# Patient Record
Sex: Male | Born: 1955 | Race: White | Hispanic: No | Marital: Married | Smoking: Former smoker
Health system: Southern US, Community
[De-identification: ages and names within clinical notes are randomized; demographics above are authoritative.]

## PROBLEM LIST (undated history)

## (undated) DIAGNOSIS — E785 Hyperlipidemia, unspecified: Secondary | ICD-10-CM

## (undated) DIAGNOSIS — K219 Gastro-esophageal reflux disease without esophagitis: Secondary | ICD-10-CM

## (undated) DIAGNOSIS — J449 Chronic obstructive pulmonary disease, unspecified: Secondary | ICD-10-CM

## (undated) DIAGNOSIS — J45909 Unspecified asthma, uncomplicated: Secondary | ICD-10-CM

## (undated) DIAGNOSIS — T7840XA Allergy, unspecified, initial encounter: Secondary | ICD-10-CM

## (undated) DIAGNOSIS — I10 Essential (primary) hypertension: Secondary | ICD-10-CM

## (undated) DIAGNOSIS — R519 Headache, unspecified: Secondary | ICD-10-CM

## (undated) HISTORY — DX: Allergy, unspecified, initial encounter: T78.40XA

## (undated) HISTORY — DX: Chronic obstructive pulmonary disease, unspecified: J44.9

## (undated) HISTORY — DX: Hyperlipidemia, unspecified: E78.5

## (undated) HISTORY — PX: OTHER SURGICAL HISTORY: SHX169

## (undated) HISTORY — DX: Essential (primary) hypertension: I10

## (undated) HISTORY — DX: Unspecified asthma, uncomplicated: J45.909

## (undated) HISTORY — PX: EYE SURGERY: SHX253

## (undated) HISTORY — DX: Gastro-esophageal reflux disease without esophagitis: K21.9

---

## 2007-05-11 HISTORY — PX: OTHER SURGICAL HISTORY: SHX169

## 2017-08-31 LAB — COLOGUARD: Cologuard: NEGATIVE

## 2019-05-29 ENCOUNTER — Ambulatory Visit: Payer: Self-pay | Admitting: Family Medicine

## 2019-06-06 ENCOUNTER — Ambulatory Visit: Payer: Self-pay | Admitting: Family Medicine

## 2019-06-18 ENCOUNTER — Other Ambulatory Visit: Payer: Self-pay

## 2019-06-18 ENCOUNTER — Encounter: Payer: Self-pay | Admitting: Family Medicine

## 2019-06-18 ENCOUNTER — Ambulatory Visit (INDEPENDENT_AMBULATORY_CARE_PROVIDER_SITE_OTHER): Payer: BC Managed Care – PPO | Admitting: Family Medicine

## 2019-06-18 VITALS — BP 169/94 | HR 80 | Temp 98.0°F | Ht 68.5 in | Wt 222.0 lb

## 2019-06-18 DIAGNOSIS — J449 Chronic obstructive pulmonary disease, unspecified: Secondary | ICD-10-CM | POA: Insufficient documentation

## 2019-06-18 DIAGNOSIS — J441 Chronic obstructive pulmonary disease with (acute) exacerbation: Secondary | ICD-10-CM | POA: Diagnosis not present

## 2019-06-18 DIAGNOSIS — K219 Gastro-esophageal reflux disease without esophagitis: Secondary | ICD-10-CM | POA: Diagnosis not present

## 2019-06-18 DIAGNOSIS — E782 Mixed hyperlipidemia: Secondary | ICD-10-CM

## 2019-06-18 DIAGNOSIS — E785 Hyperlipidemia, unspecified: Secondary | ICD-10-CM | POA: Insufficient documentation

## 2019-06-18 DIAGNOSIS — Z7689 Persons encountering health services in other specified circumstances: Secondary | ICD-10-CM | POA: Diagnosis not present

## 2019-06-18 DIAGNOSIS — J45909 Unspecified asthma, uncomplicated: Secondary | ICD-10-CM | POA: Insufficient documentation

## 2019-06-18 DIAGNOSIS — I10 Essential (primary) hypertension: Secondary | ICD-10-CM | POA: Diagnosis not present

## 2019-06-18 DIAGNOSIS — J4540 Moderate persistent asthma, uncomplicated: Secondary | ICD-10-CM

## 2019-06-18 NOTE — Assessment & Plan Note (Signed)
Stable and well controlled, continue current regimen 

## 2019-06-18 NOTE — Assessment & Plan Note (Signed)
Stable, prilosec controlling sxs well. Continue current regimen

## 2019-06-18 NOTE — Assessment & Plan Note (Signed)
BPs not at goal today, will start close home monitoring for the next 2 weeks and pt to call or send mychart message with readings in 2 weeks. Will adjust if persistently elevated. Continue current regimen in meantime and discussed DASH diet, stress reduction, increased exercise

## 2019-06-18 NOTE — Progress Notes (Signed)
BP (!) 169/94   Pulse 80   Temp 98 F (36.7 C) (Oral)   Ht 5' 8.5" (1.74 m)   Wt 222 lb (100.7 kg)   SpO2 98%   BMI 33.26 kg/m    Subjective:    Patient ID: Charles Bryant, male    DOB: 11-27-1955, 64 y.o.   MRN: [id number]  HPI: Charles Bryant is a 64 y.o. male  Chief Complaint  Patient presents with  . Establish Care   Patient presenting today to establish care.   Taking medications faithfully without side effects. Has not been checking home BPs but notes typically his BPs in clinic are normal. Denies CP, SOB, HAs, dizziness. Taking lipitor for HLD without issue. Denies claudication, myalgias. Trying to eat well and stay active. Prilosec managing GERD sxs well. COPD/asthma controlled on advair and prn albuterol. No recent exacerbations. Last labs were about 9 months ago. No new concerns today.   Relevant past medical, surgical, family and social history reviewed and updated as indicated. Interim medical history since our last visit reviewed. Allergies and medications reviewed and updated.  Review of Systems  Per HPI unless specifically indicated above     Objective:    BP (!) 169/94   Pulse 80   Temp 98 F (36.7 C) (Oral)   Ht 5' 8.5" (1.74 m)   Wt 222 lb (100.7 kg)   SpO2 98%   BMI 33.26 kg/m   Wt Readings from Last 3 Encounters:  06/18/19 222 lb (100.7 kg)    Physical Exam Vitals and nursing note reviewed.  Constitutional:      Appearance: Normal appearance.  HENT:     Head: Atraumatic.  Eyes:     Extraocular Movements: Extraocular movements intact.     Conjunctiva/sclera: Conjunctivae normal.  Cardiovascular:     Rate and Rhythm: Normal rate and regular rhythm.  Pulmonary:     Effort: Pulmonary effort is normal.     Breath sounds: Normal breath sounds.  Musculoskeletal:        General: Normal range of motion.     Cervical back: Normal range of motion and neck supple.  Skin:    General: Skin is warm and dry.  Neurological:     General: No focal deficit  present.     Mental Status: He is oriented to person, place, and time.  Psychiatric:        Mood and Affect: Mood normal.        Thought Content: Thought content normal.        Judgment: Judgment normal.     No results found for this or any previous visit.    Assessment & Plan:   Problem List Items Addressed This Visit      Cardiovascular and Mediastinum   Essential hypertension    BPs not at goal today, will start close home monitoring for the next 2 weeks and pt to call or send mychart message with readings in 2 weeks. Will adjust if persistently elevated. Continue current regimen in meantime and discussed DASH diet, stress reduction, increased exercise      Relevant Medications   metoprolol succinate (TOPROL-XL) 50 MG 24 hr tablet   atorvastatin (LIPITOR) 10 MG tablet   quinapril (ACCUPRIL) 20 MG tablet   hydrochlorothiazide (HYDRODIURIL) 12.5 MG tablet   Other Relevant Orders   Comprehensive metabolic panel     Respiratory   COPD (chronic obstructive pulmonary disease) (HCC)    Stable and well controlled, continue current regimen  Relevant Medications   Fluticasone-Salmeterol (ADVAIR) 500-50 MCG/DOSE AEPB   albuterol (PROAIR HFA) 108 (90 Base) MCG/ACT inhaler   Asthma    Stable and well controlled, continue current regimen      Relevant Medications   Fluticasone-Salmeterol (ADVAIR) 500-50 MCG/DOSE AEPB   albuterol (PROAIR HFA) 108 (90 Base) MCG/ACT inhaler     Digestive   GERD (gastroesophageal reflux disease)    Stable, prilosec controlling sxs well. Continue current regimen      Relevant Medications   omeprazole (PRILOSEC) 40 MG capsule     Other   Hyperlipidemia - Primary    Recheck lipids today, adjust as needed. Continue working on good lifestyle habits for increased control      Relevant Medications   metoprolol succinate (TOPROL-XL) 50 MG 24 hr tablet   atorvastatin (LIPITOR) 10 MG tablet   quinapril (ACCUPRIL) 20 MG tablet    hydrochlorothiazide (HYDRODIURIL) 12.5 MG tablet   Other Relevant Orders   Lipid Panel w/o Chol/HDL Ratio    Other Visit Diagnoses    Encounter to establish care           Follow up plan: Return in about 6 months (around 12/16/2019) for CPE.

## 2019-06-18 NOTE — Assessment & Plan Note (Signed)
Recheck lipids today, adjust as needed. Continue working on good lifestyle habits for increased control

## 2019-06-19 LAB — COMPREHENSIVE METABOLIC PANEL
ALT: 51 IU/L — ABNORMAL HIGH (ref 0–44)
AST: 37 IU/L (ref 0–40)
Albumin/Globulin Ratio: 1.8 (ref 1.2–2.2)
Albumin: 4.2 g/dL (ref 3.8–4.8)
Alkaline Phosphatase: 104 IU/L (ref 39–117)
BUN/Creatinine Ratio: 17 (ref 10–24)
BUN: 13 mg/dL (ref 8–27)
Bilirubin Total: <0.2 mg/dL (ref 0.0–1.2)
CO2: 26 mmol/L (ref 20–29)
Calcium: 9.7 mg/dL (ref 8.6–10.2)
Chloride: 100 mmol/L (ref 96–106)
Creatinine, Ser: 0.77 mg/dL (ref 0.76–1.27)
GFR calc Af Amer: 110 mL/min/{1.73_m2} (ref >59)
GFR calc non Af Amer: 95 mL/min/{1.73_m2} (ref >59)
Globulin, Total: 2.4 g/dL (ref 1.5–4.5)
Glucose: 112 mg/dL — ABNORMAL HIGH (ref 65–99)
Potassium: 4.2 mmol/L (ref 3.5–5.2)
Sodium: 142 mmol/L (ref 134–144)
Total Protein: 6.6 g/dL (ref 6.0–8.5)

## 2019-06-19 LAB — LIPID PANEL W/O CHOL/HDL RATIO
Cholesterol, Total: 184 mg/dL (ref 100–199)
HDL: 85 mg/dL (ref >39)
LDL Chol Calc (NIH): 85 mg/dL (ref 0–99)
Triglycerides: 79 mg/dL (ref 0–149)
VLDL Cholesterol Cal: 14 mg/dL (ref 5–40)

## 2019-06-29 ENCOUNTER — Encounter: Payer: Self-pay | Admitting: Family Medicine

## 2019-09-04 ENCOUNTER — Other Ambulatory Visit: Payer: Self-pay | Admitting: Family Medicine

## 2019-09-04 ENCOUNTER — Encounter: Payer: Self-pay | Admitting: Family Medicine

## 2019-09-04 MED ORDER — QUINAPRIL HCL 20 MG PO TABS: 20.0000 mg | ORAL_TABLET | Freq: Every day | ORAL | 1 refills | Status: DC

## 2019-09-04 MED ORDER — HYDROCHLOROTHIAZIDE 12.5 MG PO TABS: 12.5000 mg | ORAL_TABLET | Freq: Every day | ORAL | 1 refills | Status: DC

## 2019-09-04 MED ORDER — METOPROLOL SUCCINATE ER 50 MG PO TB24: 50.0000 mg | ORAL_TABLET | Freq: Every day | ORAL | 1 refills | Status: DC

## 2019-10-19 ENCOUNTER — Encounter: Payer: Self-pay | Admitting: Family Medicine

## 2019-12-17 ENCOUNTER — Encounter: Payer: BC Managed Care – PPO | Admitting: Family Medicine

## 2019-12-18 ENCOUNTER — Other Ambulatory Visit: Payer: Self-pay

## 2019-12-18 ENCOUNTER — Encounter: Payer: Self-pay | Admitting: Family Medicine

## 2019-12-18 ENCOUNTER — Ambulatory Visit (INDEPENDENT_AMBULATORY_CARE_PROVIDER_SITE_OTHER): Payer: Medicare Other | Admitting: Family Medicine

## 2019-12-18 VITALS — BP 140/86 | HR 88 | Temp 98.6°F | Ht 68.5 in | Wt 211.0 lb

## 2019-12-18 DIAGNOSIS — I10 Essential (primary) hypertension: Secondary | ICD-10-CM

## 2019-12-18 DIAGNOSIS — E782 Mixed hyperlipidemia: Secondary | ICD-10-CM | POA: Diagnosis not present

## 2019-12-18 DIAGNOSIS — K219 Gastro-esophageal reflux disease without esophagitis: Secondary | ICD-10-CM | POA: Diagnosis not present

## 2019-12-18 DIAGNOSIS — Z1211 Encounter for screening for malignant neoplasm of colon: Secondary | ICD-10-CM

## 2019-12-18 DIAGNOSIS — J441 Chronic obstructive pulmonary disease with (acute) exacerbation: Secondary | ICD-10-CM | POA: Diagnosis not present

## 2019-12-18 DIAGNOSIS — Z0000 Encounter for general adult medical examination without abnormal findings: Secondary | ICD-10-CM

## 2019-12-18 DIAGNOSIS — Z23 Encounter for immunization: Secondary | ICD-10-CM

## 2019-12-18 DIAGNOSIS — Z1159 Encounter for screening for other viral diseases: Secondary | ICD-10-CM

## 2019-12-18 DIAGNOSIS — J4540 Moderate persistent asthma, uncomplicated: Secondary | ICD-10-CM

## 2019-12-18 DIAGNOSIS — Z125 Encounter for screening for malignant neoplasm of prostate: Secondary | ICD-10-CM

## 2019-12-18 MED ORDER — ATORVASTATIN CALCIUM 10 MG PO TABS: 10.0000 mg | ORAL_TABLET | Freq: Every day | ORAL | 1 refills | Status: DC

## 2019-12-18 MED ORDER — FLUTICASONE-SALMETEROL 500-50 MCG/DOSE IN AEPB: 1.0000 | INHALATION_SPRAY | Freq: Two times a day (BID) | RESPIRATORY_TRACT | 5 refills | Status: DC

## 2019-12-18 MED ORDER — QUINAPRIL HCL 20 MG PO TABS: 20.0000 mg | ORAL_TABLET | Freq: Every day | ORAL | 1 refills | Status: DC

## 2019-12-18 MED ORDER — OMEPRAZOLE 40 MG PO CPDR: 40.0000 mg | DELAYED_RELEASE_CAPSULE | Freq: Every day | ORAL | 1 refills | Status: DC

## 2019-12-18 MED ORDER — TADALAFIL 20 MG PO TABS: 20.0000 mg | ORAL_TABLET | Freq: Every day | ORAL | 5 refills | Status: DC | PRN

## 2019-12-18 MED ORDER — ALBUTEROL SULFATE HFA 108 (90 BASE) MCG/ACT IN AERS: 1.0000 | INHALATION_SPRAY | Freq: Two times a day (BID) | RESPIRATORY_TRACT | 0 refills | Status: DC

## 2019-12-18 MED ORDER — HYDROCHLOROTHIAZIDE 12.5 MG PO TABS: 12.5000 mg | ORAL_TABLET | Freq: Every day | ORAL | 1 refills | Status: DC

## 2019-12-18 MED ORDER — METOPROLOL SUCCINATE ER 50 MG PO TB24: 50.0000 mg | ORAL_TABLET | Freq: Every day | ORAL | 1 refills | Status: DC

## 2019-12-18 NOTE — Progress Notes (Signed)
BP 140/86   Pulse 88   Temp 98.6 F (37 C) (Oral)   Ht 5' 8.5" (1.74 m)   Wt 211 lb (95.7 kg)   SpO2 95%   BMI 31.62 kg/m    Subjective:    Patient ID: Charles Bryant, male    DOB: 05/11/1955, 63 y.o.   MRN: [id number]  HPI: Charles Bryant is a 64 y.o. male presenting on 12/18/2019 for comprehensive medical examination. Current medical complaints include:see below  HTN - On quinapril, metoprolol and HCTZ regimen, checks bps occasionally at home and typically below 140s/90s range. Denies CP, SOB, HAs, dizziness. Tries to eat low sodium.   HLD - Taking lipitor, denies myalgias, CP, SOB, claudication.   Asthma, COPD - on advair and albuterol regimen which has worked well. No recent exacerbations, wheezing, CP, SOB.   ED - cialis as needed working well, no concerns  He currently lives with: Interim Problems from his last visit: no  Depression Screen done today and results listed below:  Depression screen [facility] 2/9 12/18/2019 06/18/2019  Decreased Interest 0 0  Down, Depressed, Hopeless 0 0  PHQ - 2 Score 0 0  Altered sleeping 2 1  Tired, decreased energy 0 0  Change in appetite 0 0  Feeling bad or failure about yourself  0 0  Trouble concentrating 0 0  Moving slowly or fidgety/restless 0 0  Suicidal thoughts 0 0  PHQ-9 Score 2 1    The patient does not have a history of falls. I did complete a risk assessment for falls. A plan of care for falls was documented.   Past Medical History:  Past Medical History:  Diagnosis Date  . Asthma   . COPD (chronic obstructive pulmonary disease) (HCC)   . GERD (gastroesophageal reflux disease)   . Hyperlipidemia   . Hypertension     Surgical History:  Past Surgical History:  Procedure Laterality Date  . carotid surgery      Medications:  No current outpatient medications on file prior to visit.   No current facility-administered medications on file prior to visit.    Allergies:  No Known Allergies  Social History:  Social  History   Socioeconomic History  . Marital status: Married    Spouse name: Not on file  . Number of children: Not on file  . Years of education: Not on file  . Highest education level: Not on file  Occupational History  . Not on file  Tobacco Use  . Smoking status: Former Games developer  . Smokeless tobacco: Never Used  Vaping Use  . Vaping Use: Never used  Substance and Sexual Activity  . Alcohol use: Not Currently  . Drug use: Never  . Sexual activity: Yes  Other Topics Concern  . Not on file  Social History Narrative  . Not on file   Social Determinants of Health   Financial Resource Strain:   . Difficulty of Paying Living Expenses:   Food Insecurity:   . Worried About Programme researcher, broadcasting/film/video in the Last Year:   . Barista in the Last Year:   Transportation Needs:   . Freight forwarder (Medical):   [name] Lack of Transportation (Non-Medical):   Physical Activity:   . Days of Exercise per Week:   . Minutes of Exercise per Session:   Stress:   . Feeling of Stress :   Social Connections:   . Frequency of Communication with Friends and Family:   . Frequency  of Social Gatherings with Friends and Family:   . Attends Religious Services:   . Active Member of Clubs or Organizations:   . Attends Banker Meetings:   [name] Marital Status:   Intimate Partner Violence:   . Fear of Current or Ex-Partner:   . Emotionally Abused:   [name] Physically Abused:   . Sexually Abused:    Social History   Tobacco Use  Smoking Status Former Smoker  Smokeless Tobacco Never Used   Social History   Substance and Sexual Activity  Alcohol Use Not Currently    Family History:  Family History  Problem Relation Age of Onset  . COPD Mother   . Cancer Father   . Stroke Maternal Grandfather   . Hyperkalemia Maternal Grandfather     Past medical history, surgical history, medications, allergies, family history and social history reviewed with patient today and changes made to  appropriate areas of the chart.   Review of Systems - General ROS: negative Psychological ROS: negative Ophthalmic ROS: negative ENT ROS: negative Allergy and Immunology ROS: negative Hematological and Lymphatic ROS: negative Endocrine ROS: negative Breast ROS: negative for breast lumps Respiratory ROS: no cough, shortness of breath, or wheezing Cardiovascular ROS: no chest pain or dyspnea on exertion Gastrointestinal ROS: no abdominal pain, change in bowel habits, or black or bloody stools Genito-Urinary ROS: no dysuria, trouble voiding, or hematuria Musculoskeletal ROS: negative Neurological ROS: no TIA or stroke symptoms Dermatological ROS: negative All other ROS negative except what is listed above and in the HPI.      Objective:    BP 140/86   Pulse 88   Temp 98.6 F (37 C) (Oral)   Ht 5' 8.5" (1.74 m)   Wt 211 lb (95.7 kg)   SpO2 95%   BMI 31.62 kg/m   Wt Readings from Last 3 Encounters:  12/18/19 211 lb (95.7 kg)  06/18/19 222 lb (100.7 kg)    Physical Exam Vitals and nursing note reviewed.  Constitutional:      General: He is not in acute distress.    Appearance: He is well-developed.  HENT:     Head: Atraumatic.     Right Ear: Tympanic membrane and external ear normal.     Left Ear: Tympanic membrane and external ear normal.     Nose: Nose normal.     Mouth/Throat:     Mouth: Mucous membranes are moist.     Pharynx: Oropharynx is clear.  Eyes:     General: No scleral icterus.    Conjunctiva/sclera: Conjunctivae normal.     Pupils: Pupils are equal, round, and reactive to light.  Cardiovascular:     Rate and Rhythm: Normal rate and regular rhythm.     Heart sounds: Normal heart sounds. No murmur heard.   Pulmonary:     Effort: Pulmonary effort is normal. No respiratory distress.     Breath sounds: Normal breath sounds.  Abdominal:     General: Bowel sounds are normal. There is no distension.     Palpations: Abdomen is soft. There is no mass.      Tenderness: There is no abdominal tenderness. There is no guarding.  Genitourinary:    Comments: GU exam declined with shared decision making Musculoskeletal:        General: No tenderness. Normal range of motion.     Cervical back: Normal range of motion and neck supple.  Skin:    General: Skin is warm and dry.  Findings: No rash.  Neurological:     General: No focal deficit present.     Mental Status: He is alert and oriented to person, place, and time.     Deep Tendon Reflexes: Reflexes are normal and symmetric.  Psychiatric:        Mood and Affect: Mood normal.        Behavior: Behavior normal.        Thought Content: Thought content normal.        Judgment: Judgment normal.     Results for orders placed or performed in visit on 12/18/19  Hepatitis C antibody  Result Value Ref Range   Hep C Virus Ab <0.1 0.0 - 0.9 s/co ratio  CBC with Differential/Platelet  Result Value Ref Range   WBC 5.6 3.4 - 10.8 x10E3/uL   RBC 4.57 4.14 - 5.80 x10E6/uL   Hemoglobin 14.4 13.0 - 17.7 g/dL   Hematocrit 11.9 14.7 - 51.0 %   MCV 94 79 - 97 fL   MCH 31.5 26.6 - 33.0 pg   MCHC 33.5 31 - 35 g/dL   RDW 82.9 56.2 - 13.0 %   Platelets 185 150 - 450 x10E3/uL   Neutrophils 56 Not Estab. %   Lymphs 28 Not Estab. %   Monocytes 9 Not Estab. %   Eos 5 Not Estab. %   Basos 1 Not Estab. %   Neutrophils Absolute 3.2 1 - 7 x10E3/uL   Lymphocytes Absolute 1.6 0 - 3 x10E3/uL   Monocytes Absolute 0.5 0 - 0 x10E3/uL   EOS (ABSOLUTE) 0.3 0.0 - 0.4 x10E3/uL   Basophils Absolute 0.1 0 - 0 x10E3/uL   Immature Granulocytes 1 Not Estab. %   Immature Grans (Abs) 0.0 0.0 - 0.1 x10E3/uL  Comprehensive metabolic panel  Result Value Ref Range   Glucose 107 (H) 65 - 99 mg/dL   BUN 12 8 - 27 mg/dL   Creatinine, Ser 8.65 0.76 - 1.27 mg/dL   GFR calc non Af Amer 93 >59 mL/min/1.73   GFR calc Af Amer 107 >59 mL/min/1.73   BUN/Creatinine Ratio 15 10 - 24   Sodium 140 134 - 144 mmol/L   Potassium 4.4 3.5 -  5.2 mmol/L   Chloride 98 96 - 106 mmol/L   CO2 26 20 - 29 mmol/L   Calcium 9.4 8.6 - 10.2 mg/dL   Total Protein 6.9 6.0 - 8.5 g/dL   Albumin 4.5 3.8 - 4.8 g/dL   Globulin, Total 2.4 1.5 - 4.5 g/dL   Albumin/Globulin Ratio 1.9 1.2 - 2.2   Bilirubin Total 0.5 0.0 - 1.2 mg/dL   Alkaline Phosphatase 96 48 - 121 IU/L   AST 43 (H) 0 - 40 IU/L   ALT 52 (H) 0 - 44 IU/L  Lipid Panel w/o Chol/HDL Ratio  Result Value Ref Range   Cholesterol, Total 200 (H) 100 - 199 mg/dL   Triglycerides 56 0 - 149 mg/dL   HDL 784 >69 mg/dL   VLDL Cholesterol Cal 10 5 - 40 mg/dL   LDL Chol Calc (NIH) 87 0 - 99 mg/dL  PSA  Result Value Ref Range   Prostate Specific Ag, Serum 0.9 0.0 - 4.0 ng/mL      Assessment & Plan:   Problem List Items Addressed This Visit      Cardiovascular and Mediastinum   Essential hypertension    BPs stable and under good control, continue prsent medications      Relevant Medications   tadalafil (CIALIS)  20 MG tablet   quinapril (ACCUPRIL) 20 MG tablet   metoprolol succinate (TOPROL-XL) 50 MG 24 hr tablet   hydrochlorothiazide (HYDRODIURIL) 12.5 MG tablet   atorvastatin (LIPITOR) 10 MG tablet   Other Relevant Orders   CBC with Differential/Platelet (Completed)   Comprehensive metabolic panel (Completed)     Respiratory   COPD (chronic obstructive pulmonary disease) (HCC)    Stable, continue inhaler regimen      Relevant Medications   Fluticasone-Salmeterol (ADVAIR) 500-50 MCG/DOSE AEPB   albuterol (PROAIR HFA) 108 (90 Base) MCG/ACT inhaler   Asthma    Stable, continue inhaler regimen      Relevant Medications   Fluticasone-Salmeterol (ADVAIR) 500-50 MCG/DOSE AEPB   albuterol (PROAIR HFA) 108 (90 Base) MCG/ACT inhaler     Digestive   GERD (gastroesophageal reflux disease)    Stable on PPI, continue present medications      Relevant Medications   omeprazole (PRILOSEC) 40 MG capsule     Other   Hyperlipidemia - Primary    Recheck lipids, adjust as needed.  Continue present medication       Relevant Medications   tadalafil (CIALIS) 20 MG tablet   quinapril (ACCUPRIL) 20 MG tablet   metoprolol succinate (TOPROL-XL) 50 MG 24 hr tablet   hydrochlorothiazide (HYDRODIURIL) 12.5 MG tablet   atorvastatin (LIPITOR) 10 MG tablet   Other Relevant Orders   Lipid Panel w/o Chol/HDL Ratio (Completed)    Other Visit Diagnoses    Annual physical exam       Screening for prostate cancer       Relevant Orders   PSA (Completed)   Need for pneumococcal vaccine       Relevant Orders   Pneumococcal conjugate vaccine 13-valent IM (Completed)   Screening for colon cancer       Relevant Orders   Cologuard   Need for hepatitis C screening test       Relevant Orders   Hepatitis C antibody (Completed)       Discussed aspirin prophylaxis for myocardial infarction prevention and decision was made to continue ASA  LABORATORY TESTING:  Health maintenance labs ordered today as discussed above.   The natural history of prostate cancer and ongoing controversy regarding screening and potential treatment outcomes of prostate cancer has been discussed with the patient. The meaning of a false positive PSA and a false negative PSA has been discussed. He indicates understanding of the limitations of this screening test and wishes to proceed with screening PSA testing.   IMMUNIZATIONS:   - Tdap: Tetanus vaccination status reviewed: postponed. - Influenza: Postponed to flu season - Pneumovax: Up to date - Prevnar: Up to date - HPV: Not applicable - Zostavax vaccine: Refused  SCREENING: - Colonoscopy: cologuard UTD  Discussed with patient purpose of the colonoscopy is to detect colon cancer at curable precancerous or early stages   PATIENT COUNSELING:    Sexuality: Discussed sexually transmitted diseases, partner selection, use of condoms, avoidance of unintended pregnancy  and contraceptive alternatives.   Advised to avoid cigarette smoking.  I discussed  with the patient that most people either abstain from alcohol or drink within safe limits (<=14/week and <=4 drinks/occasion for males, <=7/weeks and <= 3 drinks/occasion for females) and that the risk for alcohol disorders and other health effects rises proportionally with the number of drinks per week and how often a drinker exceeds daily limits.  Discussed cessation/primary prevention of drug use and availability of treatment for abuse.   Diet: Encouraged  to adjust caloric intake to maintain  or achieve ideal body weight, to reduce intake of dietary saturated fat and total fat, to limit sodium intake by avoiding high sodium foods and not adding table salt, and to maintain adequate dietary potassium and calcium preferably from fresh fruits, vegetables, and low-fat dairy products.    stressed the importance of regular exercise  Injury prevention: Discussed safety belts, safety helmets, smoke detector, smoking near bedding or upholstery.   Dental health: Discussed importance of regular tooth brushing, flossing, and dental visits.   Follow up plan: NEXT PREVENTATIVE PHYSICAL DUE IN 1 YEAR. Return in about 6 months (around 06/19/2020) for 6 month f/u.

## 2019-12-19 LAB — CBC WITH DIFFERENTIAL/PLATELET
Basophils Absolute: 0.1 10*3/uL (ref 0.0–0.2)
Basos: 1 %
EOS (ABSOLUTE): 0.3 10*3/uL (ref 0.0–0.4)
Eos: 5 %
Hematocrit: 43 % (ref 37.5–51.0)
Hemoglobin: 14.4 g/dL (ref 13.0–17.7)
Immature Grans (Abs): 0 10*3/uL (ref 0.0–0.1)
Immature Granulocytes: 1 %
Lymphocytes Absolute: 1.6 10*3/uL (ref 0.7–3.1)
Lymphs: 28 %
MCH: 31.5 pg (ref 26.6–33.0)
MCHC: 33.5 g/dL (ref 31.5–35.7)
MCV: 94 fL (ref 79–97)
Monocytes Absolute: 0.5 10*3/uL (ref 0.1–0.9)
Monocytes: 9 %
Neutrophils Absolute: 3.2 10*3/uL (ref 1.4–7.0)
Neutrophils: 56 %
Platelets: 185 10*3/uL (ref 150–450)
RBC: 4.57 x10E6/uL (ref 4.14–5.80)
RDW: 13.5 % (ref 11.6–15.4)
WBC: 5.6 10*3/uL (ref 3.4–10.8)

## 2019-12-19 LAB — COMPREHENSIVE METABOLIC PANEL
ALT: 52 IU/L — ABNORMAL HIGH (ref 0–44)
AST: 43 IU/L — ABNORMAL HIGH (ref 0–40)
Albumin/Globulin Ratio: 1.9 (ref 1.2–2.2)
Albumin: 4.5 g/dL (ref 3.8–4.8)
Alkaline Phosphatase: 96 IU/L (ref 48–121)
BUN/Creatinine Ratio: 15 (ref 10–24)
BUN: 12 mg/dL (ref 8–27)
Bilirubin Total: 0.5 mg/dL (ref 0.0–1.2)
CO2: 26 mmol/L (ref 20–29)
Calcium: 9.4 mg/dL (ref 8.6–10.2)
Chloride: 98 mmol/L (ref 96–106)
Creatinine, Ser: 0.81 mg/dL (ref 0.76–1.27)
GFR calc Af Amer: 107 mL/min/{1.73_m2} (ref >59)
GFR calc non Af Amer: 93 mL/min/{1.73_m2} (ref >59)
Globulin, Total: 2.4 g/dL (ref 1.5–4.5)
Glucose: 107 mg/dL — ABNORMAL HIGH (ref 65–99)
Potassium: 4.4 mmol/L (ref 3.5–5.2)
Sodium: 140 mmol/L (ref 134–144)
Total Protein: 6.9 g/dL (ref 6.0–8.5)

## 2019-12-19 LAB — HEPATITIS C ANTIBODY: Hep C Virus Ab: <0.1 s/co ratio (ref 0.0–0.9)

## 2019-12-19 LAB — LIPID PANEL W/O CHOL/HDL RATIO
Cholesterol, Total: 200 mg/dL — ABNORMAL HIGH (ref 100–199)
HDL: 103 mg/dL (ref >39)
LDL Chol Calc (NIH): 87 mg/dL (ref 0–99)
Triglycerides: 56 mg/dL (ref 0–149)
VLDL Cholesterol Cal: 10 mg/dL (ref 5–40)

## 2019-12-19 LAB — PSA: Prostate Specific Ag, Serum: 0.9 ng/mL (ref 0.0–4.0)

## 2019-12-23 NOTE — Assessment & Plan Note (Signed)
Stable on PPI, continue present medications. 

## 2019-12-23 NOTE — Assessment & Plan Note (Signed)
Stable, continue inhaler regimen 

## 2019-12-23 NOTE — Assessment & Plan Note (Signed)
BPs stable and under good control, continue prsent medications

## 2019-12-23 NOTE — Assessment & Plan Note (Signed)
Recheck lipids, adjust as needed. Continue present medication. 

## 2019-12-31 ENCOUNTER — Encounter: Payer: Self-pay | Admitting: Family Medicine

## 2020-02-18 ENCOUNTER — Other Ambulatory Visit: Payer: Self-pay | Admitting: Family Medicine

## 2020-04-25 ENCOUNTER — Other Ambulatory Visit: Payer: Self-pay | Admitting: Family Medicine

## 2020-05-23 ENCOUNTER — Telehealth: Payer: Self-pay

## 2020-05-23 ENCOUNTER — Ambulatory Visit (INDEPENDENT_AMBULATORY_CARE_PROVIDER_SITE_OTHER): Payer: Medicare Other

## 2020-05-23 VITALS — Ht 69.5 in | Wt 207.0 lb

## 2020-05-23 DIAGNOSIS — Z0000 Encounter for general adult medical examination without abnormal findings: Secondary | ICD-10-CM | POA: Diagnosis not present

## 2020-05-23 NOTE — Progress Notes (Signed)
I connected with  Charles Bryant today via telehealth video enabled device and verified that I am speaking with the correct person using two identifiers.   Location: Patient: home Provider: home  Persons participating in virtual visit: patient, provider  I discussed the limitations, risks, security and privacy concerns of performing an evaluation and management service by telephone and the availability of in person appointments. The patient expressed understanding and agreed to proceed.   Some vital signs may be absent or patient reported.       Subjective:   Charles Bryant is a 65 y.o. male who presents for an Initial Medicare Annual Wellness Visit.  Review of Systems     Cardiac Risk Factors include: advanced age (>46men, >38 women);dyslipidemia;hypertension;male gender;obesity (BMI >30kg/m2);sedentary lifestyle     Objective:    Today's Vitals   05/23/20 1301  Weight: 207 lb (93.9 kg)  Height: 5' 9.5" (1.765 m)   Body mass index is 30.13 kg/m.  Advanced Directives 05/23/2020  Does Patient Have a Medical Advance Directive? Yes  Type of Estate agent of [place];Living will  Copy of Healthcare Power of Attorney in Chart? No - copy requested    Current Medications (verified) Outpatient Encounter Medications as of 05/23/2020  Medication Sig  . albuterol (VENTOLIN HFA) 108 (90 Base) MCG/ACT inhaler INHALE 1 TO 2 INHALATIONS  BY MOUTH INTO THE LUNGS  TWICE DAILY  . atorvastatin (LIPITOR) 10 MG tablet TAKE 1 TABLET BY MOUTH  DAILY  . hydrochlorothiazide (HYDRODIURIL) 12.5 MG tablet Take 1 tablet (12.5 mg total) by mouth daily.  . metoprolol succinate (TOPROL-XL) 50 MG 24 hr tablet Take 1 tablet (50 mg total) by mouth daily. Take with or immediately following a meal.  . omeprazole (PRILOSEC) 40 MG capsule TAKE 1 CAPSULE BY MOUTH  DAILY  . quinapril (ACCUPRIL) 20 MG tablet Take 1 tablet (20 mg total) by mouth at bedtime.  . tadalafil (CIALIS) 20 MG tablet Take 1  tablet (20 mg total) by mouth daily as needed for erectile dysfunction.  Monte Fantasia INHUB 500-50 MCG/DOSE AEPB USE 1 INHALATION BY MOUTH  TWICE DAILY   No facility-administered encounter medications on file as of 05/23/2020.    Allergies (verified) Patient has no known allergies.   History: Past Medical History:  Diagnosis Date  . Asthma   . COPD (chronic obstructive pulmonary disease) (HCC)   . GERD (gastroesophageal reflux disease)   . Hyperlipidemia   . Hypertension    Past Surgical History:  Procedure Laterality Date  . carotid surgery     Family History  Problem Relation Age of Onset  . COPD Mother   . Cancer Father   . Stroke Maternal Grandfather   . Hyperkalemia Maternal Grandfather    Social History   Socioeconomic History  . Marital status: Married    Spouse name: Not on file  . Number of children: Not on file  . Years of education: Not on file  . Highest education level: Not on file  Occupational History  . Not on file  Tobacco Use  . Smoking status: Former Games developer  . Smokeless tobacco: Never Used  Vaping Use  . Vaping Use: Never used  Substance and Sexual Activity  . Alcohol use: Not Currently  . Drug use: Never  . Sexual activity: Yes  Other Topics Concern  . Not on file  Social History Narrative  . Not on file   Social Determinants of Health   Financial Resource Strain: Low Risk   .  Difficulty of Paying Living Expenses: Not hard at all  Food Insecurity: No Food Insecurity  . Worried About Programme researcher, broadcasting/film/video in the Last Year: Never true  . Ran Out of Food in the Last Year: Never true  Transportation Needs: No Transportation Needs  . Lack of Transportation (Medical): No  . Lack of Transportation (Non-Medical): No  Physical Activity: Inactive  . Days of Exercise per Week: 0 days  . Minutes of Exercise per Session: 0 min  Stress: No Stress Concern Present  . Feeling of Stress : Not at all  Social Connections: Not on file    Tobacco  Counseling Counseling given: Not Answered   Clinical Intake:  Pre-visit preparation completed: Yes  Pain : No/denies pain     Nutritional Status: BMI > 30  Obese Nutritional Risks: Nausea/ vomitting/ diarrhea (diarrhea 2 days ago) Diabetes: No  How often do you need to have someone help you when you read instructions, pamphlets, or other written materials from your doctor or pharmacy?: 1 - Never What is the last grade level you completed in school?: graduate school  Diabetic? no     Information entered by :: NAllen LPN   Activities of Daily Living In your present state of health, do you have any difficulty performing the following activities: 05/23/2020 12/18/2019  Hearing? N N  Vision? N N  Difficulty concentrating or making decisions? N N  Walking or climbing stairs? N N  Dressing or bathing? N N  Doing errands, shopping? N N  Preparing Food and eating ? N -  Using the Toilet? N -  In the past six months, have you accidently leaked urine? N -  Do you have problems with loss of bowel control? N -  Managing your Medications? N -  Managing your Finances? N -  Housekeeping or managing your Housekeeping? N -    Patient Care Team: Particia Nearing, PA-C as PCP - General (Family Medicine)  Indicate any recent Medical Services you may have received from other than [facility] providers in the past year (date may be approximate).     Assessment:   This is a routine wellness examination for Charles Bryant.  Hearing/Vision screen  Hearing Screening   125Hz  250Hz  500Hz  1000Hz  2000Hz  3000Hz  4000Hz  6000Hz  8000Hz   Right ear:           Left ear:           Vision Screening Comments: Regular eye exams, [facility]  Dietary issues and exercise activities discussed: Current Exercise Habits: The patient does not participate in regular exercise at present  Goals    . Patient Stated     05/23/2020, return to exercise      Depression Screen PHQ 2/9 Scores 05/23/2020 12/18/2019 06/18/2019   PHQ - 2 Score 0 0 0  PHQ- 9 Score - 2 1    Fall Risk Fall Risk  05/23/2020 12/18/2019 06/18/2019  Falls in the past year? 0 0 0  Number falls in past yr: - 0 0  Injury with Fall? - 0 0  Risk for fall due to : Medication side effect - -  Follow up Falls evaluation completed;Education provided;Falls prevention discussed - -    FALL RISK PREVENTION PERTAINING TO THE HOME:  Any stairs in or around the home? Yes  If so, are there any without handrails? No  Home free of loose throw rugs in walkways, pet beds, electrical cords, etc? Yes  Adequate lighting in your home to reduce risk of  falls? Yes   ASSISTIVE DEVICES UTILIZED TO PREVENT FALLS:  Life alert? No  Use of a cane, walker or w/c? No  Grab bars in the bathroom? No  Shower chair or bench in shower? Yes  Elevated toilet seat or a handicapped toilet? No   TIMED UP AND GO:  Was the test performed? No . .    Cognitive Function:     6CIT Screen 05/23/2020  What Year? 0 points  What month? 0 points  What time? 0 points  Count back from 20 0 points  Months in reverse 0 points  Repeat phrase 2 points  Total Score 2    Immunizations Immunization History  Administered Date(s) Administered  . Influenza-Unspecified 02/22/2019  . PFIZER SARS-COV-2 Vaccination 06/15/2019, 07/06/2019, 02/04/2020  . Pneumococcal Conjugate-13 12/18/2019    TDAP status: Due, Education has been provided regarding the importance of this vaccine. Advised may receive this vaccine at local pharmacy or Health Dept. Aware to provide a copy of the vaccination record if obtained from local pharmacy or Health Dept. Verbalized acceptance and understanding.  Flu Vaccine status: Up to date  Pneumococcal vaccine status: Up to date  Covid-19 vaccine status: Completed vaccines  Qualifies for Shingles Vaccine? Yes   Zostavax completed No   Shingrix Completed?: Yes  Screening Tests Health Maintenance  Topic Date Due  . TETANUS/TDAP  Never done  . Fecal  DNA (Cologuard)  08/31/2020  . PNA vac Low Risk Adult (2 of 2 - PPSV23) 12/17/2020  . INFLUENZA VACCINE  Completed  . COVID-19 Vaccine  Completed  . Hepatitis C Screening  Completed    Health Maintenance  Health Maintenance Due  Topic Date Due  . TETANUS/TDAP  Never done    Colorectal cancer screening: Type of screening: Cologuard. Completed 08/31/2017. Repeat every 3 years  Lung Cancer Screening: (Low Dose CT Chest recommended if Age 73-80 years, 30 pack-year currently smoking OR have quit w/in 15years.) does not qualify.   Lung Cancer Screening Referral: no  Additional Screening:  Hepatitis C Screening: does qualify; Completed 12/18/2019  Vision Screening: Recommended annual ophthalmology exams for early detection of glaucoma and other disorders of the eye. Is the patient up to date with their annual eye exam?  Yes  Who is the provider or what is the name of the office in which the patient attends annual eye exams? [facility] If pt is not established with a provider, would they like to be referred to a provider to establish care? No .   Dental Screening: Recommended annual dental exams for proper oral hygiene  Community Resource Referral / Chronic Care Management: CRR required this visit?  No   CCM required this visit?  No      Plan:     I have personally reviewed and noted the following in the patient's chart:   . Medical and social history . Use of alcohol, tobacco or illicit drugs  . Current medications and supplements . Functional ability and status . Nutritional status . Physical activity . Advanced directives . List of other physicians . Hospitalizations, surgeries, and ER visits in previous 12 months . Vitals . Screenings to include cognitive, depression, and falls . Referrals and appointments  In addition, I have reviewed and discussed with patient certain preventive protocols, quality metrics, and best practice recommendations. A written personalized  care plan for preventive services as well as general preventive health recommendations were provided to patient.     Barb Merino, LPN   [id number]/61/[id number]  Nurse Notes:

## 2020-05-23 NOTE — Patient Instructions (Signed)
Charles Bryant , Thank you for taking time to come for your Medicare Wellness Visit. I appreciate your ongoing commitment to your health goals. Please review the following plan we discussed and let me know if I can assist you in the future.   Screening recommendations/referrals: Colonoscopy: cologuard 08/31/2017, due 08/31/2020 Recommended yearly ophthalmology/optometry visit for glaucoma screening and checkup Recommended yearly dental visit for hygiene and checkup  Vaccinations: Influenza vaccine: completed 02/04/2020, due 12/08/2020 Pneumococcal vaccine: completed 12/18/2019, due 12/17/2020 Tdap vaccine: due Shingles vaccine: completed per patient   Covid-19: 02/04/2020, 07/06/2019, 06/15/2019  Advanced directives: Please bring a copy of your POA (Power of Attorney) and/or Living Will to your next appointment.   Conditions/risks identified: none  Next appointment: Follow up in one year for your annual wellness visit.   Preventive Care 67 Years and Older, Male Preventive care refers to lifestyle choices and visits with your health care provider that can promote health and wellness. What does preventive care include?  A yearly physical exam. This is also called an annual well check.  Dental exams once or twice a year.  Routine eye exams. Ask your health care provider how often you should have your eyes checked.  Personal lifestyle choices, including:  Daily care of your teeth and gums.  Regular physical activity.  Eating a healthy diet.  Avoiding tobacco and drug use.  Limiting alcohol use.  Practicing safe sex.  Taking low doses of aspirin every day.  Taking vitamin and mineral supplements as recommended by your health care provider. What happens during an annual well check? The services and screenings done by your health care provider during your annual well check will depend on your age, overall health, lifestyle risk factors, and family history of disease. Counseling  Your  health care provider may ask you questions about your:  Alcohol use.  Tobacco use.  Drug use.  Emotional well-being.  Home and relationship well-being.  Sexual activity.  Eating habits.  History of falls.  Memory and ability to understand (cognition).  Work and work Astronomer. Screening  You may have the following tests or measurements:  Height, weight, and BMI.  Blood pressure.  Lipid and cholesterol levels. These may be checked every 5 years, or more frequently if you are over 51 years old.  Skin check.  Lung cancer screening. You may have this screening every year starting at age 27 if you have a 30-pack-year history of smoking and currently smoke or have quit within the past 15 years.  Fecal occult blood test (FOBT) of the stool. You may have this test every year starting at age 23.  Flexible sigmoidoscopy or colonoscopy. You may have a sigmoidoscopy every 5 years or a colonoscopy every 10 years starting at age 75.  Prostate cancer screening. Recommendations will vary depending on your family history and other risks.  Hepatitis C blood test.  Hepatitis B blood test.  Sexually transmitted disease (STD) testing.  Diabetes screening. This is done by checking your blood sugar (glucose) after you have not eaten for a while (fasting). You may have this done every 1-3 years.  Abdominal aortic aneurysm (AAA) screening. You may need this if you are a current or former smoker.  Osteoporosis. You may be screened starting at age 42 if you are at high risk. Talk with your health care provider about your test results, treatment options, and if necessary, the need for more tests. Vaccines  Your health care provider may recommend certain vaccines, such as:  Influenza  vaccine. This is recommended every year.  Tetanus, diphtheria, and acellular pertussis (Tdap, Td) vaccine. You may need a Td booster every 10 years.  Zoster vaccine. You may need this after age  23.  Pneumococcal 13-valent conjugate (PCV13) vaccine. One dose is recommended after age 1.  Pneumococcal polysaccharide (PPSV23) vaccine. One dose is recommended after age 74. Talk to your health care provider about which screenings and vaccines you need and how often you need them. This information is not intended to replace advice given to you by your health care provider. Make sure you discuss any questions you have with your health care provider. Document Released: 05/23/2015 Document Revised: 01/14/2016 Document Reviewed: 02/25/2015 Elsevier Interactive Patient Education  2017 [place] Prevention in the Home Falls can cause injuries. They can happen to people of all ages. There are many things you can do to make your home safe and to help prevent falls. What can I do on the outside of my home?  Regularly fix the edges of walkways and driveways and fix any cracks.  Remove anything that might make you trip as you walk through a door, such as a raised step or threshold.  Trim any bushes or trees on the path to your home.  Use bright outdoor lighting.  Clear any walking paths of anything that might make someone trip, such as rocks or tools.  Regularly check to see if handrails are loose or broken. Make sure that both sides of any steps have handrails.  Any raised decks and porches should have guardrails on the edges.  Have any leaves, snow, or ice cleared regularly.  Use sand or salt on walking paths during winter.  Clean up any spills in your garage right away. This includes oil or grease spills. What can I do in the bathroom?  Use night lights.  Install grab bars by the toilet and in the tub and shower. Do not use towel bars as grab bars.  Use non-skid mats or decals in the tub or shower.  If you need to sit down in the shower, use a plastic, non-slip stool.  Keep the floor dry. Clean up any water that spills on the floor as soon as it happens.  Remove  soap buildup in the tub or shower regularly.  Attach bath mats securely with double-sided non-slip rug tape.  Do not have throw rugs and other things on the floor that can make you trip. What can I do in the bedroom?  Use night lights.  Make sure that you have a light by your bed that is easy to reach.  Do not use any sheets or blankets that are too big for your bed. They should not hang down onto the floor.  Have a firm chair that has side arms. You can use this for support while you get dressed.  Do not have throw rugs and other things on the floor that can make you trip. What can I do in the kitchen?  Clean up any spills right away.  Avoid walking on wet floors.  Keep items that you use a lot in easy-to-reach places.  If you need to reach something above you, use a strong step stool that has a grab bar.  Keep electrical cords out of the way.  Do not use floor polish or wax that makes floors slippery. If you must use wax, use non-skid floor wax.  Do not have throw rugs and other things on the floor that can  make you trip. What can I do with my stairs?  Do not leave any items on the stairs.  Make sure that there are handrails on both sides of the stairs and use them. Fix handrails that are broken or loose. Make sure that handrails are as long as the stairways.  Check any carpeting to make sure that it is firmly attached to the stairs. Fix any carpet that is loose or worn.  Avoid having throw rugs at the top or bottom of the stairs. If you do have throw rugs, attach them to the floor with carpet tape.  Make sure that you have a light switch at the top of the stairs and the bottom of the stairs. If you do not have them, ask someone to add them for you. What else can I do to help prevent falls?  Wear shoes that:  Do not have high heels.  Have rubber bottoms.  Are comfortable and fit you well.  Are closed at the toe. Do not wear sandals.  If you use a  stepladder:  Make sure that it is fully opened. Do not climb a closed stepladder.  Make sure that both sides of the stepladder are locked into place.  Ask someone to hold it for you, if possible.  Clearly [place] and make sure that you can see:  Any grab bars or handrails.  First and last steps.  Where the edge of each step is.  Use tools that help you move around (mobility aids) if they are needed. These include:  Canes.  Walkers.  Scooters.  Crutches.  Turn on the lights when you go into a dark area. Replace any light bulbs as soon as they burn out.  Set up your furniture so you have a clear path. Avoid moving your furniture around.  If any of your floors are uneven, fix them.  If there are any pets around you, be aware of where they are.  Review your medicines with your doctor. Some medicines can make you feel dizzy. This can increase your chance of falling. Ask your doctor what other things that you can do to help prevent falls. This information is not intended to replace advice given to you by your health care provider. Make sure you discuss any questions you have with your health care provider. Document Released: 02/20/2009 Document Revised: 10/02/2015 Document Reviewed: 05/31/2014 Elsevier Interactive Patient Education  2017 [employer].

## 2020-05-23 NOTE — Telephone Encounter (Signed)
Patient consented to telehealth visit. 

## 2020-06-05 ENCOUNTER — Other Ambulatory Visit: Payer: Self-pay | Admitting: Family Medicine

## 2020-06-19 ENCOUNTER — Ambulatory Visit (INDEPENDENT_AMBULATORY_CARE_PROVIDER_SITE_OTHER): Payer: Medicare Other | Admitting: Nurse Practitioner

## 2020-06-19 ENCOUNTER — Encounter: Payer: Self-pay | Admitting: Nurse Practitioner

## 2020-06-19 ENCOUNTER — Other Ambulatory Visit: Payer: Self-pay

## 2020-06-19 ENCOUNTER — Ambulatory Visit: Payer: Medicare Other | Admitting: Family Medicine

## 2020-06-19 VITALS — BP 154/81 | HR 80 | Temp 98.7°F | Wt 208.2 lb

## 2020-06-19 DIAGNOSIS — I10 Essential (primary) hypertension: Secondary | ICD-10-CM

## 2020-06-19 DIAGNOSIS — J441 Chronic obstructive pulmonary disease with (acute) exacerbation: Secondary | ICD-10-CM | POA: Diagnosis not present

## 2020-06-19 DIAGNOSIS — E782 Mixed hyperlipidemia: Secondary | ICD-10-CM | POA: Diagnosis not present

## 2020-06-19 DIAGNOSIS — K219 Gastro-esophageal reflux disease without esophagitis: Secondary | ICD-10-CM

## 2020-06-19 MED ORDER — METOPROLOL SUCCINATE ER 50 MG PO TB24: ORAL_TABLET | ORAL | 3 refills | Status: DC

## 2020-06-19 MED ORDER — QUINAPRIL HCL 40 MG PO TABS: 40.0000 mg | ORAL_TABLET | Freq: Every day | ORAL | 3 refills | Status: DC

## 2020-06-19 MED ORDER — HYDROCHLOROTHIAZIDE 12.5 MG PO TABS: 12.5000 mg | ORAL_TABLET | Freq: Every day | ORAL | 3 refills | Status: DC

## 2020-06-19 MED ORDER — FLUTICASONE-SALMETEROL 500-50 MCG/DOSE IN AEPB: INHALATION_SPRAY | RESPIRATORY_TRACT | 3 refills | Status: DC

## 2020-06-19 NOTE — Progress Notes (Signed)
BP (!) 154/81   Pulse 80   Temp 98.7 F (37.1 C) (Oral)   Wt 208 lb 3.2 oz (94.4 kg)   SpO2 100%   BMI 30.30 kg/m    Subjective:    Patient ID: Charles Bryant, male    DOB: Mar 20, 1956, 65 y.o.   MRN: [id number]  HPI: Charles Bryant is a 65 y.o. male  Chief Complaint  Patient presents with  . Hyperlipidemia  . Hypertension   HYPERTENSION / [place] Satisfied with current treatment? yes Duration of hypertension: years BP monitoring frequency: weekly BP range: 150s/80s BP medication side effects: no Past BP meds: metoprolol, HCTZ and quinapril Duration of hyperlipidemia: years Cholesterol medication side effects: yes Cholesterol supplements: none Past cholesterol medications: atorvastain (lipitor) Medication compliance: excellent compliance Aspirin: yes Recent stressors: no Recurrent headaches: no Visual changes: no Palpitations: no Dyspnea: no Chest pain: no Lower extremity edema: no Dizzy/lightheaded: no    Relevant past medical, surgical, family and social history reviewed and updated as indicated. Interim medical history since our last visit reviewed. Allergies and medications reviewed and updated.  Review of Systems  Eyes: Negative for visual disturbance.  Respiratory: Negative for shortness of breath.   Cardiovascular: Negative for chest pain and leg swelling.  Neurological: Negative for light-headedness and headaches.    Per HPI unless specifically indicated above     Objective:    BP (!) 154/81   Pulse 80   Temp 98.7 F (37.1 C) (Oral)   Wt 208 lb 3.2 oz (94.4 kg)   SpO2 100%   BMI 30.30 kg/m   Wt Readings from Last 3 Encounters:  06/19/20 208 lb 3.2 oz (94.4 kg)  05/23/20 207 lb (93.9 kg)  12/18/19 211 lb (95.7 kg)    Physical Exam Vitals and nursing note reviewed.  Constitutional:      General: He is not in acute distress.    Appearance: Normal appearance. He is not ill-appearing, toxic-appearing or diaphoretic.  HENT:     Head:  Normocephalic.     Right Ear: External ear normal.     Left Ear: External ear normal.     Nose: Nose normal. No congestion or rhinorrhea.     Mouth/Throat:     Mouth: Mucous membranes are moist.  Eyes:     General:        Right eye: No discharge.        Left eye: No discharge.     Extraocular Movements: Extraocular movements intact.     Conjunctiva/sclera: Conjunctivae normal.     Pupils: Pupils are equal, round, and reactive to light.  Cardiovascular:     Rate and Rhythm: Normal rate and regular rhythm.     Heart sounds: No murmur heard.   Pulmonary:     Effort: Pulmonary effort is normal. No respiratory distress.     Breath sounds: Normal breath sounds. No wheezing, rhonchi or rales.  Abdominal:     General: Abdomen is flat. Bowel sounds are normal.  Musculoskeletal:     Cervical back: Normal range of motion and neck supple.  Skin:    General: Skin is warm and dry.     Capillary Refill: Capillary refill takes less than 2 seconds.  Neurological:     General: No focal deficit present.     Mental Status: He is alert and oriented to person, place, and time.  Psychiatric:        Mood and Affect: Mood normal.        Behavior:  Behavior normal.        Thought Content: Thought content normal.        Judgment: Judgment normal.     Results for orders placed or performed in visit on 12/19/19  Cologuard  Result Value Ref Range   Cologuard Negative Negative      Assessment & Plan:   Problem List Items Addressed This Visit      Cardiovascular and Mediastinum   Essential hypertension    Elevated at visit today and at home.  Increased Quinapril from 44m to 456m  Follow up in one month to reevaluate.      Relevant Medications   hydrochlorothiazide (HYDRODIURIL) 12.5 MG tablet   metoprolol succinate (TOPROL-XL) 50 MG 24 hr tablet   aspirin EC 81 MG tablet   quinapril (ACCUPRIL) 40 MG tablet   Other Relevant Orders   Comp Met (CMET)     Respiratory   COPD (chronic  obstructive pulmonary disease) (HCC) - Primary    Stable, continue inhaler regimen. Refills sent today.       Relevant Medications   Fluticasone-Salmeterol (WIXELA INHUB) 500-50 MCG/DOSE AEPB     Digestive   GERD (gastroesophageal reflux disease)    Stable on PPI, continue present medications.        Other   Hyperlipidemia    Recheck lipids, adjust as needed. Continue present medication.      Relevant Medications   hydrochlorothiazide (HYDRODIURIL) 12.5 MG tablet   metoprolol succinate (TOPROL-XL) 50 MG 24 hr tablet   aspirin EC 81 MG tablet   quinapril (ACCUPRIL) 40 MG tablet   Other Relevant Orders   Comp Met (CMET)   Lipid Profile       Follow up plan: Return in about 1 month (around 07/17/2020) for BP Check.

## 2020-06-19 NOTE — Assessment & Plan Note (Signed)
Elevated at visit today and at home.  Increased Quinapril from 20mg  to 40mg .  Follow up in one month to reevaluate.

## 2020-06-19 NOTE — Assessment & Plan Note (Signed)
Recheck lipids, adjust as needed. Continue present medication.

## 2020-06-19 NOTE — Assessment & Plan Note (Signed)
Stable on PPI, continue present medications.

## 2020-06-19 NOTE — Assessment & Plan Note (Signed)
Stable, continue inhaler regimen. Refills sent today.

## 2020-06-20 ENCOUNTER — Ambulatory Visit: Payer: Medicare Other | Admitting: Nurse Practitioner

## 2020-06-20 LAB — COMPREHENSIVE METABOLIC PANEL
ALT: 39 IU/L (ref 0–44)
AST: 33 IU/L (ref 0–40)
Albumin/Globulin Ratio: 2 (ref 1.2–2.2)
Albumin: 4.6 g/dL (ref 3.8–4.8)
Alkaline Phosphatase: 81 IU/L (ref 44–121)
BUN/Creatinine Ratio: 18 (ref 10–24)
BUN: 14 mg/dL (ref 8–27)
Bilirubin Total: 0.5 mg/dL (ref 0.0–1.2)
CO2: 26 mmol/L (ref 20–29)
Calcium: 9.8 mg/dL (ref 8.6–10.2)
Chloride: 98 mmol/L (ref 96–106)
Creatinine, Ser: 0.76 mg/dL (ref 0.76–1.27)
GFR calc Af Amer: 110 mL/min/{1.73_m2} (ref >59)
GFR calc non Af Amer: 95 mL/min/{1.73_m2} (ref >59)
Globulin, Total: 2.3 g/dL (ref 1.5–4.5)
Glucose: 104 mg/dL — ABNORMAL HIGH (ref 65–99)
Potassium: 4.7 mmol/L (ref 3.5–5.2)
Sodium: 140 mmol/L (ref 134–144)
Total Protein: 6.9 g/dL (ref 6.0–8.5)

## 2020-06-20 LAB — LIPID PANEL
Chol/HDL Ratio: 2.1 ratio (ref 0.0–5.0)
Cholesterol, Total: 203 mg/dL — ABNORMAL HIGH (ref 100–199)
HDL: 95 mg/dL (ref >39)
LDL Chol Calc (NIH): 97 mg/dL (ref 0–99)
Triglycerides: 59 mg/dL (ref 0–149)
VLDL Cholesterol Cal: 11 mg/dL (ref 5–40)

## 2020-06-20 NOTE — Progress Notes (Signed)
Lab work looks great.  Continue with current medication regimen.  It was a pleasure meeting you yesterday.  I look forward to seeing you at our next visit.  Keep up the good work.

## 2020-07-18 ENCOUNTER — Other Ambulatory Visit: Payer: Self-pay

## 2020-07-18 ENCOUNTER — Ambulatory Visit (INDEPENDENT_AMBULATORY_CARE_PROVIDER_SITE_OTHER): Payer: Medicare Other | Admitting: Nurse Practitioner

## 2020-07-18 ENCOUNTER — Encounter: Payer: Self-pay | Admitting: Nurse Practitioner

## 2020-07-18 VITALS — BP 152/83 | HR 76 | Temp 98.0°F | Wt 201.0 lb

## 2020-07-18 DIAGNOSIS — I10 Essential (primary) hypertension: Secondary | ICD-10-CM

## 2020-07-18 MED ORDER — AMLODIPINE BESYLATE 5 MG PO TABS: 5.0000 mg | ORAL_TABLET | Freq: Every day | ORAL | 0 refills | Status: DC

## 2020-07-18 NOTE — Progress Notes (Signed)
BP (!) 152/83   Pulse 76   Temp 98 F (36.7 C)   Wt 201 lb (91.2 kg)   SpO2 98%   BMI 29.26 kg/m    Subjective:    Patient ID: Charles Bryant, male    DOB: [birth year], 65 y.o.   MRN: [id number]  HPI: Charles Bryant is a 65 y.o. male  Chief Complaint  Patient presents with  . Hypertension    Patient states that he had one episode of low blood pressure since his medication has been increased.    HYPERTENSION Quinapril was increased at last visit from 19m to 466m  Patient here to follow up on BP today.  Hypertension status: uncontrolled  Satisfied with current treatment? yes Duration of hypertension: years BP monitoring frequency:  daily BP range: 140-150/70s BP medication side effects:  no Medication compliance: excellent compliance Previous BP meds:Metoprolol, HCTZ and quinapril Aspirin: yes Recurrent headaches: no Visual changes: no Palpitations: no Dyspnea: no Chest pain: no Lower extremity edema: no Patient has had lower extremity edema in the past and was placed on the HCTZ.  No edema at visit today.  Dizzy/lightheaded: no   Relevant past medical, surgical, family and social history reviewed and updated as indicated. Interim medical history since our last visit reviewed. Allergies and medications reviewed and updated.  Review of Systems  Eyes: Negative for visual disturbance.  Respiratory: Negative for shortness of breath.   Cardiovascular: Negative for chest pain and leg swelling.  Neurological: Negative for light-headedness and headaches.    Per HPI unless specifically indicated above     Objective:    BP (!) 152/83   Pulse 76   Temp 98 F (36.7 C)   Wt 201 lb (91.2 kg)   SpO2 98%   BMI 29.26 kg/m   Wt Readings from Last 3 Encounters:  07/18/20 201 lb (91.2 kg)  06/19/20 208 lb 3.2 oz (94.4 kg)  05/23/20 207 lb (93.9 kg)    Physical Exam Vitals and nursing note reviewed.  Constitutional:      General: He is not in acute distress.    Appearance:  Normal appearance. He is not ill-appearing, toxic-appearing or diaphoretic.  HENT:     Head: Normocephalic.     Right Ear: External ear normal.     Left Ear: External ear normal.     Nose: Nose normal. No congestion or rhinorrhea.     Mouth/Throat:     Mouth: Mucous membranes are moist.  Eyes:     General:        Right eye: No discharge.        Left eye: No discharge.     Extraocular Movements: Extraocular movements intact.     Conjunctiva/sclera: Conjunctivae normal.     Pupils: Pupils are equal, round, and reactive to light.  Cardiovascular:     Rate and Rhythm: Normal rate and regular rhythm.     Heart sounds: No murmur heard.   Pulmonary:     Effort: Pulmonary effort is normal. No respiratory distress.     Breath sounds: Normal breath sounds. No wheezing, rhonchi or rales.  Abdominal:     General: Abdomen is flat. Bowel sounds are normal.  Musculoskeletal:     Cervical back: Normal range of motion and neck supple.  Skin:    General: Skin is warm and dry.     Capillary Refill: Capillary refill takes less than 2 seconds.  Neurological:     General: No focal deficit present.  Mental Status: He is alert and oriented to person, place, and time.  Psychiatric:        Mood and Affect: Mood normal.        Behavior: Behavior normal.        Thought Content: Thought content normal.        Judgment: Judgment normal.     Results for orders placed or performed in visit on 06/19/20  Comp Met (CMET)  Result Value Ref Range   Glucose 104 (H) 65 - 99 mg/dL   BUN 14 8 - 27 mg/dL   Creatinine, Ser 0.76 0.76 - 1.27 mg/dL   GFR calc non Af Amer 95 >59 mL/min/1.73   GFR calc Af Amer 110 >59 mL/min/1.73   BUN/Creatinine Ratio 18 10 - 24   Sodium 140 134 - 144 mmol/L   Potassium 4.7 3.5 - 5.2 mmol/L   Chloride 98 96 - 106 mmol/L   CO2 26 20 - 29 mmol/L   Calcium 9.8 8.6 - 10.2 mg/dL   Total Protein 6.9 6.0 - 8.5 g/dL   Albumin 4.6 3.8 - 4.8 g/dL   Globulin, Total 2.3 1.5 - 4.5  g/dL   Albumin/Globulin Ratio 2.0 1.2 - 2.2   Bilirubin Total 0.5 0.0 - 1.2 mg/dL   Alkaline Phosphatase 81 44 - 121 IU/L   AST 33 0 - 40 IU/L   ALT 39 0 - 44 IU/L  Lipid Profile  Result Value Ref Range   Cholesterol, Total 203 (H) 100 - 199 mg/dL   Triglycerides 59 0 - 149 mg/dL   HDL 95 >39 mg/dL   VLDL Cholesterol Cal 11 5 - 40 mg/dL   LDL Chol Calc (NIH) 97 0 - 99 mg/dL   Chol/HDL Ratio 2.1 0.0 - 5.0 ratio      Assessment & Plan:   Problem List Items Addressed This Visit      Cardiovascular and Mediastinum   Essential hypertension - Primary    Chronic. Uncontrolled.  Begin Amlodipine daily.  Side effects and benefits of medication discussed with patient at visit today.  D/C HCTZ.  If swelling returns will add it back.  Continue to check Bps at home.  Follow up in 1 month.      Relevant Medications   amLODipine (NORVASC) 5 MG tablet       Follow up plan: Return in about 1 month (around 08/18/2020) for BP Check.   A total of 30 minutes were spent on this encounter today.  When total time is documented, this includes both the face-to-face and non-face-to-face time personally spent before, during and after the visit on the date of the encounter.

## 2020-07-18 NOTE — Assessment & Plan Note (Signed)
Chronic. Uncontrolled.  Begin Amlodipine daily.  Side effects and benefits of medication discussed with patient at visit today.  D/C HCTZ.  If swelling returns will add it back.  Continue to check Bps at home.  Follow up in 1 month.

## 2020-08-14 ENCOUNTER — Other Ambulatory Visit: Payer: Self-pay | Admitting: Nurse Practitioner

## 2020-08-14 NOTE — Telephone Encounter (Signed)
Approved per protocol. Patient has follow-up for this medication on 08/18/20.

## 2020-08-17 NOTE — Progress Notes (Signed)
BP 138/78   Pulse 82   Temp (!) 97.3 F (36.3 C)   Wt 201 lb (91.2 kg)   SpO2 100%   BMI 29.26 kg/m    Subjective:    Patient ID: Charles Bryant, male    DOB: 1955/06/10, 65 y.o.   MRN: [id number]  HPI: Charles Bryant is a 65 y.o. male  Chief Complaint  Patient presents with  . Hypertension   HYPERTENSION Patient has been seen a couple of times for the last several months to have his blood pressure medication titrated. Quinapril was increased from 36m to 419m  Then at last visit Amlodipine was added to regimen.  Patient here to follow up on BP today. Patient does check blood pressures at home.   Hypertension status: uncontrolled  Satisfied with current treatment? yes Duration of hypertension: years BP monitoring frequency:  daily BP range: 140-170/70-80s BP medication side effects:  no Medication compliance: excellent compliance Previous BP meds: Metoprolol, Amlodipine, and Quinapril. Aspirin: yes Recurrent headaches: no Visual changes: no Palpitations: no Dyspnea: no Chest pain: no Lower extremity edema: no Patient has had lower extremity edema in the past and was placed on the HCTZ.  No edema at visit today.  Dizzy/lightheaded: no   Relevant past medical, surgical, family and social history reviewed and updated as indicated. Interim medical history since our last visit reviewed. Allergies and medications reviewed and updated.  Review of Systems  Eyes: Negative for visual disturbance.  Respiratory: Negative for shortness of breath.   Cardiovascular: Negative for chest pain and leg swelling.  Neurological: Negative for light-headedness and headaches.    Per HPI unless specifically indicated above     Objective:    BP 138/78   Pulse 82   Temp (!) 97.3 F (36.3 C)   Wt 201 lb (91.2 kg)   SpO2 100%   BMI 29.26 kg/m   Wt Readings from Last 3 Encounters:  08/18/20 201 lb (91.2 kg)  07/18/20 201 lb (91.2 kg)  06/19/20 208 lb 3.2 oz (94.4 kg)    Physical  Exam Vitals and nursing note reviewed.  Constitutional:      General: He is not in acute distress.    Appearance: Normal appearance. He is not ill-appearing, toxic-appearing or diaphoretic.  HENT:     Head: Normocephalic.     Right Ear: External ear normal.     Left Ear: External ear normal.     Nose: Nose normal. No congestion or rhinorrhea.     Mouth/Throat:     Mouth: Mucous membranes are moist.  Eyes:     General:        Right eye: No discharge.        Left eye: No discharge.     Extraocular Movements: Extraocular movements intact.     Conjunctiva/sclera: Conjunctivae normal.     Pupils: Pupils are equal, round, and reactive to light.  Cardiovascular:     Rate and Rhythm: Normal rate and regular rhythm.     Heart sounds: No murmur heard.   Pulmonary:     Effort: Pulmonary effort is normal. No respiratory distress.     Breath sounds: Normal breath sounds. No wheezing, rhonchi or rales.  Abdominal:     General: Abdomen is flat. Bowel sounds are normal.  Musculoskeletal:     Cervical back: Normal range of motion and neck supple.  Skin:    General: Skin is warm and dry.     Capillary Refill: Capillary refill takes less than  2 seconds.  Neurological:     General: No focal deficit present.     Mental Status: He is alert and oriented to person, place, and time.  Psychiatric:        Mood and Affect: Mood normal.        Behavior: Behavior normal.        Thought Content: Thought content normal.        Judgment: Judgment normal.     Results for orders placed or performed in visit on 06/19/20  Comp Met (CMET)  Result Value Ref Range   Glucose 104 (H) 65 - 99 mg/dL   BUN 14 8 - 27 mg/dL   Creatinine, Ser 0.76 0.76 - 1.27 mg/dL   GFR calc non Af Amer 95 >59 mL/min/1.73   GFR calc Af Amer 110 >59 mL/min/1.73   BUN/Creatinine Ratio 18 10 - 24   Sodium 140 134 - 144 mmol/L   Potassium 4.7 3.5 - 5.2 mmol/L   Chloride 98 96 - 106 mmol/L   CO2 26 20 - 29 mmol/L   Calcium 9.8  8.6 - 10.2 mg/dL   Total Protein 6.9 6.0 - 8.5 g/dL   Albumin 4.6 3.8 - 4.8 g/dL   Globulin, Total 2.3 1.5 - 4.5 g/dL   Albumin/Globulin Ratio 2.0 1.2 - 2.2   Bilirubin Total 0.5 0.0 - 1.2 mg/dL   Alkaline Phosphatase 81 44 - 121 IU/L   AST 33 0 - 40 IU/L   ALT 39 0 - 44 IU/L  Lipid Profile  Result Value Ref Range   Cholesterol, Total 203 (H) 100 - 199 mg/dL   Triglycerides 59 0 - 149 mg/dL   HDL 95 >39 mg/dL   VLDL Cholesterol Cal 11 5 - 40 mg/dL   LDL Chol Calc (NIH) 97 0 - 99 mg/dL   Chol/HDL Ratio 2.1 0.0 - 5.0 ratio      Assessment & Plan:   Problem List Items Addressed This Visit      Cardiovascular and Mediastinum   Essential hypertension - Primary    Chronic. Uncontrolled.  Increase Amlodipineto 20m  daily.  Side effects and benefits of medication discussed with patient at visit today.  Continue without HCTZ.  If swelling returns will add it back.  Continue to check Bps at home.  Follow up in 1 month.      Relevant Medications   amLODipine (NORVASC) 10 MG tablet       Follow up plan: Return in about 1 month (around 09/17/2020) for BP Check.   A total of 30 minutes were spent on this encounter today.  When total time is documented, this includes both the face-to-face and non-face-to-face time personally spent before, during and after the visit on the date of the encounter.

## 2020-08-18 ENCOUNTER — Encounter: Payer: Self-pay | Admitting: Nurse Practitioner

## 2020-08-18 ENCOUNTER — Other Ambulatory Visit: Payer: Self-pay

## 2020-08-18 ENCOUNTER — Ambulatory Visit (INDEPENDENT_AMBULATORY_CARE_PROVIDER_SITE_OTHER): Payer: Medicare Other | Admitting: Nurse Practitioner

## 2020-08-18 VITALS — BP 138/78 | HR 82 | Temp 97.3°F | Wt 201.0 lb

## 2020-08-18 DIAGNOSIS — I10 Essential (primary) hypertension: Secondary | ICD-10-CM | POA: Diagnosis not present

## 2020-08-18 MED ORDER — AMLODIPINE BESYLATE 10 MG PO TABS: 10.0000 mg | ORAL_TABLET | Freq: Every day | ORAL | 0 refills | Status: DC

## 2020-08-18 NOTE — Assessment & Plan Note (Signed)
Chronic. Uncontrolled.  Increase Amlodipineto 10mg   daily.  Side effects and benefits of medication discussed with patient at visit today.  Continue without HCTZ.  If swelling returns will add it back.  Continue to check Bps at home.  Follow up in 1 month.

## 2020-08-19 ENCOUNTER — Other Ambulatory Visit: Payer: Self-pay | Admitting: Nurse Practitioner

## 2020-09-17 ENCOUNTER — Encounter: Payer: Self-pay | Admitting: Nurse Practitioner

## 2020-09-17 ENCOUNTER — Other Ambulatory Visit: Payer: Self-pay

## 2020-09-17 ENCOUNTER — Ambulatory Visit (INDEPENDENT_AMBULATORY_CARE_PROVIDER_SITE_OTHER): Payer: Medicare Other | Admitting: Nurse Practitioner

## 2020-09-17 VITALS — BP 139/79 | HR 80 | Temp 98.2°F | Wt 205.0 lb

## 2020-09-17 DIAGNOSIS — I10 Essential (primary) hypertension: Secondary | ICD-10-CM | POA: Diagnosis not present

## 2020-09-17 MED ORDER — AMLODIPINE BESYLATE 10 MG PO TABS: 1.0000 | ORAL_TABLET | Freq: Every day | ORAL | 3 refills | Status: DC

## 2020-09-17 NOTE — Assessment & Plan Note (Signed)
Chronic. Controlled.  Continue with current medication regimen.  Refills sent today.  Follow up in 3 months for reevaluation.

## 2020-09-17 NOTE — Progress Notes (Signed)
BP 139/79   Pulse 80   Temp 98.2 F (36.8 C)   Wt 205 lb (93 kg) Comment: Patient reported  SpO2 97%   BMI 29.84 kg/m    Subjective:    Patient ID: Charles Bryant, male    DOB: [birth year], 65 y.o.   MRN: [id number]  HPI: Charles Bryant is a 65 y.o. male  Chief Complaint  Patient presents with  . Hypertension   HYPERTENSION Patient has been seen a couple of times for the last several months to have his blood pressure medication titrated. Quinapril was increased from 87m to 441m  Then at last visit Amlodipine was added to regimen.  Patient here to follow up on BP today. Patient does check blood pressures at home.   Hypertension status: Controlled Satisfied with current treatment? yes Duration of hypertension: years BP monitoring frequency:  daily BP range: 110-130/80 BP medication side effects:  no Medication compliance: excellent compliance Previous BP meds: Metoprolol, Amlodipine, and Quinapril. Aspirin: yes Recurrent headaches: no Visual changes: no Palpitations: no Dyspnea: no Chest pain: no Lower extremity edema: no Patient has had lower extremity edema in the past and was placed on the HCTZ.  No edema at visit today.  Dizzy/lightheaded: no   Relevant past medical, surgical, family and social history reviewed and updated as indicated. Interim medical history since our last visit reviewed. Allergies and medications reviewed and updated.  Review of Systems  Eyes: Negative for visual disturbance.  Respiratory: Negative for shortness of breath.   Cardiovascular: Negative for chest pain and leg swelling.  Neurological: Negative for light-headedness and headaches.    Per HPI unless specifically indicated above     Objective:    BP 139/79   Pulse 80   Temp 98.2 F (36.8 C)   Wt 205 lb (93 kg) Comment: Patient reported  SpO2 97%   BMI 29.84 kg/m   Wt Readings from Last 3 Encounters:  09/17/20 205 lb (93 kg)  08/18/20 201 lb (91.2 kg)  07/18/20 201 lb (91.2 kg)     Physical Exam Vitals and nursing note reviewed.  Constitutional:      General: He is not in acute distress.    Appearance: Normal appearance. He is not ill-appearing, toxic-appearing or diaphoretic.  HENT:     Head: Normocephalic.     Right Ear: External ear normal.     Left Ear: External ear normal.     Nose: Nose normal. No congestion or rhinorrhea.     Mouth/Throat:     Mouth: Mucous membranes are moist.  Eyes:     General:        Right eye: No discharge.        Left eye: No discharge.     Extraocular Movements: Extraocular movements intact.     Conjunctiva/sclera: Conjunctivae normal.     Pupils: Pupils are equal, round, and reactive to light.  Cardiovascular:     Rate and Rhythm: Normal rate and regular rhythm.     Heart sounds: No murmur heard.   Pulmonary:     Effort: Pulmonary effort is normal. No respiratory distress.     Breath sounds: Normal breath sounds. No wheezing, rhonchi or rales.  Abdominal:     General: Abdomen is flat. Bowel sounds are normal.  Musculoskeletal:     Cervical back: Normal range of motion and neck supple.  Skin:    General: Skin is warm and dry.     Capillary Refill: Capillary refill takes less than 2  seconds.  Neurological:     General: No focal deficit present.     Mental Status: He is alert and oriented to person, place, and time.  Psychiatric:        Mood and Affect: Mood normal.        Behavior: Behavior normal.        Thought Content: Thought content normal.        Judgment: Judgment normal.     Results for orders placed or performed in visit on 06/19/20  Comp Met (CMET)  Result Value Ref Range   Glucose 104 (H) 65 - 99 mg/dL   BUN 14 8 - 27 mg/dL   Creatinine, Ser 0.76 0.76 - 1.27 mg/dL   GFR calc non Af Amer 95 >59 mL/min/1.73   GFR calc Af Amer 110 >59 mL/min/1.73   BUN/Creatinine Ratio 18 10 - 24   Sodium 140 134 - 144 mmol/L   Potassium 4.7 3.5 - 5.2 mmol/L   Chloride 98 96 - 106 mmol/L   CO2 26 20 - 29 mmol/L    Calcium 9.8 8.6 - 10.2 mg/dL   Total Protein 6.9 6.0 - 8.5 g/dL   Albumin 4.6 3.8 - 4.8 g/dL   Globulin, Total 2.3 1.5 - 4.5 g/dL   Albumin/Globulin Ratio 2.0 1.2 - 2.2   Bilirubin Total 0.5 0.0 - 1.2 mg/dL   Alkaline Phosphatase 81 44 - 121 IU/L   AST 33 0 - 40 IU/L   ALT 39 0 - 44 IU/L  Lipid Profile  Result Value Ref Range   Cholesterol, Total 203 (H) 100 - 199 mg/dL   Triglycerides 59 0 - 149 mg/dL   HDL 95 >39 mg/dL   VLDL Cholesterol Cal 11 5 - 40 mg/dL   LDL Chol Calc (NIH) 97 0 - 99 mg/dL   Chol/HDL Ratio 2.1 0.0 - 5.0 ratio      Assessment & Plan:   Problem List Items Addressed This Visit      Cardiovascular and Mediastinum   Essential hypertension - Primary    Chronic. Controlled.  Continue with current medication regimen.  Refills sent today.  Follow up in 3 months for reevaluation.       Relevant Medications   amLODipine (NORVASC) 10 MG tablet       Follow up plan: Return in about 3 months (around 12/18/2020) for HTN, HLD, DM2 FU.   A total of 20 minutes were spent on this encounter today.  When total time is documented, this includes both the face-to-face and non-face-to-face time personally spent before, during and after the visit on the date of the encounter.

## 2020-09-23 LAB — COLOGUARD: Cologuard: NEGATIVE

## 2020-09-24 ENCOUNTER — Other Ambulatory Visit: Payer: Self-pay | Admitting: Nurse Practitioner

## 2020-10-01 LAB — COLOGUARD: COLOGUARD: NEGATIVE

## 2020-10-01 LAB — EXTERNAL GENERIC LAB PROCEDURE: COLOGUARD: NEGATIVE

## 2020-12-18 NOTE — Progress Notes (Signed)
BP (!) 146/73   Pulse 77   Temp 97.9 F (36.6 C)   Ht 5' 8.66" (1.744 m)   Wt 209 lb 6 oz (95 kg)   SpO2 97%   BMI 31.23 kg/m    Subjective:    Patient ID: Charles Bryant, male    DOB: [birth year], 65 y.o.   MRN: [id number]  HPI: Charles Bryant is a 65 y.o. male presenting on 12/19/2020 for comprehensive medical examination. Current medical complaints include:none  He currently lives with: Interim Problems from his last visit: yes  HYPERTENSION / HYPERLIPIDEMIA Satisfied with current treatment? no Duration of hypertension: years BP monitoring frequency: daily BP range: 120-130/70-80 BP medication side effects: no Past BP meds:  amlodipine, quinapril, HCTZ, Metoprolol Duration of hyperlipidemia: years Cholesterol medication side effects: no Cholesterol supplements: none Past cholesterol medications: atorvastain (lipitor) Medication compliance: excellent compliance Aspirin: yes Recent stressors: no Recurrent headaches: no Visual changes: no Palpitations: no Dyspnea: no Chest pain: no Lower extremity edema: no Dizzy/lightheaded: no  COPD COPD status: controlled Satisfied with current treatment?: yes Oxygen use: no Dyspnea frequency:  Cough frequency:  Rescue inhaler frequency:   Limitation of activity: no Productive cough:  Last Spirometry:  Pneumovax: Up to Date Influenza: Up to Date  Depression Screen done today and results listed below:  Depression screen [facility] 2/9 05/23/2020 12/18/2019 06/18/2019  Decreased Interest 0 0 0  Down, Depressed, Hopeless 0 0 0  PHQ - 2 Score 0 0 0  Altered sleeping - 2 1  Tired, decreased energy - 0 0  Change in appetite - 0 0  Feeling bad or failure about yourself  - 0 0  Trouble concentrating - 0 0  Moving slowly or fidgety/restless - 0 0  Suicidal thoughts - 0 0  PHQ-9 Score - 2 1    The patient does not have a history of falls. I did complete a risk assessment for falls. A plan of care for falls was documented.   Past Medical  History:  Past Medical History:  Diagnosis Date   Allergy Childhood   Asthma    COPD (chronic obstructive pulmonary disease) (HCC)    GERD (gastroesophageal reflux disease)    Hyperlipidemia    Hypertension     Surgical History:  Past Surgical History:  Procedure Laterality Date   carotid surgery     EYE SURGERY  6 years ago   Blepharoplasty    Medications:  Current Outpatient Medications on File Prior to Visit  Medication Sig   hydrochlorothiazide (MICROZIDE) 12.5 MG capsule Take 12.5 mg by mouth daily.   albuterol (VENTOLIN HFA) 108 (90 Base) MCG/ACT inhaler INHALE 1 TO 2 INHALATIONS  BY MOUTH INTO THE LUNGS  TWICE DAILY   amLODipine (NORVASC) 10 MG tablet Take 1 tablet (10 mg total) by mouth daily.   aspirin EC 81 MG tablet Take 81 mg by mouth daily. Swallow whole.   Fluticasone-Salmeterol (WIXELA INHUB) 500-50 MCG/DOSE AEPB USE 1 INHALATION BY MOUTH  TWICE DAILY   metoprolol succinate (TOPROL-XL) 50 MG 24 hr tablet TAKE 1 TABLET BY MOUTH  DAILY WITH OR IMMEDIATELY  FOLLOWING A MEAL   quinapril (ACCUPRIL) 40 MG tablet Take 1 tablet (40 mg total) by mouth at bedtime.   tadalafil (CIALIS) 20 MG tablet Take 1 tablet (20 mg total) by mouth daily as needed for erectile dysfunction.   No current facility-administered medications on file prior to visit.    Allergies:  No Known Allergies  Social History:  Social History  Socioeconomic History   Marital status: Married    Spouse name: Not on file   Number of children: Not on file   Years of education: Not on file   Highest education level: Not on file  Occupational History   Not on file  Tobacco Use   Smoking status: Former   Smokeless tobacco: Never  Vaping Use   Vaping Use: Never used  Substance and Sexual Activity   Alcohol use: Not Currently   Drug use: Never   Sexual activity: Yes  Other Topics Concern   Not on file  Social History Narrative   Not on file   Social Determinants of Health   Financial  Resource Strain: Low Risk    Difficulty of Paying Living Expenses: Not hard at all  Food Insecurity: No Food Insecurity   Worried About Programme researcher, broadcasting/film/video in the Last Year: Never true   Ran Out of Food in the Last Year: Never true  Transportation Needs: No Transportation Needs   Lack of Transportation (Medical): No   Lack of Transportation (Non-Medical): No  Physical Activity: Inactive   Days of Exercise per Week: 0 days   Minutes of Exercise per Session: 0 min  Stress: No Stress Concern Present   Feeling of Stress : Not at all  Social Connections: Not on file  Intimate Partner Violence: Not on file   Social History   Tobacco Use  Smoking Status Former  Smokeless Tobacco Never   Social History   Substance and Sexual Activity  Alcohol Use Not Currently    Family History:  Family History  Problem Relation Age of Onset   COPD Mother    Cancer Father    Heart disease Maternal Grandmother    Stroke Maternal Grandfather    Hyperkalemia Maternal Grandfather     Past medical history, surgical history, medications, allergies, family history and social history reviewed with patient today and changes made to appropriate areas of the chart.   Review of Systems  Eyes:  Negative for blurred vision and double vision.  Respiratory:  Negative for shortness of breath.   Cardiovascular:  Negative for chest pain, palpitations and leg swelling.  Neurological:  Negative for dizziness and headaches.  All other ROS negative except what is listed above and in the HPI.      Objective:    BP (!) 146/73   Pulse 77   Temp 97.9 F (36.6 C)   Ht 5' 8.66" (1.744 m)   Wt 209 lb 6 oz (95 kg)   SpO2 97%   BMI 31.23 kg/m   Wt Readings from Last 3 Encounters:  12/19/20 209 lb 6 oz (95 kg)  09/17/20 205 lb (93 kg)  08/18/20 201 lb (91.2 kg)    Physical Exam Vitals and nursing note reviewed.  Constitutional:      General: He is not in acute distress.    Appearance: Normal appearance. He  is not ill-appearing, toxic-appearing or diaphoretic.  HENT:     Head: Normocephalic.     Right Ear: Tympanic membrane, ear canal and external ear normal.     Left Ear: Tympanic membrane, ear canal and external ear normal.     Nose: Nose normal. No congestion or rhinorrhea.     Mouth/Throat:     Mouth: Mucous membranes are moist.  Eyes:     General:        Right eye: No discharge.        Left eye: No discharge.  Extraocular Movements: Extraocular movements intact.     Conjunctiva/sclera: Conjunctivae normal.     Pupils: Pupils are equal, round, and reactive to light.  Cardiovascular:     Rate and Rhythm: Normal rate and regular rhythm.     Heart sounds: No murmur heard. Pulmonary:     Effort: Pulmonary effort is normal. No respiratory distress.     Breath sounds: Normal breath sounds. No wheezing, rhonchi or rales.  Abdominal:     General: Abdomen is flat. Bowel sounds are normal. There is no distension.     Palpations: Abdomen is soft.     Tenderness: There is no abdominal tenderness. There is no guarding.  Musculoskeletal:     Cervical back: Normal range of motion and neck supple.  Skin:    General: Skin is warm and dry.     Capillary Refill: Capillary refill takes less than 2 seconds.  Neurological:     General: No focal deficit present.     Mental Status: He is alert and oriented to person, place, and time.     Cranial Nerves: No cranial nerve deficit.     Motor: No weakness.     Deep Tendon Reflexes: Reflexes normal.  Psychiatric:        Mood and Affect: Mood normal.        Behavior: Behavior normal.        Thought Content: Thought content normal.        Judgment: Judgment normal.    Results for orders placed or performed in visit on 10/02/20  Cologuard  Result Value Ref Range   Cologuard Negative Negative      Assessment & Plan:   Problem List Items Addressed This Visit       Cardiovascular and Mediastinum   Essential hypertension    Chronic.   Controlled.  Elevated at visit today. But consistently within range at home.  Continue with current medication regimen HCTZ, Amlodipine, and Metoprolol.  Labs ordered today.  Return to clinic in 6 months for reevaluation.  Call sooner if concerns arise.        Relevant Medications   hydrochlorothiazide (MICROZIDE) 12.5 MG capsule   atorvastatin (LIPITOR) 10 MG tablet   Other Relevant Orders   Comprehensive metabolic panel     Respiratory   COPD (chronic obstructive pulmonary disease) (HCC)    Chronic.  Controlled.  Continue with current medication regimen.  Labs ordered today.  Return to clinic in 6 months for reevaluation.  Call sooner if concerns arise.        Relevant Orders   CBC with Differential/Platelet     Other   Hyperlipidemia    Chronic.  Controlled.  Continue with current medication regimen.  Labs ordered today.  Refills sent today.  Return to clinic in 6 months for reevaluation.  Call sooner if concerns arise.        Relevant Medications   hydrochlorothiazide (MICROZIDE) 12.5 MG capsule   atorvastatin (LIPITOR) 10 MG tablet   Other Relevant Orders   Lipid panel   Other Visit Diagnoses     Annual physical exam    -  Primary   Health maintenance reviewed today. Labs ordered today.    Relevant Orders   TSH   PSA   Lipid panel   CBC with Differential/Platelet   Comprehensive metabolic panel   Urinalysis, Routine w reflex microscopic        Discussed aspirin prophylaxis for myocardial infarction prevention and decision was made to continue ASA  LABORATORY TESTING:  Health maintenance labs ordered today as discussed above.   The natural history of prostate cancer and ongoing controversy regarding screening and potential treatment outcomes of prostate cancer has been discussed with the patient. The meaning of a false positive PSA and a false negative PSA has been discussed. He indicates understanding of the limitations of this screening test and wishes to  proceed with screening PSA testing.   IMMUNIZATIONS:   - Tdap: Tetanus vaccination status reviewed: not up to date. - Influenza: Postponed to flu season - Pneumovax: Up to date - Prevnar: Up to date - HPV: Not applicable - Zostavax vaccine:  not up to date  SCREENING: - Colonoscopy: Up to date  Discussed with patient purpose of the colonoscopy is to detect colon cancer at curable precancerous or early stages   - AAA Screening: Not applicable  -Hearing Test: Not applicable  -Spirometry: Not applicable   PATIENT COUNSELING:    Sexuality: Discussed sexually transmitted diseases, partner selection, use of condoms, avoidance of unintended pregnancy  and contraceptive alternatives.   Advised to avoid cigarette smoking.  I discussed with the patient that most people either abstain from alcohol or drink within safe limits (<=14/week and <=4 drinks/occasion for males, <=7/weeks and <= 3 drinks/occasion for females) and that the risk for alcohol disorders and other health effects rises proportionally with the number of drinks per week and how often a drinker exceeds daily limits.  Discussed cessation/primary prevention of drug use and availability of treatment for abuse.   Diet: Encouraged to adjust caloric intake to maintain  or achieve ideal body weight, to reduce intake of dietary saturated fat and total fat, to limit sodium intake by avoiding high sodium foods and not adding table salt, and to maintain adequate dietary potassium and calcium preferably from fresh fruits, vegetables, and low-fat dairy products.    stressed the importance of regular exercise  Injury prevention: Discussed safety belts, safety helmets, smoke detector, smoking near bedding or upholstery.   Dental health: Discussed importance of regular tooth brushing, flossing, and dental visits.   Follow up plan: NEXT PREVENTATIVE PHYSICAL DUE IN 1 YEAR. Return in about 6 months (around 06/21/2021) for HTN, HLD, DM2 FU.

## 2020-12-19 ENCOUNTER — Ambulatory Visit (INDEPENDENT_AMBULATORY_CARE_PROVIDER_SITE_OTHER): Payer: Medicare Other | Admitting: Nurse Practitioner

## 2020-12-19 ENCOUNTER — Encounter: Payer: Self-pay | Admitting: Nurse Practitioner

## 2020-12-19 ENCOUNTER — Other Ambulatory Visit: Payer: Self-pay

## 2020-12-19 VITALS — BP 146/73 | HR 77 | Temp 97.9°F | Ht 68.66 in | Wt 209.4 lb

## 2020-12-19 DIAGNOSIS — Z0000 Encounter for general adult medical examination without abnormal findings: Secondary | ICD-10-CM | POA: Diagnosis not present

## 2020-12-19 DIAGNOSIS — E782 Mixed hyperlipidemia: Secondary | ICD-10-CM | POA: Diagnosis not present

## 2020-12-19 DIAGNOSIS — J441 Chronic obstructive pulmonary disease with (acute) exacerbation: Secondary | ICD-10-CM

## 2020-12-19 DIAGNOSIS — I10 Essential (primary) hypertension: Secondary | ICD-10-CM | POA: Diagnosis not present

## 2020-12-19 LAB — URINALYSIS, ROUTINE W REFLEX MICROSCOPIC
Bilirubin, UA: NEGATIVE
Glucose, UA: NEGATIVE
Ketones, UA: NEGATIVE
Leukocytes,UA: NEGATIVE
Nitrite, UA: NEGATIVE
Protein,UA: NEGATIVE
RBC, UA: NEGATIVE
Specific Gravity, UA: 1.02 (ref 1.005–1.030)
Urobilinogen, Ur: 0.2 mg/dL (ref 0.2–1.0)
pH, UA: 5 (ref 5.0–7.5)

## 2020-12-19 MED ORDER — ATORVASTATIN CALCIUM 10 MG PO TABS: 10.0000 mg | ORAL_TABLET | Freq: Every day | ORAL | 3 refills | Status: DC

## 2020-12-19 MED ORDER — OMEPRAZOLE 40 MG PO CPDR: 40.0000 mg | DELAYED_RELEASE_CAPSULE | Freq: Every day | ORAL | 3 refills | Status: DC

## 2020-12-19 NOTE — Assessment & Plan Note (Signed)
Chronic.  Controlled.  Elevated at visit today. But consistently within range at home.  Continue with current medication regimen HCTZ, Amlodipine, and Metoprolol.  Labs ordered today.  Return to clinic in 6 months for reevaluation.  Call sooner if concerns arise.

## 2020-12-19 NOTE — Assessment & Plan Note (Signed)
Chronic.  Controlled.  Continue with current medication regimen.  Labs ordered today.  Return to clinic in 6 months for reevaluation.  Call sooner if concerns arise.  ? ?

## 2020-12-19 NOTE — Assessment & Plan Note (Signed)
Chronic.  Controlled.  Continue with current medication regimen.  Labs ordered today.  Refills sent today.  Return to clinic in 6 months for reevaluation.  Call sooner if concerns arise.   

## 2020-12-20 LAB — COMPREHENSIVE METABOLIC PANEL
ALT: 39 IU/L (ref 0–44)
AST: 37 IU/L (ref 0–40)
Albumin/Globulin Ratio: 2 (ref 1.2–2.2)
Albumin: 4.7 g/dL (ref 3.8–4.8)
Alkaline Phosphatase: 110 IU/L (ref 44–121)
BUN/Creatinine Ratio: 12 (ref 10–24)
BUN: 8 mg/dL (ref 8–27)
Bilirubin Total: 0.3 mg/dL (ref 0.0–1.2)
CO2: 25 mmol/L (ref 20–29)
Calcium: 9.6 mg/dL (ref 8.6–10.2)
Chloride: 96 mmol/L (ref 96–106)
Creatinine, Ser: 0.69 mg/dL — ABNORMAL LOW (ref 0.76–1.27)
Globulin, Total: 2.3 g/dL (ref 1.5–4.5)
Glucose: 96 mg/dL (ref 65–99)
Potassium: 4.3 mmol/L (ref 3.5–5.2)
Sodium: 137 mmol/L (ref 134–144)
Total Protein: 7 g/dL (ref 6.0–8.5)
eGFR: 101 mL/min/{1.73_m2} (ref >59)

## 2020-12-20 LAB — LIPID PANEL
Chol/HDL Ratio: 1.9 ratio (ref 0.0–5.0)
Cholesterol, Total: 194 mg/dL (ref 100–199)
HDL: 104 mg/dL (ref >39)
LDL Chol Calc (NIH): 79 mg/dL (ref 0–99)
Triglycerides: 56 mg/dL (ref 0–149)
VLDL Cholesterol Cal: 11 mg/dL (ref 5–40)

## 2020-12-20 LAB — CBC WITH DIFFERENTIAL/PLATELET
Basophils Absolute: 0.1 10*3/uL (ref 0.0–0.2)
Basos: 1 %
EOS (ABSOLUTE): 0.3 10*3/uL (ref 0.0–0.4)
Eos: 5 %
Hematocrit: 43.8 % (ref 37.5–51.0)
Hemoglobin: 15.2 g/dL (ref 13.0–17.7)
Immature Grans (Abs): 0 10*3/uL (ref 0.0–0.1)
Immature Granulocytes: 1 %
Lymphocytes Absolute: 1.9 10*3/uL (ref 0.7–3.1)
Lymphs: 37 %
MCH: 32.1 pg (ref 26.6–33.0)
MCHC: 34.7 g/dL (ref 31.5–35.7)
MCV: 93 fL (ref 79–97)
Monocytes Absolute: 0.5 10*3/uL (ref 0.1–0.9)
Monocytes: 10 %
Neutrophils Absolute: 2.4 10*3/uL (ref 1.4–7.0)
Neutrophils: 46 %
Platelets: 196 10*3/uL (ref 150–450)
RBC: 4.73 x10E6/uL (ref 4.14–5.80)
RDW: 12.8 % (ref 11.6–15.4)
WBC: 5.1 10*3/uL (ref 3.4–10.8)

## 2020-12-20 LAB — PSA: Prostate Specific Ag, Serum: 0.9 ng/mL (ref 0.0–4.0)

## 2020-12-20 LAB — TSH: TSH: 2.51 u[IU]/mL (ref 0.450–4.500)

## 2021-04-21 ENCOUNTER — Other Ambulatory Visit: Payer: Self-pay | Admitting: Nurse Practitioner

## 2021-04-21 NOTE — Telephone Encounter (Signed)
Requested Prescriptions  Pending Prescriptions Disp Refills   metoprolol succinate (TOPROL-XL) 50 MG 24 hr tablet [Pharmacy Med Name: Metoprolol Succinate ER 50 MG Oral Tablet Extended Release 24 Hour] 90 tablet 3    Sig: TAKE 1 TABLET BY MOUTH  DAILY WITH OR IMMEDIATELY  FOLLOWING A MEAL     Cardiovascular:  Beta Blockers Failed - 04/21/2021  3:57 PM      Failed - Last BP in normal range    BP Readings from Last 1 Encounters:  12/19/20 (!) 146/73         Passed - Last Heart Rate in normal range    Pulse Readings from Last 1 Encounters:  12/19/20 77         Passed - Valid encounter within last 6 months    Recent Outpatient Visits          4 months ago Annual physical exam   [facility] Larae Grooms, NP   7 months ago Essential hypertension   [facility] Larae Grooms, NP   8 months ago Essential hypertension   [facility] Larae Grooms, NP   9 months ago Essential hypertension   [facility] Larae Grooms, NP   10 months ago Chronic obstructive pulmonary disease with acute exacerbation (HCC)   [facility] Larae Grooms, NP      Future Appointments            In 1 month  [facility]   In 2 months Larae Grooms, NP [facility], [facility]

## 2021-04-22 ENCOUNTER — Other Ambulatory Visit: Payer: Self-pay | Admitting: Nurse Practitioner

## 2021-04-23 NOTE — Telephone Encounter (Signed)
Requested Prescriptions  Pending Prescriptions Disp Refills   quinapril (ACCUPRIL) 40 MG tablet [Pharmacy Med Name: Quinapril HCl 40 MG Oral Tablet] 90 tablet 3    Sig: TAKE 1 TABLET BY MOUTH AT  BEDTIME     Cardiovascular:  ACE Inhibitors Failed - 04/22/2021 10:03 PM      Failed - Cr in normal range and within 180 days    Creatinine, Ser  Date Value Ref Range Status  12/19/2020 0.69 (L) 0.76 - 1.27 mg/dL Final         Failed - Last BP in normal range    BP Readings from Last 1 Encounters:  12/19/20 (!) 146/73         Passed - K in normal range and within 180 days    Potassium  Date Value Ref Range Status  12/19/2020 4.3 3.5 - 5.2 mmol/L Final         Passed - Patient is not pregnant      Passed - Valid encounter within last 6 months    Recent Outpatient Visits          4 months ago Annual physical exam   [facility] Larae Grooms, NP   7 months ago Essential hypertension   [facility] Larae Grooms, NP   8 months ago Essential hypertension   [facility] Larae Grooms, NP   9 months ago Essential hypertension   [facility] Larae Grooms, NP   10 months ago Chronic obstructive pulmonary disease with acute exacerbation (HCC)   [facility] Larae Grooms, NP      Future Appointments            In 1 month  [facility]   In 2 months Larae Grooms, NP [facility], [facility]

## 2021-05-28 ENCOUNTER — Ambulatory Visit (INDEPENDENT_AMBULATORY_CARE_PROVIDER_SITE_OTHER): Payer: Medicare Other | Admitting: *Deleted

## 2021-05-28 ENCOUNTER — Encounter: Payer: Self-pay | Admitting: Nurse Practitioner

## 2021-05-28 DIAGNOSIS — Z0000 Encounter for general adult medical examination without abnormal findings: Secondary | ICD-10-CM | POA: Diagnosis not present

## 2021-05-28 NOTE — Progress Notes (Signed)
Subjective:   Charles Bryant is a 66 y.o. male who presents for Medicare Annual/Subsequent preventive examination.  I connected with  Charles Bryant on 05/28/21 by a telephone enabled telemedicine application and verified that I am speaking with the correct person using two identifiers.   I discussed the limitations of evaluation and management by telemedicine. The patient expressed understanding and agreed to proceed.  Patient location: home  Provider location: tele-health not in office    Review of Systems     Cardiac Risk Factors include: advanced age (>68men, >55 women);hypertension;male gender     Objective:    Today's Vitals   05/28/21 0950  PainSc: 2    There is no height or weight on file to calculate BMI.  Advanced Directives 05/28/2021 05/23/2020  Does Patient Have a Medical Advance Directive? Yes Yes  Type of Estate agent of [employer] Power of [place];Living will  Copy of Healthcare Power of Attorney in Chart? No - copy requested No - copy requested    Current Medications (verified) Outpatient Encounter Medications as of 05/28/2021  Medication Sig   albuterol (VENTOLIN HFA) 108 (90 Base) MCG/ACT inhaler INHALE 1 TO 2 INHALATIONS  BY MOUTH INTO THE LUNGS  TWICE DAILY   amLODipine (NORVASC) 10 MG tablet Take 1 tablet (10 mg total) by mouth daily.   aspirin EC 81 MG tablet Take 81 mg by mouth daily. Swallow whole.   atorvastatin (LIPITOR) 10 MG tablet Take 1 tablet (10 mg total) by mouth daily.   Fluticasone-Salmeterol (WIXELA INHUB) 500-50 MCG/DOSE AEPB USE 1 INHALATION BY MOUTH  TWICE DAILY   hydrochlorothiazide (MICROZIDE) 12.5 MG capsule Take 12.5 mg by mouth daily.   metoprolol succinate (TOPROL-XL) 50 MG 24 hr tablet TAKE 1 TABLET BY MOUTH  DAILY WITH OR IMMEDIATELY  FOLLOWING A MEAL   omeprazole (PRILOSEC) 40 MG capsule Take 1 capsule (40 mg total) by mouth daily.   tadalafil (CIALIS) 20 MG tablet Take 1 tablet (20 mg total) by mouth  daily as needed for erectile dysfunction.   quinapril (ACCUPRIL) 40 MG tablet TAKE 1 TABLET BY MOUTH AT  BEDTIME (Patient not taking: Reported on 05/28/2021)   No facility-administered encounter medications on file as of 05/28/2021.    Allergies (verified) Patient has no known allergies.   History: Past Medical History:  Diagnosis Date   Allergy Childhood   Asthma    COPD (chronic obstructive pulmonary disease) (HCC)    GERD (gastroesophageal reflux disease)    Hyperlipidemia    Hypertension    Past Surgical History:  Procedure Laterality Date   carotid surgery     EYE SURGERY  6 years ago   Blepharoplasty   Family History  Problem Relation Age of Onset   COPD Mother    Cancer Father    Heart disease Maternal Grandmother    Stroke Maternal Grandfather    Hyperkalemia Maternal Grandfather    Social History   Socioeconomic History   Marital status: Married    Spouse name: Not on file   Number of children: Not on file   Years of education: Not on file   Highest education level: Not on file  Occupational History   Not on file  Tobacco Use   Smoking status: Former   Smokeless tobacco: Never  Vaping Use   Vaping Use: Never used  Substance and Sexual Activity   Alcohol use: Not Currently   Drug use: Never   Sexual activity: Yes  Other Topics Concern  Not on file  Social History Narrative   Not on file   Social Determinants of Health   Financial Resource Strain: Low Risk    Difficulty of Paying Living Expenses: Not hard at all  Food Insecurity: No Food Insecurity   Worried About Programme researcher, broadcasting/film/video in the Last Year: Never true   Barista in the Last Year: Never true  Transportation Needs: No Transportation Needs   Lack of Transportation (Medical): No   Lack of Transportation (Non-Medical): No  Physical Activity: Inactive   Days of Exercise per Week: 0 days   Minutes of Exercise per Session: 0 min  Stress: No Stress Concern Present   Feeling of  Stress : Not at all  Social Connections: Moderately Isolated   Frequency of Communication with Friends and Family: More than three times a week   Frequency of Social Gatherings with Friends and Family: Twice a week   Attends Religious Services: Never   Database administrator or Organizations: No   Attends Engineer, structural: Never   Marital Status: Married    Tobacco Counseling Counseling given: Not Answered   Clinical Intake:  Pre-visit preparation completed: Yes  Pain : No/denies pain Pain Score: 2  Pain Location: Shoulder Pain Orientation: Right Pain Descriptors / Indicators: Aching, Burning Pain Onset: More than a month ago Pain Relieving Factors: naproxan  Pain Relieving Factors: naproxan  Nutritional Risks: None Diabetes: No  How often do you need to have someone help you when you read instructions, pamphlets, or other written materials from your doctor or pharmacy?: 1 - Never  Diabetic?  no  Interpreter Needed?: No  Information entered by :: Remi Haggard LPN   Activities of Daily Living In your present state of health, do you have any difficulty performing the following activities: 05/28/2021  Hearing? N  Vision? N  Difficulty concentrating or making decisions? N  Walking or climbing stairs? N  Dressing or bathing? N  Doing errands, shopping? N  Preparing Food and eating ? N  Using the Toilet? N  In the past six months, have you accidently leaked urine? N  Do you have problems with loss of bowel control? N  Managing your Medications? N  Managing your Finances? N  Housekeeping or managing your Housekeeping? N  Some recent data might be hidden    Patient Care Team: Larae Grooms, NP as PCP - General  Indicate any recent Medical Services you may have received from other than [facility] providers in the past year (date may be approximate).     Assessment:   This is a routine wellness examination for [name].  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  0 0 0 0  Number falls in past yr: 0 - 0 0  Injury with Fall? 0 - 0 0  Risk for fall due to : - Medication side effect - -  Follow up Falls evaluation completed;Falls prevention discussed Falls evaluation completed;Education provided;Falls prevention discussed - -    FALL RISK PREVENTION PERTAINING TO THE HOME:  Any stairs in or around the home? Yes  If so, are there any without handrails? No  Home free of loose throw rugs in walkways, pet beds, electrical cords, etc? Yes  Adequate lighting in your home to reduce risk of falls? Yes   ASSISTIVE DEVICES UTILIZED TO PREVENT FALLS:  Life alert? No  Use of a cane, walker or w/c? No  Grab bars in the bathroom? No  Shower chair or bench in shower? Yes  Elevated toilet seat or a handicapped toilet? No   TIMED UP AND GO:  Was the test performed? No .    Cognitive Function:   Normal cognitive status assessed by direct observation by this Nurse Health Advisor. No abnormalities found.     6CIT Screen 05/23/2020  What Year? 0 points  What month? 0 points  What time? 0 points  Count back from 20 0 points  Months in reverse 0 points  Repeat phrase 2 points  Total Score 2    Immunizations Immunization History  Administered Date(s) Administered   Influenza-Unspecified  02/22/2019, 02/04/2020, 01/22/2021   PFIZER(Purple Top)SARS-COV-2 Vaccination 06/15/2019, 07/06/2019, 02/04/2020, 08/13/2020, 01/22/2021   Pneumococcal Conjugate-13 12/18/2019   Zoster Recombinat (Shingrix) 07/08/2016, 02/23/2017    TDAP status: Due, Education has been provided regarding the importance of this vaccine. Advised may receive this vaccine at local pharmacy or Health Dept. Aware to provide a copy of the vaccination record if obtained from local pharmacy or Health Dept. Verbalized acceptance and understanding.  Flu Vaccine status: Up to date  Pneumococcal vaccine status: Due, Education has been provided regarding the importance of this vaccine. Advised may receive this vaccine at local pharmacy or Health Dept. Aware to provide a copy of the vaccination record if obtained from local pharmacy or Health Dept. Verbalized acceptance and understanding.  Covid-19 vaccine status: Completed vaccines  Qualifies for Shingles Vaccine? No   Zostavax completed No   Shingrix Completed?: Yes  Screening Tests Health Maintenance  Topic Date Due   Pneumonia Vaccine 7465+ Years old (2 - PPSV23 if available, else PCV20) 12/17/2020   TETANUS/TDAP  06/19/2021 (Originally 09/04/1974)   Fecal DNA (Cologuard)  09/24/2023   INFLUENZA VACCINE  Completed   COVID-19 Vaccine  Completed   Hepatitis C Screening  Completed   Zoster Vaccines- Shingrix  Completed   HPV VACCINES  Aged Out    Health Maintenance  Health Maintenance Due  Topic Date Due   Pneumonia Vaccine 4265+ Years old (2 - PPSV23 if available, else PCV20) 12/17/2020    Colorectal cancer screening: Type of screening: Cologuard. Completed 2022. Repeat every 3 years  Lung Cancer Screening: (Low Dose CT Chest recommended if Age 50-80 years, 30 pack-year currently smoking OR have quit w/in 15years.) does not qualify.   Lung Cancer Screening Referral:   Additional Screening:  Hepatitis C Screening: does not qualify; Completed 2021  Vision  Screening: Recommended annual ophthalmology exams for early detection of glaucoma and other disorders of the eye. Is the patient up to date with their annual eye exam?  Yes  Who is the provider or what is the name of the office in which the  patient attends annual eye exams? [facility] If pt is not established with a provider, would they like to be referred to a provider to establish care? No .   Dental Screening: Recommended annual dental exams for proper oral hygiene  Community Resource Referral / Chronic Care Management: CRR required this visit?  No   CCM required this visit?  No      Plan:     I have personally reviewed and noted the following in the patients chart:   Medical and social history Use of alcohol, tobacco or illicit drugs  Current medications and supplements including opioid prescriptions. Patient is not currently taking opioid prescriptions. Functional ability and status Nutritional status Physical activity Advanced directives List of other physicians Hospitalizations, surgeries, and ER visits in previous 12 months Vitals Screenings to include cognitive, depression, and falls Referrals and appointments  In addition, I have reviewed and discussed with patient certain preventive protocols, quality metrics, and best practice recommendations. A written personalized care plan for preventive services as well as general preventive health recommendations were provided to patient.     Remi HaggardJulie Dandre Sisler, LPN   4/09/81191/19/2023   Nurse Notes:

## 2021-05-29 ENCOUNTER — Ambulatory Visit: Payer: Medicare Other

## 2021-05-29 NOTE — Progress Notes (Signed)
BP 123/70    Pulse 90    Temp 98.2 F (36.8 C)    Ht 5' 9"  (1.753 m)    Wt 215 lb (97.5 kg)    SpO2 97%    BMI 31.75 kg/m    Subjective:    Patient ID: Charles Bryant, male    DOB: Mar 09, 1956, 66 y.o.   MRN: [id number]  HPI: Charles Bryant is a 66 y.o. male  Chief Complaint  Patient presents with   Shoulder Pain    Patient states his right shoulder has been hurting for many years. Shoulder pain would resolve with ibuprofen but now does not help it.    Hypertension    Patient states he no longer is taking the quinapril due to the FDA issuing a warning about it.   COPD   Hyperlipidemia   SHOULDER PAIN Duration:  10-15 years Involved shoulder: right Mechanism of injury: unknown Location: anterior Onset:gradual Severity: mild  Quality:  sharp Frequency: intermittent Radiation: no Aggravating factors: movement  Alleviating factors: NSAIDs  Status: stable Treatments attempted:  Naproxen and ibuprofen  Relief with NSAIDs?:  moderate Weakness: no Numbness: no Decreased grip strength: no Redness: no Swelling: no Bruising: no Fevers: no  HYPERTENSION / HYPERLIPIDEMIA Satisfied with current treatment? yes Duration of hypertension: years BP monitoring frequency: daily BP range: 120/60s BP medication side effects: no Past BP meds:  metoprolol, amlodipine, HCTZ, and quinapril Duration of hyperlipidemia: years Cholesterol medication side effects: no Cholesterol supplements: none Past cholesterol medications: atorvastain (lipitor) Medication compliance: excellent compliance Aspirin: no Recent stressors: no Recurrent headaches: no Visual changes: no Palpitations: no Dyspnea: no Chest pain: no Lower extremity edema: no Dizzy/lightheaded: no  COPD COPD status: controlled Satisfied with current treatment?: no Oxygen use: no Dyspnea frequency:  Cough frequency:  Rescue inhaler frequency:   Limitation of activity: no Productive cough:  Last Spirometry:  Pneumovax: Up to  Date Influenza: Up to Date  Relevant past medical, surgical, family and social history reviewed and updated as indicated. Interim medical history since our last visit reviewed. Allergies and medications reviewed and updated.  Review of Systems  Eyes:  Negative for visual disturbance.  Respiratory:  Negative for chest tightness and shortness of breath.   Cardiovascular:  Negative for chest pain, palpitations and leg swelling.  Musculoskeletal:        Right shoulder pain  Neurological:  Negative for dizziness, light-headedness and headaches.   Per HPI unless specifically indicated above     Objective:    BP 123/70    Pulse 90    Temp 98.2 F (36.8 C)    Ht 5' 9"  (1.753 m)    Wt 215 lb (97.5 kg)    SpO2 97%    BMI 31.75 kg/m   Wt Readings from Last 3 Encounters:  06/01/21 215 lb (97.5 kg)  12/19/20 209 lb 6 oz (95 kg)  09/17/20 205 lb (93 kg)    Physical Exam Vitals and nursing note reviewed.  Constitutional:      General: He is not in acute distress.    Appearance: Normal appearance. He is not ill-appearing, toxic-appearing or diaphoretic.  HENT:     Head: Normocephalic.     Right Ear: External ear normal.     Left Ear: External ear normal.     Nose: Nose normal. No congestion or rhinorrhea.     Mouth/Throat:     Mouth: Mucous membranes are moist.  Eyes:     General:  Right eye: No discharge.        Left eye: No discharge.     Extraocular Movements: Extraocular movements intact.     Conjunctiva/sclera: Conjunctivae normal.     Pupils: Pupils are equal, round, and reactive to light.  Cardiovascular:     Rate and Rhythm: Normal rate and regular rhythm.     Heart sounds: No murmur heard. Pulmonary:     Effort: Pulmonary effort is normal. No respiratory distress.     Breath sounds: Normal breath sounds. No wheezing, rhonchi or rales.  Abdominal:     General: Abdomen is flat. Bowel sounds are normal.  Musculoskeletal:     Right shoulder: No swelling, tenderness  or bony tenderness. Normal range of motion. Normal strength.     Left shoulder: Normal.     Cervical back: Normal range of motion and neck supple.  Skin:    General: Skin is warm and dry.     Capillary Refill: Capillary refill takes less than 2 seconds.  Neurological:     General: No focal deficit present.     Mental Status: He is alert and oriented to person, place, and time.  Psychiatric:        Mood and Affect: Mood normal.        Behavior: Behavior normal.        Thought Content: Thought content normal.        Judgment: Judgment normal.    Results for orders placed or performed in visit on 12/19/20  TSH  Result Value Ref Range   TSH 2.510 0.450 - 4.500 uIU/mL  PSA  Result Value Ref Range   Prostate Specific Ag, Serum 0.9 0.0 - 4.0 ng/mL  Lipid panel  Result Value Ref Range   Cholesterol, Total 194 100 - 199 mg/dL   Triglycerides 56 0 - 149 mg/dL   HDL 104 >39 mg/dL   VLDL Cholesterol Cal 11 5 - 40 mg/dL   LDL Chol Calc (NIH) 79 0 - 99 mg/dL   Chol/HDL Ratio 1.9 0.0 - 5.0 ratio  CBC with Differential/Platelet  Result Value Ref Range   WBC 5.1 3.4 - 10.8 x10E3/uL   RBC 4.73 4.14 - 5.80 x10E6/uL   Hemoglobin 15.2 13.0 - 17.7 g/dL   Hematocrit 43.8 37.5 - 51.0 %   MCV 93 79 - 97 fL   MCH 32.1 26.6 - 33.0 pg   MCHC 34.7 31.5 - 35.7 g/dL   RDW 12.8 11.6 - 15.4 %   Platelets 196 150 - 450 x10E3/uL   Neutrophils 46 Not Estab. %   Lymphs 37 Not Estab. %   Monocytes 10 Not Estab. %   Eos 5 Not Estab. %   Basos 1 Not Estab. %   Neutrophils Absolute 2.4 1.4 - 7.0 x10E3/uL   Lymphocytes Absolute 1.9 0.7 - 3.1 x10E3/uL   Monocytes Absolute 0.5 0.1 - 0.9 x10E3/uL   EOS (ABSOLUTE) 0.3 0.0 - 0.4 x10E3/uL   Basophils Absolute 0.1 0.0 - 0.2 x10E3/uL   Immature Granulocytes 1 Not Estab. %   Immature Grans (Abs) 0.0 0.0 - 0.1 x10E3/uL  Comprehensive metabolic panel  Result Value Ref Range   Glucose 96 65 - 99 mg/dL   BUN 8 8 - 27 mg/dL   Creatinine, Ser 0.69 (L) 0.76 - 1.27  mg/dL   eGFR 101 >59 mL/min/1.73   BUN/Creatinine Ratio 12 10 - 24   Sodium 137 134 - 144 mmol/L   Potassium 4.3 3.5 - 5.2 mmol/L  Chloride 96 96 - 106 mmol/L   CO2 25 20 - 29 mmol/L   Calcium 9.6 8.6 - 10.2 mg/dL   Total Protein 7.0 6.0 - 8.5 g/dL   Albumin 4.7 3.8 - 4.8 g/dL   Globulin, Total 2.3 1.5 - 4.5 g/dL   Albumin/Globulin Ratio 2.0 1.2 - 2.2   Bilirubin Total 0.3 0.0 - 1.2 mg/dL   Alkaline Phosphatase 110 44 - 121 IU/L   AST 37 0 - 40 IU/L   ALT 39 0 - 44 IU/L  Urinalysis, Routine w reflex microscopic  Result Value Ref Range   Specific Gravity, UA 1.020 1.005 - 1.030   pH, UA 5.0 5.0 - 7.5   Color, UA Yellow Yellow   Appearance Ur Clear Clear   Leukocytes,UA Negative Negative   Protein,UA Negative Negative/Trace   Glucose, UA Negative Negative   Ketones, UA Negative Negative   RBC, UA Negative Negative   Bilirubin, UA Negative Negative   Urobilinogen, Ur 0.2 0.2 - 1.0 mg/dL   Nitrite, UA Negative Negative      Assessment & Plan:   Problem List Items Addressed This Visit       Cardiovascular and Mediastinum   Essential hypertension - Primary    Chronic.  Controlled. Consistently within range at home.  Quinapril on recall will change to Lisinopril 34m.  Continue with current medication regimen HCTZ, Amlodipine, and Metoprolol.  Labs ordered today.  Return to clinic in 6 months for reevaluation.  Call sooner if concerns arise.        Relevant Medications   lisinopril (ZESTRIL) 20 MG tablet   Other Relevant Orders   Comp Met (CMET)   Lipid Profile     Respiratory   COPD (chronic obstructive pulmonary disease) (HCC)    Chronic.  Controlled.  Continue with current medication regimen.  Labs ordered today.  Return to clinic in 6 months for reevaluation.  Call sooner if concerns arise.          Other   Hyperlipidemia    Recheck lipids, adjust as needed. Continue Atorvastatin 130mdaily. Follow up in 6 months. Call sooner if concerns arise.      Relevant  Medications   lisinopril (ZESTRIL) 20 MG tablet   Other Relevant Orders   Lipid Profile   Other Visit Diagnoses     Chronic right shoulder pain       Will obtain xray of shoulder. Likely related to arthritis. Will make recommendations based on imaging results.   Relevant Orders   DG Shoulder Right        Follow up plan: Return in about 7 months (around 12/30/2021) for Physical and Fasting labs.  A total of 30 minutes were spent on this encounter today.  When total time is documented, this includes both the face-to-face and non-face-to-face time personally spent before, during and after the visit on the date of the encounter reviewing plan of care, changing blood pressure medication, discussing plan of care for shoulder.

## 2021-06-01 ENCOUNTER — Other Ambulatory Visit: Payer: Self-pay

## 2021-06-01 ENCOUNTER — Ambulatory Visit (INDEPENDENT_AMBULATORY_CARE_PROVIDER_SITE_OTHER): Payer: Medicare Other | Admitting: Nurse Practitioner

## 2021-06-01 ENCOUNTER — Encounter: Payer: Self-pay | Admitting: Nurse Practitioner

## 2021-06-01 ENCOUNTER — Ambulatory Visit
Admission: RE | Admit: 2021-06-01 | Discharge: 2021-06-01 | Disposition: A | Discharge status: Home / Self Care | Payer: Medicare Other | Attending: Nurse Practitioner | Admitting: Nurse Practitioner

## 2021-06-01 ENCOUNTER — Ambulatory Visit
Admission: RE | Admit: 2021-06-01 | Discharge: 2021-06-01 | Disposition: A | Discharge status: Home / Self Care | Payer: Medicare Other | Source: Ambulatory Visit | Attending: Nurse Practitioner | Admitting: Nurse Practitioner

## 2021-06-01 VITALS — BP 123/70 | HR 90 | Temp 98.2°F | Ht 69.0 in | Wt 215.0 lb

## 2021-06-01 DIAGNOSIS — M25511 Pain in right shoulder: Secondary | ICD-10-CM | POA: Insufficient documentation

## 2021-06-01 DIAGNOSIS — I10 Essential (primary) hypertension: Secondary | ICD-10-CM

## 2021-06-01 DIAGNOSIS — E782 Mixed hyperlipidemia: Secondary | ICD-10-CM

## 2021-06-01 DIAGNOSIS — G8929 Other chronic pain: Secondary | ICD-10-CM | POA: Insufficient documentation

## 2021-06-01 DIAGNOSIS — J441 Chronic obstructive pulmonary disease with (acute) exacerbation: Secondary | ICD-10-CM | POA: Diagnosis not present

## 2021-06-01 MED ORDER — LISINOPRIL 20 MG PO TABS: 20.0000 mg | ORAL_TABLET | Freq: Every day | ORAL | 3 refills | Status: DC

## 2021-06-01 NOTE — Assessment & Plan Note (Signed)
Recheck lipids, adjust as needed. Continue Atorvastatin 10mg  daily. Follow up in 6 months. Call sooner if concerns arise.

## 2021-06-01 NOTE — Assessment & Plan Note (Signed)
Chronic.  Controlled.  Continue with current medication regimen.  Labs ordered today.  Return to clinic in 6 months for reevaluation.  Call sooner if concerns arise.  ? ?

## 2021-06-01 NOTE — Progress Notes (Signed)
Hi Jourdyn.  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  I can place a referral for him to see Ortho see if they would be able to do a cortisone injection.  Please let me know your thoughts.

## 2021-06-01 NOTE — Assessment & Plan Note (Signed)
Chronic.  Controlled. Consistently within range at home.  Quinapril on recall will change to Lisinopril 20mg .  Continue with current medication regimen HCTZ, Amlodipine, and Metoprolol.  Labs ordered today.  Return to clinic in 6 months for reevaluation.  Call sooner if concerns arise.

## 2021-06-02 LAB — COMPREHENSIVE METABOLIC PANEL
ALT: 44 IU/L (ref 0–44)
AST: 36 IU/L (ref 0–40)
Albumin/Globulin Ratio: 1.9 (ref 1.2–2.2)
Albumin: 4.6 g/dL (ref 3.8–4.8)
Alkaline Phosphatase: 95 IU/L (ref 44–121)
BUN/Creatinine Ratio: 18 (ref 10–24)
BUN: 13 mg/dL (ref 8–27)
Bilirubin Total: 0.4 mg/dL (ref 0.0–1.2)
CO2: 26 mmol/L (ref 20–29)
Calcium: 9.5 mg/dL (ref 8.6–10.2)
Chloride: 98 mmol/L (ref 96–106)
Creatinine, Ser: 0.74 mg/dL — ABNORMAL LOW (ref 0.76–1.27)
Globulin, Total: 2.4 g/dL (ref 1.5–4.5)
Glucose: 93 mg/dL (ref 70–99)
Potassium: 4.4 mmol/L (ref 3.5–5.2)
Sodium: 139 mmol/L (ref 134–144)
Total Protein: 7 g/dL (ref 6.0–8.5)
eGFR: 99 mL/min/{1.73_m2} (ref >59)

## 2021-06-02 LAB — LIPID PANEL
Chol/HDL Ratio: 1.7 ratio (ref 0.0–5.0)
Cholesterol, Total: 204 mg/dL — ABNORMAL HIGH (ref 100–199)
HDL: 122 mg/dL (ref >39)
LDL Chol Calc (NIH): 74 mg/dL (ref 0–99)
Triglycerides: 41 mg/dL (ref 0–149)
VLDL Cholesterol Cal: 8 mg/dL (ref 5–40)

## 2021-06-02 NOTE — Progress Notes (Signed)
HI Charles Bryant. Your lab work looks good.  Cholesterol went up very slightly from prior.  Continue to work on diet and exercise.  Continue with your current medication regimen.  Follow up as discussed.

## 2021-06-09 ENCOUNTER — Other Ambulatory Visit: Payer: Self-pay | Admitting: Nurse Practitioner

## 2021-06-10 NOTE — Telephone Encounter (Signed)
° °  Requested medication (s) are on the active medication list: Microzide is on list, by historical provider, not Hydrochlorathiazide, please assess.  Future visit scheduled: 12/30/21  Notes to clinic:  Please assess.   Requested Prescriptions  Pending Prescriptions Disp Refills   hydrochlorothiazide (HYDRODIURIL) 12.5 MG tablet [Pharmacy Med Name: hydroCHLOROthiazide 12.5 MG Oral Tablet] 90 tablet 3    Sig: TAKE 1 TABLET BY MOUTH  DAILY     Cardiovascular: Diuretics - Thiazide Failed - 06/09/2021 10:33 PM      Failed - Cr in normal range and within 180 days    Creatinine, Ser  Date Value Ref Range Status  06/01/2021 0.74 (L) 0.76 - 1.27 mg/dL Final          Passed - K in normal range and within 180 days    Potassium  Date Value Ref Range Status  06/01/2021 4.4 3.5 - 5.2 mmol/L Final          Passed - Na in normal range and within 180 days    Sodium  Date Value Ref Range Status  06/01/2021 139 134 - 144 mmol/L Final          Passed - Last BP in normal range    BP Readings from Last 1 Encounters:  06/01/21 123/70          Passed - Valid encounter within last 6 months    Recent Outpatient Visits           1 week ago Essential hypertension   [facility] Larae Grooms, NP   5 months ago Annual physical exam   [facility] Larae Grooms, NP   8 months ago Essential hypertension   [facility] Larae Grooms, NP   9 months ago Essential hypertension   [facility] Larae Grooms, NP   10 months ago Essential hypertension   [facility] Larae Grooms, NP       Future Appointments             In 6 months Larae Grooms, NP [facility]

## 2021-06-22 ENCOUNTER — Ambulatory Visit: Payer: Medicare Other | Admitting: Nurse Practitioner

## 2021-06-30 ENCOUNTER — Ambulatory Visit: Payer: Medicare Other | Admitting: Nurse Practitioner

## 2021-07-06 ENCOUNTER — Ambulatory Visit: Payer: Medicare Other | Admitting: Nurse Practitioner

## 2021-07-27 ENCOUNTER — Other Ambulatory Visit: Payer: Self-pay | Admitting: Nurse Practitioner

## 2021-07-28 NOTE — Telephone Encounter (Signed)
Requested Prescriptions  ?Pending Prescriptions Disp Refills  ?? metoprolol succinate (TOPROL-XL) 50 MG 24 hr tablet [Pharmacy Med Name: Metoprolol Succinate ER 50 MG Oral Tablet Extended Release 24 Hour] 90 tablet 2  ?  Sig: TAKE 1 TABLET BY MOUTH DAILY  WITH OR IMMEDIATELY FOLLOWING A  MEAL  ?  ? Cardiovascular:  Beta Blockers Passed - 07/27/2021  6:11 AM  ?  ?  Passed - Last BP in normal range  ?  BP Readings from Last 1 Encounters:  ?06/01/21 123/70  ?   ?  ?  Passed - Last Heart Rate in normal range  ?  Pulse Readings from Last 1 Encounters:  ?06/01/21 90  ?   ?  ?  Passed - Valid encounter within last 6 months  ?  Recent Outpatient Visits   ?      ? 1 month ago Essential hypertension  ? [facility] Larae Grooms, NP  ? 7 months ago Annual physical exam  ? [facility] Larae Grooms, NP  ? 10 months ago Essential hypertension  ? [facility] Larae Grooms, NP  ? 11 months ago Essential hypertension  ? [facility] Larae Grooms, NP  ? 1 year ago Essential hypertension  ? [facility] Larae Grooms, NP  ?  ?  ?Future Appointments   ?        ? In 5 months Larae Grooms, NP [facility]  ?  ? ?  ?  ?  ? ?

## 2021-09-01 ENCOUNTER — Other Ambulatory Visit: Payer: Self-pay | Admitting: Nurse Practitioner

## 2021-09-03 NOTE — Telephone Encounter (Signed)
Requested Prescriptions  ?Pending Prescriptions Disp Refills  ?? amLODipine (NORVASC) 10 MG tablet [Pharmacy Med Name: amLODIPine Besylate 10 MG Oral Tablet] 90 tablet 3  ?  Sig: TAKE 1 TABLET BY MOUTH  DAILY  ?  ? Cardiovascular: Calcium Channel Blockers 2 Passed - 09/01/2021 11:50 PM  ?  ?  Passed - Last BP in normal range  ?  BP Readings from Last 1 Encounters:  ?06/01/21 123/70  ?   ?  ?  Passed - Last Heart Rate in normal range  ?  Pulse Readings from Last 1 Encounters:  ?06/01/21 90  ?   ?  ?  Passed - Valid encounter within last 6 months  ?  Recent Outpatient Visits   ?      ? 3 months ago Essential hypertension  ? [place], NP  ? 8 months ago Annual physical exam  ? [place], NP  ? 11 months ago Essential hypertension  ? [place], NP  ? 1 year ago Essential hypertension  ? [place], NP  ? 1 year ago Essential hypertension  ? [facility] Jon Billings, NP  ?  ?  ?Future Appointments   ?        ? In 3 months Jon Billings, NP [facility]  ?  ? ?  ?  ?  ? ? ?

## 2021-09-07 HISTORY — PX: CATARACT EXTRACTION: SUR2

## 2021-09-19 ENCOUNTER — Other Ambulatory Visit: Payer: Self-pay | Admitting: Nurse Practitioner

## 2021-09-21 NOTE — Telephone Encounter (Signed)
Requested Prescriptions  ?Pending Prescriptions Disp Refills  ?? WIXELA INHUB 500-50 MCG/ACT AEPB [Pharmacy Med Name: Monte Fantasia Inhub 500-50 MCG/ACT Inhalation Aerosol Powder Breath Activated] 180 each 3  ?  Sig: USE 1 INHALATION BY MOUTH  TWICE DAILY  ?  ? Pulmonology:  Combination Products Passed - 09/19/2021  9:00 AM  ?  ?  Passed - Valid encounter within last 12 months  ?  Recent Outpatient Visits   ?      ? 3 months ago Essential hypertension  ? [facility] Larae Grooms, NP  ? 9 months ago Annual physical exam  ? [facility] Larae Grooms, NP  ? 1 year ago Essential hypertension  ? [facility] Larae Grooms, NP  ? 1 year ago Essential hypertension  ? [facility] Larae Grooms, NP  ? 1 year ago Essential hypertension  ? [facility] Larae Grooms, NP  ?  ?  ?Future Appointments   ?        ? In 3 months Larae Grooms, NP [facility]  ?  ? ?  ?  ?  ? ?

## 2021-11-27 ENCOUNTER — Other Ambulatory Visit: Payer: Self-pay | Admitting: Nurse Practitioner

## 2021-11-27 NOTE — Telephone Encounter (Signed)
Requested medication (s) are due for refill today: expired medication  Requested medication (s) are on the active medication list: yes  Last refill:  12/19/21 #90 3 refills  Future visit scheduled: yes in 1 month  Notes to clinic:  expired medications. Do you want to renew Rxs?     Requested Prescriptions  Pending Prescriptions Disp Refills   omeprazole (PRILOSEC) 40 MG capsule [Pharmacy Med Name: Omeprazole 40 MG Oral Capsule Delayed Release] 90 capsule 3    Sig: TAKE 1 CAPSULE BY MOUTH  DAILY     Gastroenterology: Proton Pump Inhibitors Passed - 11/27/2021  7:31 AM      Passed - Valid encounter within last 12 months    Recent Outpatient Visits           5 months ago Essential hypertension   [facility] Larae Grooms, NP   11 months ago Annual physical exam   [facility] Larae Grooms, NP   1 year ago Essential hypertension   [facility] Larae Grooms, NP   1 year ago Essential hypertension   [facility] Larae Grooms, NP   1 year ago Essential hypertension   [facility] Larae Grooms, NP       Future Appointments             In 1 month Larae Grooms, NP [facility]             atorvastatin (LIPITOR) 10 MG tablet [Pharmacy Med Name: Atorvastatin Calcium 10 MG Oral Tablet] 90 tablet 3    Sig: TAKE 1 TABLET BY MOUTH  DAILY     Cardiovascular:  Antilipid - Statins Failed - 11/27/2021  7:31 AM      Failed - Lipid Panel in normal range within the last 12 months    Cholesterol, Total  Date Value Ref Range Status  06/01/2021 204 (H) 100 - 199 mg/dL Final   LDL Chol Calc (NIH)  Date Value Ref Range Status  06/01/2021 74 0 - 99 mg/dL Final   HDL  Date Value Ref Range Status  06/01/2021 122 >39 mg/dL Final   Triglycerides  Date Value Ref Range Status  06/01/2021 41 0 - 149 mg/dL Final         Passed - Patient is not pregnant      Passed - Valid  encounter within last 12 months    Recent Outpatient Visits           5 months ago Essential hypertension   [facility] Larae Grooms, NP   11 months ago Annual physical exam   [facility] Larae Grooms, NP   1 year ago Essential hypertension   [facility] Larae Grooms, NP   1 year ago Essential hypertension   [facility] Larae Grooms, NP   1 year ago Essential hypertension   [facility] Larae Grooms, NP       Future Appointments             In 1 month Larae Grooms, NP [facility]

## 2021-12-29 NOTE — Progress Notes (Unsigned)
There were no vitals taken for this visit.   Subjective:    Patient ID: Charles Bryant, male    DOB: [birth year], 66 y.o.   MRN: [id number]  HPI: Charles Bryant is a 66 y.o. male presenting on 12/30/2021 for comprehensive medical examination. Current medical complaints include:none  He currently lives with: Interim Problems from his last visit: yes  HYPERTENSION / HYPERLIPIDEMIA Satisfied with current treatment? no Duration of hypertension: years BP monitoring frequency: daily BP range: 120-130/70-80 BP medication side effects: no Past BP meds:  amlodipine, quinapril, HCTZ, Metoprolol Duration of hyperlipidemia: years Cholesterol medication side effects: no Cholesterol supplements: none Past cholesterol medications: atorvastain (lipitor) Medication compliance: excellent compliance Aspirin: yes Recent stressors: no Recurrent headaches: no Visual changes: no Palpitations: no Dyspnea: no Chest pain: no Lower extremity edema: no Dizzy/lightheaded: no  COPD COPD status: controlled Satisfied with current treatment?: yes Oxygen use: no Dyspnea frequency:  Cough frequency:  Rescue inhaler frequency:   Limitation of activity: no Productive cough:  Last Spirometry:  Pneumovax: Up to Date Influenza: Up to Date  Depression Screen done today and results listed below:     06/01/2021    8:14 AM 05/28/2021   10:19 AM 05/23/2020    1:08 PM 12/18/2019    9:42 AM 06/18/2019   10:28 AM  Depression screen PHQ 2/9  Decreased Interest 0 0 0 0 0  Down, Depressed, Hopeless 0 0 0 0 0  PHQ - 2 Score 0 0 0 0 0  Altered sleeping 1   2 1   Tired, decreased energy 0   0 0  Change in appetite 0   0 0  Feeling bad or failure about yourself  0   0 0  Trouble concentrating 0   0 0  Moving slowly or fidgety/restless 0   0 0  Suicidal thoughts 0   0 0  PHQ-9 Score 1   2 1     The patient does not have a history of falls. I did complete a risk assessment for falls. A plan of care for falls was  documented.   Past Medical History:  Past Medical History:  Diagnosis Date  . Allergy Childhood  . Asthma   . COPD (chronic obstructive pulmonary disease) ([place])   . GERD (gastroesophageal reflux disease)   . Hyperlipidemia   . Hypertension     Surgical History:  Past Surgical History:  Procedure Laterality Date  . carotid surgery    . EYE SURGERY  6 years ago   Blepharoplasty    Medications:  Current Outpatient Medications on File Prior to Visit  Medication Sig  . atorvastatin (LIPITOR) 10 MG tablet TAKE 1 TABLET BY MOUTH  DAILY  . omeprazole (PRILOSEC) 40 MG capsule TAKE 1 CAPSULE BY MOUTH  DAILY  . albuterol (VENTOLIN HFA) 108 (90 Base) MCG/ACT inhaler INHALE 1 TO 2 INHALATIONS  BY MOUTH INTO THE LUNGS  TWICE DAILY  . amLODipine (NORVASC) 10 MG tablet TAKE 1 TABLET BY MOUTH  DAILY  . aspirin EC 81 MG tablet Take 81 mg by mouth daily. Swallow whole.  . hydrochlorothiazide (HYDRODIURIL) 12.5 MG tablet TAKE 1 TABLET BY MOUTH  DAILY  . hydrochlorothiazide (MICROZIDE) 12.5 MG capsule Take 12.5 mg by mouth daily.  [name] lisinopril (ZESTRIL) 20 MG tablet Take 1 tablet (20 mg total) by mouth daily.  . metoprolol succinate (TOPROL-XL) 50 MG 24 hr tablet TAKE 1 TABLET BY MOUTH DAILY  WITH OR IMMEDIATELY FOLLOWING A  MEAL  . tadalafil (CIALIS)  20 MG tablet Take 1 tablet (20 mg total) by mouth daily as needed for erectile dysfunction.  Grant Ruts INHUB 500-50 MCG/ACT AEPB USE 1 INHALATION BY MOUTH  TWICE DAILY   No current facility-administered medications on file prior to visit.    Allergies:  No Known Allergies  Social History:  Social History   Socioeconomic History  . Marital status: Married    Spouse name: Not on file  . Number of children: Not on file  . Years of education: Not on file  . Highest education level: Not on file  Occupational History  . Not on file  Tobacco Use  . Smoking status: Former  . Smokeless tobacco: Never  Vaping Use  . Vaping Use: Never used   Substance and Sexual Activity  . Alcohol use: Not Currently  . Drug use: Never  . Sexual activity: Yes  Other Topics Concern  . Not on file  Social History Narrative  . Not on file   Social Determinants of Health   Financial Resource Strain: Low Risk  (05/28/2021)   Overall Financial Resource Strain (CARDIA)   . Difficulty of Paying Living Expenses: Not hard at all  Food Insecurity: No Food Insecurity (05/28/2021)   Hunger Vital Sign   . Worried About Charity fundraiser in the Last Year: Never true   . Ran Out of Food in the Last Year: Never true  Transportation Needs: No Transportation Needs (05/28/2021)   PRAPARE - Transportation   . Lack of Transportation (Medical): No   . Lack of Transportation (Non-Medical): No  Physical Activity: Inactive (05/28/2021)   Exercise Vital Sign   . Days of Exercise per Week: 0 days   . Minutes of Exercise per Session: 0 min  Stress: No Stress Concern Present (05/28/2021)   [place]   . Feeling of Stress : Not at all  Social Connections: Moderately Isolated (05/28/2021)   Social Connection and Isolation Panel [NHANES]   . Frequency of Communication with Friends and Family: More than three times a week   . Frequency of Social Gatherings with Friends and Family: Twice a week   . Attends Religious Services: Never   . Active Member of Clubs or Organizations: No   . Attends Archivist Meetings: Never   . Marital Status: Married  Human resources officer Violence: Not At Risk (05/28/2021)   Humiliation, Afraid, Rape, and Kick questionnaire   . Fear of Current or Ex-Partner: No   . Emotionally Abused: No   . Physically Abused: No   . Sexually Abused: No   Social History   Tobacco Use  Smoking Status Former  Smokeless Tobacco Never   Social History   Substance and Sexual Activity  Alcohol Use Not Currently    Family History:  Family History  Problem Relation Age of  Onset  . COPD Mother   . Cancer Father   . Heart disease Maternal Grandmother   . Stroke Maternal Grandfather   . Hyperkalemia Maternal Grandfather     Past medical history, surgical history, medications, allergies, family history and social history reviewed with patient today and changes made to appropriate areas of the chart.   Review of Systems  Eyes:  Negative for blurred vision and double vision.  Respiratory:  Negative for shortness of breath.   Cardiovascular:  Negative for chest pain, palpitations and leg swelling.  Neurological:  Negative for dizziness and headaches.  All other ROS negative except what  is listed above and in the HPI.      Objective:    There were no vitals taken for this visit.  Wt Readings from Last 3 Encounters:  06/01/21 215 lb (97.5 kg)  12/19/20 209 lb 6 oz (95 kg)  09/17/20 205 lb (93 kg)    Physical Exam Vitals and nursing note reviewed.  Constitutional:      General: He is not in acute distress.    Appearance: Normal appearance. He is not ill-appearing, toxic-appearing or diaphoretic.  HENT:     Head: Normocephalic.     Right Ear: Tympanic membrane, ear canal and external ear normal.     Left Ear: Tympanic membrane, ear canal and external ear normal.     Nose: Nose normal. No congestion or rhinorrhea.     Mouth/Throat:     Mouth: Mucous membranes are moist.  Eyes:     General:        Right eye: No discharge.        Left eye: No discharge.     Extraocular Movements: Extraocular movements intact.     Conjunctiva/sclera: Conjunctivae normal.     Pupils: Pupils are equal, round, and reactive to light.  Cardiovascular:     Rate and Rhythm: Normal rate and regular rhythm.     Heart sounds: No murmur heard. Pulmonary:     Effort: Pulmonary effort is normal. No respiratory distress.     Breath sounds: Normal breath sounds. No wheezing, rhonchi or rales.  Abdominal:     General: Abdomen is flat. Bowel sounds are normal. There is no  distension.     Palpations: Abdomen is soft.     Tenderness: There is no abdominal tenderness. There is no guarding.  Musculoskeletal:     Cervical back: Normal range of motion and neck supple.  Skin:    General: Skin is warm and dry.     Capillary Refill: Capillary refill takes less than 2 seconds.  Neurological:     General: No focal deficit present.     Mental Status: He is alert and oriented to person, place, and time.     Cranial Nerves: No cranial nerve deficit.     Motor: No weakness.     Deep Tendon Reflexes: Reflexes normal.  Psychiatric:        Mood and Affect: Mood normal.        Behavior: Behavior normal.        Thought Content: Thought content normal.        Judgment: Judgment normal.    Results for orders placed or performed in visit on 06/01/21  Comp Met (CMET)  Result Value Ref Range   Glucose 93 70 - 99 mg/dL   BUN 13 8 - 27 mg/dL   Creatinine, Ser 0.74 (L) 0.76 - 1.27 mg/dL   eGFR 99 >59 mL/min/1.73   BUN/Creatinine Ratio 18 10 - 24   Sodium 139 134 - 144 mmol/L   Potassium 4.4 3.5 - 5.2 mmol/L   Chloride 98 96 - 106 mmol/L   CO2 26 20 - 29 mmol/L   Calcium 9.5 8.6 - 10.2 mg/dL   Total Protein 7.0 6.0 - 8.5 g/dL   Albumin 4.6 3.8 - 4.8 g/dL   Globulin, Total 2.4 1.5 - 4.5 g/dL   Albumin/Globulin Ratio 1.9 1.2 - 2.2   Bilirubin Total 0.4 0.0 - 1.2 mg/dL   Alkaline Phosphatase 95 44 - 121 IU/L   AST 36 0 - 40 IU/L  ALT 44 0 - 44 IU/L  Lipid Profile  Result Value Ref Range   Cholesterol, Total 204 (H) 100 - 199 mg/dL   Triglycerides 41 0 - 149 mg/dL   HDL 122 >39 mg/dL   VLDL Cholesterol Cal 8 5 - 40 mg/dL   LDL Chol Calc (NIH) 74 0 - 99 mg/dL   Chol/HDL Ratio 1.7 0.0 - 5.0 ratio      Assessment & Plan:   Problem List Items Addressed This Visit      Cardiovascular and Mediastinum   Essential hypertension - Primary     Respiratory   COPD (chronic obstructive pulmonary disease) (HCC)     Other   Hyperlipidemia     Discussed aspirin  prophylaxis for myocardial infarction prevention and decision was made to continue ASA  LABORATORY TESTING:  Health maintenance labs ordered today as discussed above.   The natural history of prostate cancer and ongoing controversy regarding screening and potential treatment outcomes of prostate cancer has been discussed with the patient. The meaning of a false positive PSA and a false negative PSA has been discussed. He indicates understanding of the limitations of this screening test and wishes to proceed with screening PSA testing.   IMMUNIZATIONS:   - Tdap: Tetanus vaccination status reviewed: not up to date. - Influenza: Postponed to flu season - Pneumovax: Up to date - Prevnar: Up to date - HPV: Not applicable - Zostavax vaccine:  not up to date  SCREENING: - Colonoscopy: Up to date  Discussed with patient purpose of the colonoscopy is to detect colon cancer at curable precancerous or early stages   - AAA Screening: Not applicable  -Hearing Test: Not applicable  -Spirometry: Not applicable   PATIENT COUNSELING:    Sexuality: Discussed sexually transmitted diseases, partner selection, use of condoms, avoidance of unintended pregnancy  and contraceptive alternatives.   Advised to avoid cigarette smoking.  I discussed with the patient that most people either abstain from alcohol or drink within safe limits (<=14/week and <=4 drinks/occasion for males, <=7/weeks and <= 3 drinks/occasion for females) and that the risk for alcohol disorders and other health effects rises proportionally with the number of drinks per week and how often a drinker exceeds daily limits.  Discussed cessation/primary prevention of drug use and availability of treatment for abuse.   Diet: Encouraged to adjust caloric intake to maintain  or achieve ideal body weight, to reduce intake of dietary saturated fat and total fat, to limit sodium intake by avoiding high sodium foods and not adding table salt, and to  maintain adequate dietary potassium and calcium preferably from fresh fruits, vegetables, and low-fat dairy products.    stressed the importance of regular exercise  Injury prevention: Discussed safety belts, safety helmets, smoke detector, smoking near bedding or upholstery.   Dental health: Discussed importance of regular tooth brushing, flossing, and dental visits.   Follow up plan: NEXT PREVENTATIVE PHYSICAL DUE IN 1 YEAR. No follow-ups on file.

## 2021-12-30 ENCOUNTER — Encounter: Payer: Self-pay | Admitting: Nurse Practitioner

## 2021-12-30 ENCOUNTER — Ambulatory Visit (INDEPENDENT_AMBULATORY_CARE_PROVIDER_SITE_OTHER): Payer: Medicare Other | Admitting: Nurse Practitioner

## 2021-12-30 VITALS — BP 118/76 | HR 78 | Temp 98.5°F | Ht 69.29 in | Wt 221.0 lb

## 2021-12-30 DIAGNOSIS — Z23 Encounter for immunization: Secondary | ICD-10-CM | POA: Diagnosis not present

## 2021-12-30 DIAGNOSIS — I714 Abdominal aortic aneurysm, without rupture, unspecified: Secondary | ICD-10-CM | POA: Insufficient documentation

## 2021-12-30 DIAGNOSIS — Z87891 Personal history of nicotine dependence: Secondary | ICD-10-CM

## 2021-12-30 DIAGNOSIS — I10 Essential (primary) hypertension: Secondary | ICD-10-CM

## 2021-12-30 DIAGNOSIS — E782 Mixed hyperlipidemia: Secondary | ICD-10-CM | POA: Diagnosis not present

## 2021-12-30 DIAGNOSIS — T8130XD Disruption of wound, unspecified, subsequent encounter: Secondary | ICD-10-CM

## 2021-12-30 DIAGNOSIS — I6521 Occlusion and stenosis of right carotid artery: Secondary | ICD-10-CM

## 2021-12-30 DIAGNOSIS — Z0000 Encounter for general adult medical examination without abnormal findings: Secondary | ICD-10-CM

## 2021-12-30 DIAGNOSIS — I6529 Occlusion and stenosis of unspecified carotid artery: Secondary | ICD-10-CM | POA: Insufficient documentation

## 2021-12-30 DIAGNOSIS — J441 Chronic obstructive pulmonary disease with (acute) exacerbation: Secondary | ICD-10-CM

## 2021-12-30 LAB — URINALYSIS, ROUTINE W REFLEX MICROSCOPIC
Bilirubin, UA: NEGATIVE
Glucose, UA: NEGATIVE
Ketones, UA: NEGATIVE
Leukocytes,UA: NEGATIVE
Nitrite, UA: NEGATIVE
Protein,UA: NEGATIVE
RBC, UA: NEGATIVE
Specific Gravity, UA: 1.02 (ref 1.005–1.030)
Urobilinogen, Ur: 1 mg/dL (ref 0.2–1.0)
pH, UA: 7 (ref 5.0–7.5)

## 2021-12-30 MED ORDER — ALBUTEROL SULFATE HFA 108 (90 BASE) MCG/ACT IN AERS: INHALATION_SPRAY | RESPIRATORY_TRACT | 3 refills | Status: DC

## 2021-12-30 NOTE — Assessment & Plan Note (Signed)
Chronic.  Controlled.  Continue with current medication regimen.  Has 30 pack history of smoking.  Low Dose CT Lung ordered.  Labs ordered today.  Return to clinic in 6 months for reevaluation.  Call sooner if concerns arise.

## 2021-12-30 NOTE — Assessment & Plan Note (Signed)
[place] ordered for evaluation.  Referral placed for patient to see Cardiology.

## 2021-12-30 NOTE — Assessment & Plan Note (Signed)
Chronic.  Controlled. Consistently within range at home.   Continue with current medication regimen HCTZ, Amlodipine, Lisinopril, and Metoprolol.  Labs ordered today.  Return to clinic in 6 months for reevaluation.  Call sooner if concerns arise.

## 2021-12-30 NOTE — Assessment & Plan Note (Signed)
Has history of AAA.  Has not had recent US for evaluation.  [place] ordered at visit today.  Referral to Cardiology placed.  He previously has had stress tests performed.

## 2021-12-30 NOTE — Assessment & Plan Note (Signed)
Chronic.  Controlled.  Continue with current medication regimen of Atorvastatin daily.  Labs ordered today.  Return to clinic in 6 months for reevaluation.  Call sooner if concerns arise.   

## 2021-12-31 LAB — COMPREHENSIVE METABOLIC PANEL
ALT: 48 IU/L — ABNORMAL HIGH (ref 0–44)
AST: 36 IU/L (ref 0–40)
Albumin/Globulin Ratio: 2.1 (ref 1.2–2.2)
Albumin: 4.8 g/dL (ref 3.9–4.9)
Alkaline Phosphatase: 110 IU/L (ref 44–121)
BUN/Creatinine Ratio: 20 (ref 10–24)
BUN: 14 mg/dL (ref 8–27)
Bilirubin Total: 0.5 mg/dL (ref 0.0–1.2)
CO2: 20 mmol/L (ref 20–29)
Calcium: 10.1 mg/dL (ref 8.6–10.2)
Chloride: 93 mmol/L — ABNORMAL LOW (ref 96–106)
Creatinine, Ser: 0.69 mg/dL — ABNORMAL LOW (ref 0.76–1.27)
Globulin, Total: 2.3 g/dL (ref 1.5–4.5)
Glucose: 103 mg/dL — ABNORMAL HIGH (ref 70–99)
Potassium: 4.8 mmol/L (ref 3.5–5.2)
Sodium: 134 mmol/L (ref 134–144)
Total Protein: 7.1 g/dL (ref 6.0–8.5)
eGFR: 101 mL/min/{1.73_m2} (ref >59)

## 2021-12-31 LAB — CBC WITH DIFFERENTIAL/PLATELET
Basophils Absolute: 0 10*3/uL (ref 0.0–0.2)
Basos: 1 %
EOS (ABSOLUTE): 0.2 10*3/uL (ref 0.0–0.4)
Eos: 4 %
Hematocrit: 42 % (ref 37.5–51.0)
Hemoglobin: 14.3 g/dL (ref 13.0–17.7)
Immature Grans (Abs): 0.1 10*3/uL (ref 0.0–0.1)
Immature Granulocytes: 1 %
Lymphocytes Absolute: 1.7 10*3/uL (ref 0.7–3.1)
Lymphs: 27 %
MCH: 31.3 pg (ref 26.6–33.0)
MCHC: 34 g/dL (ref 31.5–35.7)
MCV: 92 fL (ref 79–97)
Monocytes Absolute: 0.5 10*3/uL (ref 0.1–0.9)
Monocytes: 7 %
Neutrophils Absolute: 3.9 10*3/uL (ref 1.4–7.0)
Neutrophils: 60 %
Platelets: 234 10*3/uL (ref 150–450)
RBC: 4.57 x10E6/uL (ref 4.14–5.80)
RDW: 12.8 % (ref 11.6–15.4)
WBC: 6.4 10*3/uL (ref 3.4–10.8)

## 2021-12-31 LAB — LIPID PANEL
Chol/HDL Ratio: 1.9 ratio (ref 0.0–5.0)
Cholesterol, Total: 222 mg/dL — ABNORMAL HIGH (ref 100–199)
HDL: 114 mg/dL (ref >39)
LDL Chol Calc (NIH): 92 mg/dL (ref 0–99)
Triglycerides: 96 mg/dL (ref 0–149)
VLDL Cholesterol Cal: 16 mg/dL (ref 5–40)

## 2021-12-31 LAB — TSH: TSH: 3.32 u[IU]/mL (ref 0.450–4.500)

## 2021-12-31 LAB — PSA: Prostate Specific Ag, Serum: 0.9 ng/mL (ref 0.0–4.0)

## 2021-12-31 NOTE — Progress Notes (Signed)
Hi Charles Bryant. It was good to see you yesterday.  Overall your lab work looks good.  Your cholesterol is slightly elevated.  I recommend a low fat diet.  No other concerns at this time.  Continue with your current medication regimen.  Follow up as discussed.

## 2022-01-01 ENCOUNTER — Telehealth: Payer: Self-pay

## 2022-01-01 NOTE — Telephone Encounter (Signed)
[facility] [employer] faxed paper stating Ventolin HFA is not covered by pt's plan. They are requesting for you to send alternative.   Alternatives as listed:   Albuterol HFA 48mcg/act- generic proair Proventil HFA Aero Inhaler.    Please advise.

## 2022-01-04 MED ORDER — ALBUTEROL SULFATE HFA 108 (90 BASE) MCG/ACT IN AERS: 2.0000 | INHALATION_SPRAY | Freq: Four times a day (QID) | RESPIRATORY_TRACT | 1 refills | Status: DC | PRN

## 2022-01-15 ENCOUNTER — Ambulatory Visit: Payer: Medicare Other | Attending: Cardiovascular Disease | Admitting: Cardiovascular Disease

## 2022-01-15 ENCOUNTER — Encounter: Payer: Self-pay | Admitting: Cardiovascular Disease

## 2022-01-15 VITALS — BP 130/72 | HR 86 | Ht 70.0 in | Wt 217.6 lb

## 2022-01-15 DIAGNOSIS — I6521 Occlusion and stenosis of right carotid artery: Secondary | ICD-10-CM | POA: Diagnosis not present

## 2022-01-15 DIAGNOSIS — Z720 Tobacco use: Secondary | ICD-10-CM

## 2022-01-15 DIAGNOSIS — E782 Mixed hyperlipidemia: Secondary | ICD-10-CM | POA: Diagnosis not present

## 2022-01-15 DIAGNOSIS — I10 Essential (primary) hypertension: Secondary | ICD-10-CM | POA: Diagnosis not present

## 2022-01-15 NOTE — Patient Instructions (Signed)
Medication Instructions:  Your physician recommends that you continue on your current medications as directed. Please refer to the Current Medication list given to you today.  *If you need a refill on your cardiac medications before your next appointment, please call your pharmacy*   Testing/Procedures: Your physician has requested that you have a carotid duplex. This test is an ultrasound of the carotid arteries in your neck. It looks at blood flow through these arteries that supply the brain with blood. Allow one hour for this exam. There are no restrictions or special instructions. This procedure will be done at [address]   Dr. Allyson Sabal has ordered a CT coronary calcium score.   Test locations:   [facility], 2nd Floor)  This is $99 out of pocket.   Coronary CalciumScan A coronary calcium scan is an imaging test used to look for deposits of calcium and other fatty materials (plaques) in the inner lining of the blood vessels of the heart (coronary arteries). These deposits of calcium and plaques can partly clog and narrow the coronary arteries without producing any symptoms or warning signs. This puts a person at risk for a heart attack. This test can detect these deposits before symptoms develop. Tell a health care provider about: Any allergies you have. All medicines you are taking, including vitamins, herbs, eye drops, creams, and over-the-counter medicines. Any problems you or family members have had with anesthetic medicines. Any blood disorders you have. Any surgeries you have had. Any medical conditions you have. Whether you are pregnant or may be pregnant. What are the risks? Generally, this is a safe procedure. However, problems may occur, including: Harm to a pregnant woman and her unborn baby. This test involves the use of radiation. Radiation exposure can be dangerous to a pregnant woman and her unborn baby. If you are  pregnant, you generally should not have this procedure done. Slight increase in the risk of cancer. This is because of the radiation involved in the test. What happens before the procedure? No preparation is needed for this procedure. What happens during the procedure? You will undress and remove any jewelry around your neck or chest. You will put on a hospital gown. Sticky electrodes will be placed on your chest. The electrodes will be connected to an electrocardiogram (ECG) machine to record a tracing of the electrical activity of your heart. A CT scanner will take pictures of your heart. During this time, you will be asked to lie still and hold your breath for 2-3 seconds while a picture of your heart is being taken. The procedure may vary among health care providers and hospitals. What happens after the procedure? You can get dressed. You can return to your normal activities. It is up to you to get the results of your test. Ask your health care provider, or the department that is doing the test, when your results will be ready. Summary A coronary calcium scan is an imaging test used to look for deposits of calcium and other fatty materials (plaques) in the inner lining of the blood vessels of the heart (coronary arteries). Generally, this is a safe procedure. Tell your health care provider if you are pregnant or may be pregnant. No preparation is needed for this procedure. A CT scanner will take pictures of your heart. You can return to your normal activities after the scan is done. This information is not intended to replace advice given to you by your health care  provider. Make sure you discuss any questions you have with your health care provider. Document Released: 10/23/2007 Document Revised: 03/15/2016 Document Reviewed: 03/15/2016 Elsevier Interactive Patient Education  2017 [employer].    Follow-Up: At [facility], you and your health needs are our priority.  As  part of our continuing mission to provide you with exceptional heart care, we have created designated Provider Care Teams.  These Care Teams include your primary Cardiologist (physician) and Advanced Practice Providers (APPs -  Physician Assistants and Nurse Practitioners) who all work together to provide you with the care you need, when you need it.  We recommend signing up for the patient portal called "MyChart".  Sign up information is provided on this After Visit Summary.  MyChart is used to connect with patients for Virtual Visits (Telemedicine).  Patients are able to view lab/test results, encounter notes, upcoming appointments, etc.  Non-urgent messages can be sent to your provider as well.   To learn more about what you can do with MyChart, go to [email or web address].    Your next appointment:   12 month(s)  The format for your next appointment:   In Person  Provider:   Nanetta Batty, MD

## 2022-01-15 NOTE — Assessment & Plan Note (Signed)
History of hyperlipidemia on low-dose statin therapy with lipid profile performed 12/30/2021 revealing a total cholesterol 222, LDL 92 and HDL of 114.  I am going to get a coronary calcium score to further risk stratify the need for more intensive statin therapy.

## 2022-01-15 NOTE — Progress Notes (Signed)
01/15/2022 Charles Bryant   31-May-1955  [id number]  Primary Physician Larae Grooms, NP Primary Cardiologist: Runell Gess MD Nicholes Calamity, [place]  HPI:  Charles Bryant is a 66 y.o. mildly overweight married Caucasian male father of 2, grandfather of 4 grandchildren referred by Larae Grooms, NP, his primary care provider, for cardiovascular valuation because of known vascular disease.  He relocated from [place] to [place] in May 2020.  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  His wife took a job as Catering manager of the Insurance claims handler at [employer].  His risk factors include discontinue tobacco use in 2020 having smoked 100 pack years, treated hypertension and hyperlipidemia.  There is no family history of heart disease.  Never had an attack or stroke.  He denies chest pain or shortness of breath.  He did have a right carotid endarterectomy for asymptomatic carotid disease in [place] at [facility] in 2008.   Current Meds  Medication Sig   albuterol (VENTOLIN HFA) 108 (90 Base) MCG/ACT inhaler Inhale 2 puffs into the lungs every 6 (six) hours as needed for wheezing or shortness of breath.   amLODipine (NORVASC) 10 MG tablet TAKE 1 TABLET BY MOUTH  DAILY   aspirin EC 81 MG tablet Take 81 mg by mouth daily. Swallow whole.   atorvastatin (LIPITOR) 10 MG tablet TAKE 1 TABLET BY MOUTH  DAILY   hydrochlorothiazide (HYDRODIURIL) 12.5 MG tablet TAKE 1 TABLET BY MOUTH  DAILY   lisinopril (ZESTRIL) 20 MG tablet Take 1 tablet (20 mg total) by mouth daily.   metoprolol succinate (TOPROL-XL) 50 MG 24 hr tablet TAKE 1 TABLET BY MOUTH DAILY  WITH OR IMMEDIATELY FOLLOWING A  MEAL   omeprazole (PRILOSEC) 40 MG capsule TAKE 1 CAPSULE BY MOUTH  DAILY   tadalafil (CIALIS) 20 MG tablet Take 1 tablet (20 mg total) by mouth daily as needed for erectile dysfunction.   WIXELA INHUB 500-50 MCG/ACT AEPB USE 1 INHALATION BY MOUTH  TWICE DAILY     No Known  Allergies  Social History   Socioeconomic History   Marital status: Married    Spouse name: Not on file   Number of children: Not on file   Years of education: Not on file   Highest education level: Not on file  Occupational History   Not on file  Tobacco Use   Smoking status: Former   Smokeless tobacco: Never  Vaping Use   Vaping Use: Never used  Substance and Sexual Activity   Alcohol use: Not Currently   Drug use: Never   Sexual activity: Yes  Other Topics Concern   Not on file  Social History Narrative   Not on file   Social Determinants of Health   Financial Resource Strain: Low Risk  (05/28/2021)   Overall Financial Resource Strain (CARDIA)    Difficulty of Paying Living Expenses: Not hard at all  Food Insecurity: No Food Insecurity (05/28/2021)   Hunger Vital Sign    Worried About Running Out of Food in the Last Year: Never true    Ran Out of Food in the Last Year: Never true  Transportation Needs: No Transportation Needs (05/28/2021)   PRAPARE - Administrator, Civil Service (Medical): No    Lack of Transportation (Non-Medical): No  Physical Activity: Inactive (05/28/2021)   Exercise Vital Sign    Days of Exercise per Week: 0 days    Minutes of Exercise  per Session: 0 min  Stress: No Stress Concern Present (05/28/2021)   [employer] of Occupational Health - Occupational Stress Questionnaire    Feeling of Stress : Not at all  Social Connections: Moderately Isolated (05/28/2021)   Social Connection and Isolation Panel [NHANES]    Frequency of Communication with Friends and Family: More than three times a week    Frequency of Social Gatherings with Friends and Family: Twice a week    Attends Religious Services: Never    Database administrator or Organizations: No    Attends Banker Meetings: Never    Marital Status: Married  Catering manager Violence: Not At Risk (05/28/2021)   Humiliation, Afraid, Rape, and Kick questionnaire     Fear of Current or Ex-Partner: No    Emotionally Abused: No    Physically Abused: No    Sexually Abused: No     Review of Systems: General: negative for chills, fever, night sweats or weight changes.  Cardiovascular: negative for chest pain, dyspnea on exertion, edema, orthopnea, palpitations, paroxysmal nocturnal dyspnea or shortness of breath Dermatological: negative for rash Respiratory: negative for cough or wheezing Urologic: negative for hematuria Abdominal: negative for nausea, vomiting, diarrhea, bright red blood per rectum, melena, or hematemesis Neurologic: negative for visual changes, syncope, or dizziness All other systems reviewed and are otherwise negative except as noted above.    Blood pressure 130/72, pulse 86, height 5\' 10"  (1.778 m), weight 217 lb 9.6 oz (98.7 kg), SpO2 98 %.  General appearance: alert and no distress Neck: no adenopathy, no carotid bruit, no JVD, supple, symmetrical, trachea midline, and thyroid not enlarged, symmetric, no tenderness/mass/nodules Lungs: clear to auscultation bilaterally Heart: regular rate and rhythm, S1, S2 normal, no murmur, click, rub or gallop Extremities: extremities normal, atraumatic, no cyanosis or edema Pulses: 2+ and symmetric Skin: Skin color, texture, turgor normal. No rashes or lesions Neurologic: Grossly normal  EKG sinus rhythm at 86 with incomplete right bundle branch block.  I personally reviewed this EKG.  ASSESSMENT AND PLAN:   Hyperlipidemia History of hyperlipidemia on low-dose statin therapy with lipid profile performed 12/30/2021 revealing a total cholesterol 222, LDL 92 and HDL of 114.  I am going to get a coronary calcium score to further risk stratify the need for more intensive statin therapy.  Essential hypertension History of essential hypertension a blood pressure measured today at 130/72.  He is on amlodipine, lisinopril and hydrochlorothiazide as well as Toprol.  Carotid artery stenosis History  of right carotid endarterectomy back in 2008 in [place] at [facility].  We will obtain carotid Doppler studies to further evaluate.  Tobacco abuse 100 pack years of tobacco abuse having quit in 2020.     2021 MD FACP,FACC,FAHA, [facility] 01/15/2022 3:52 PM

## 2022-01-15 NOTE — Assessment & Plan Note (Signed)
History of essential hypertension a blood pressure measured today at 130/72.  He is on amlodipine, lisinopril and hydrochlorothiazide as well as Toprol.

## 2022-01-15 NOTE — Assessment & Plan Note (Signed)
100 pack years of tobacco abuse having quit in 2020.

## 2022-01-15 NOTE — Assessment & Plan Note (Signed)
History of right carotid endarterectomy back in 2008 in [place] at [facility].  We will obtain carotid Doppler studies to further evaluate.

## 2022-02-01 ENCOUNTER — Ambulatory Visit (HOSPITAL_COMMUNITY)
Admission: RE | Admit: 2022-02-01 | Discharge: 2022-02-01 | Disposition: A | Discharge status: Home / Self Care | Payer: Medicare Other | Source: Ambulatory Visit | Attending: Internal Medicine | Admitting: Internal Medicine

## 2022-02-01 DIAGNOSIS — E782 Mixed hyperlipidemia: Secondary | ICD-10-CM | POA: Diagnosis present

## 2022-02-01 DIAGNOSIS — I6521 Occlusion and stenosis of right carotid artery: Secondary | ICD-10-CM

## 2022-02-19 ENCOUNTER — Ambulatory Visit (HOSPITAL_BASED_OUTPATIENT_CLINIC_OR_DEPARTMENT_OTHER)
Admission: RE | Admit: 2022-02-19 | Discharge: 2022-02-19 | Disposition: A | Discharge status: Home / Self Care | Payer: Medicare Other | Source: Ambulatory Visit | Attending: Cardiovascular Disease | Admitting: Cardiovascular Disease

## 2022-02-19 DIAGNOSIS — E782 Mixed hyperlipidemia: Secondary | ICD-10-CM | POA: Insufficient documentation

## 2022-02-19 DIAGNOSIS — I6521 Occlusion and stenosis of right carotid artery: Secondary | ICD-10-CM | POA: Insufficient documentation

## 2022-02-24 ENCOUNTER — Telehealth: Payer: Self-pay

## 2022-02-24 DIAGNOSIS — E782 Mixed hyperlipidemia: Secondary | ICD-10-CM

## 2022-02-24 MED ORDER — ATORVASTATIN CALCIUM 40 MG PO TABS: 40.0000 mg | ORAL_TABLET | Freq: Every day | ORAL | 3 refills | Status: DC

## 2022-02-24 NOTE — Telephone Encounter (Signed)
-----   Message from Lorretta Harp, MD sent at 02/22/2022  2:48 PM EDT ----- LDL 92 on atorvastatin 10 mg a day.  Coronary calcium score was 317.  Increase atorvastatin to 40 mg a day and recheck FLP in 3 months.  LDL goal less than 70.

## 2022-02-24 NOTE — Telephone Encounter (Signed)
Results of calcium score discussed with pt. Recommendations per Dr. Gwenlyn Found discussed with pt. Pt is ok with medication change. Prescription sent to pt's pharmacy of choice. Lab orders placed and mailed to pt's home address. Pt verbalizes understanding.

## 2022-03-04 ENCOUNTER — Other Ambulatory Visit: Payer: Self-pay | Admitting: Nurse Practitioner

## 2022-03-05 NOTE — Telephone Encounter (Signed)
Unable to refill per protocol, Rx request is too soon. Last refill 06/01/21 for 90 and 3 Rf.E-Prescribing Status: Receipt confirmed by pharmacy (06/01/2021 8:28 AM EST). Will refuse.   Requested Prescriptions  Pending Prescriptions Disp Refills  . lisinopril (ZESTRIL) 20 MG tablet [Pharmacy Med Name: Lisinopril 20 MG Oral Tablet] 90 tablet 3    Sig: TAKE 1 TABLET BY MOUTH DAILY     Cardiovascular:  ACE Inhibitors Failed - 03/04/2022 12:29 PM      Failed - Cr in normal range and within 180 days    Creatinine, Ser  Date Value Ref Range Status  12/30/2021 0.69 (L) 0.76 - 1.27 mg/dL Final         Passed - K in normal range and within 180 days    Potassium  Date Value Ref Range Status  12/30/2021 4.8 3.5 - 5.2 mmol/L Final         Passed - Patient is not pregnant      Passed - Last BP in normal range    BP Readings from Last 1 Encounters:  01/15/22 130/72         Passed - Valid encounter within last 6 months    Recent Outpatient Visits          2 months ago Annual physical exam   [place], NP   9 months ago Essential hypertension   [facility] Jon Billings, NP   1 year ago Annual physical exam   [facility] Jon Billings, NP   1 year ago Essential hypertension   [facility] Jon Billings, NP   1 year ago Essential hypertension   [facility] Jon Billings, NP      Future Appointments            In 3 months Jon Billings, NP [facility], [place]

## 2022-03-08 ENCOUNTER — Telehealth: Payer: Self-pay

## 2022-03-08 MED ORDER — ALBUTEROL SULFATE HFA 108 (90 BASE) MCG/ACT IN AERS: 2.0000 | INHALATION_SPRAY | Freq: Four times a day (QID) | RESPIRATORY_TRACT | 1 refills | Status: DC | PRN

## 2022-03-08 NOTE — Telephone Encounter (Signed)
Patient notified via phone call

## 2022-03-08 NOTE — Telephone Encounter (Signed)
Medication sent to the pharmacy.

## 2022-03-08 NOTE — Telephone Encounter (Signed)
[employer] sent fax stating Ventolin HFA is not covered under pt's insurance. Pharmacy is requesting sending alternative covered medication:   1. Albuertol HFA  2. Proair Respiclick   3. Levalbuterol HFA  Please advise.

## 2022-03-23 ENCOUNTER — Other Ambulatory Visit: Payer: Self-pay | Admitting: Nurse Practitioner

## 2022-03-23 NOTE — Telephone Encounter (Signed)
Requested Prescriptions  Pending Prescriptions Disp Refills   metoprolol succinate (TOPROL-XL) 50 MG 24 hr tablet [Pharmacy Med Name: Metoprolol Succinate ER 50 MG Oral Tablet Extended Release 24 Hour] 90 tablet 1    Sig: TAKE 1 TABLET BY MOUTH DAILY  WITH OR IMMEDIATELY FOLLOWING A  MEAL     Cardiovascular:  Beta Blockers Passed - 03/23/2022  7:34 AM      Passed - Last BP in normal range    BP Readings from Last 1 Encounters:  01/15/22 130/72         Passed - Last Heart Rate in normal range    Pulse Readings from Last 1 Encounters:  01/15/22 86         Passed - Valid encounter within last 6 months    Recent Outpatient Visits           2 months ago Annual physical exam   [facility] Larae Grooms, NP   9 months ago Essential hypertension   [facility] [place] [facility] Larae Grooms, NP   1 year ago Annual physical exam   [facility] Larae Grooms, NP   1 year ago Essential hypertension   [facility] Larae Grooms, NP   1 year ago Essential hypertension   [facility] Larae Grooms, NP       Future Appointments             In 3 months Larae Grooms, NP [facility], [facility]

## 2022-04-08 ENCOUNTER — Other Ambulatory Visit: Payer: Self-pay | Admitting: Nurse Practitioner

## 2022-04-08 NOTE — Telephone Encounter (Signed)
Requested Prescriptions  Pending Prescriptions Disp Refills   lisinopril (ZESTRIL) 20 MG tablet [Pharmacy Med Name: Lisinopril 20 MG Oral Tablet] 90 tablet 1    Sig: TAKE 1 TABLET BY MOUTH DAILY     Cardiovascular:  ACE Inhibitors Failed - 04/08/2022  8:36 AM      Failed - Cr in normal range and within 180 days    Creatinine, Ser  Date Value Ref Range Status  12/30/2021 0.69 (L) 0.76 - 1.27 mg/dL Final         Passed - K in normal range and within 180 days    Potassium  Date Value Ref Range Status  12/30/2021 4.8 3.5 - 5.2 mmol/L Final         Passed - Patient is not pregnant      Passed - Last BP in normal range    BP Readings from Last 1 Encounters:  01/15/22 130/72         Passed - Valid encounter within last 6 months    Recent Outpatient Visits           3 months ago Annual physical exam   [facility] Larae Grooms, NP   10 months ago Essential hypertension   [facility] Larae Grooms, NP   1 year ago Annual physical exam   [facility] Larae Grooms, NP   1 year ago Essential hypertension   [facility] Larae Grooms, NP   1 year ago Essential hypertension   [facility] Larae Grooms, NP       Future Appointments             In 2 months Larae Grooms, NP [facility], [facility]

## 2022-05-17 ENCOUNTER — Other Ambulatory Visit: Payer: Self-pay | Admitting: Nurse Practitioner

## 2022-05-18 NOTE — Telephone Encounter (Signed)
Requested Prescriptions  Pending Prescriptions Disp Refills   hydrochlorothiazide (HYDRODIURIL) 12.5 MG tablet [Pharmacy Med Name: hydroCHLOROthiazide 12.5 MG Oral Tablet] 90 tablet 0    Sig: TAKE 1 TABLET BY MOUTH DAILY     Cardiovascular: Diuretics - Thiazide Failed - 05/17/2022 10:19 PM      Failed - Cr in normal range and within 180 days    Creatinine, Ser  Date Value Ref Range Status  12/30/2021 0.69 (L) 0.76 - 1.27 mg/dL Final         Passed - K in normal range and within 180 days    Potassium  Date Value Ref Range Status  12/30/2021 4.8 3.5 - 5.2 mmol/L Final         Passed - Na in normal range and within 180 days    Sodium  Date Value Ref Range Status  12/30/2021 134 134 - 144 mmol/L Final         Passed - Last BP in normal range    BP Readings from Last 1 Encounters:  01/15/22 130/72         Passed - Valid encounter within last 6 months    Recent Outpatient Visits           4 months ago Annual physical exam   [facility] Jon Billings, NP   11 months ago Essential hypertension   [facility] Jon Billings, NP   1 year ago Annual physical exam   [facility] Jon Billings, NP   1 year ago Essential hypertension   [facility] Jon Billings, NP   1 year ago Essential hypertension   [facility] Jon Billings, NP       Future Appointments             In 1 month Jon Billings, NP [facility], [place]

## 2022-05-20 IMAGING — DX DG SHOULDER 2+V*R*
4 series · 4 of 4 positions shown · non-contrast
Comparison: None.

CLINICAL DATA: Chronic right shoulder pain.

EXAM:
RIGHT SHOULDER - 2+ VIEW

[shoulder y-view (1 of 2)]
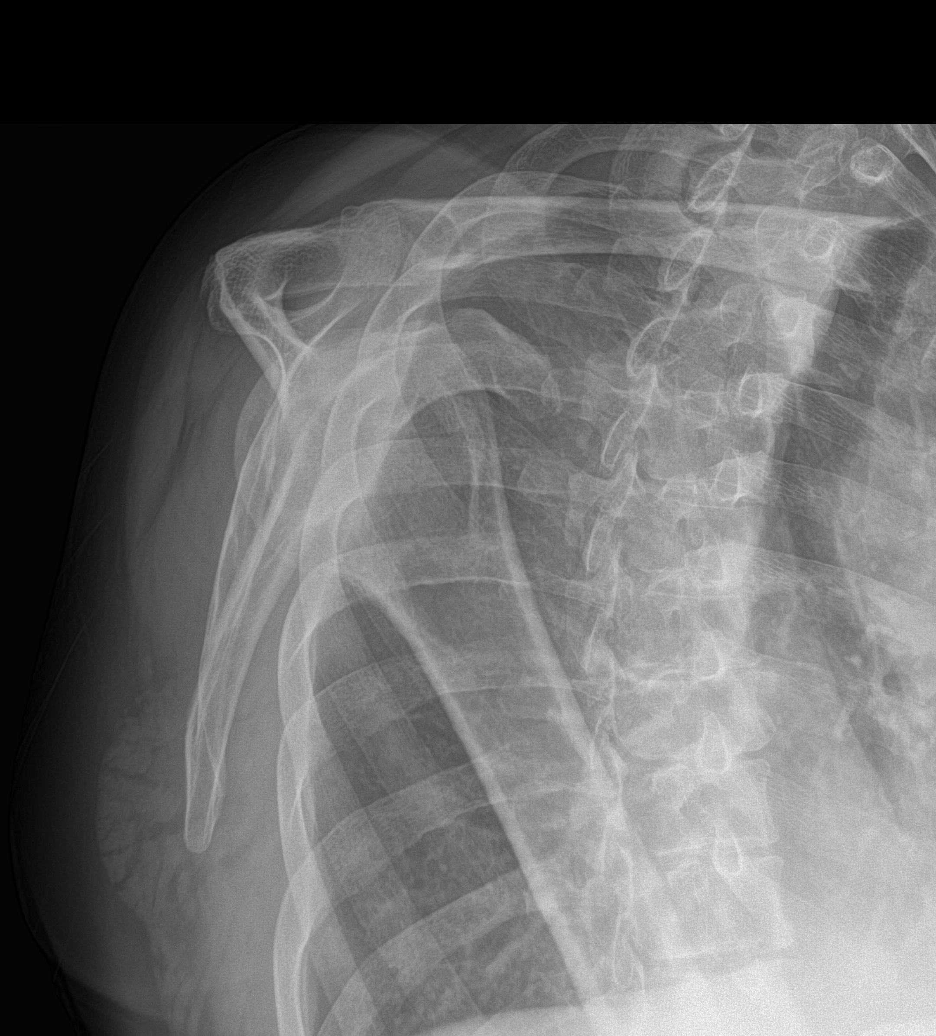

[shoulder axial]
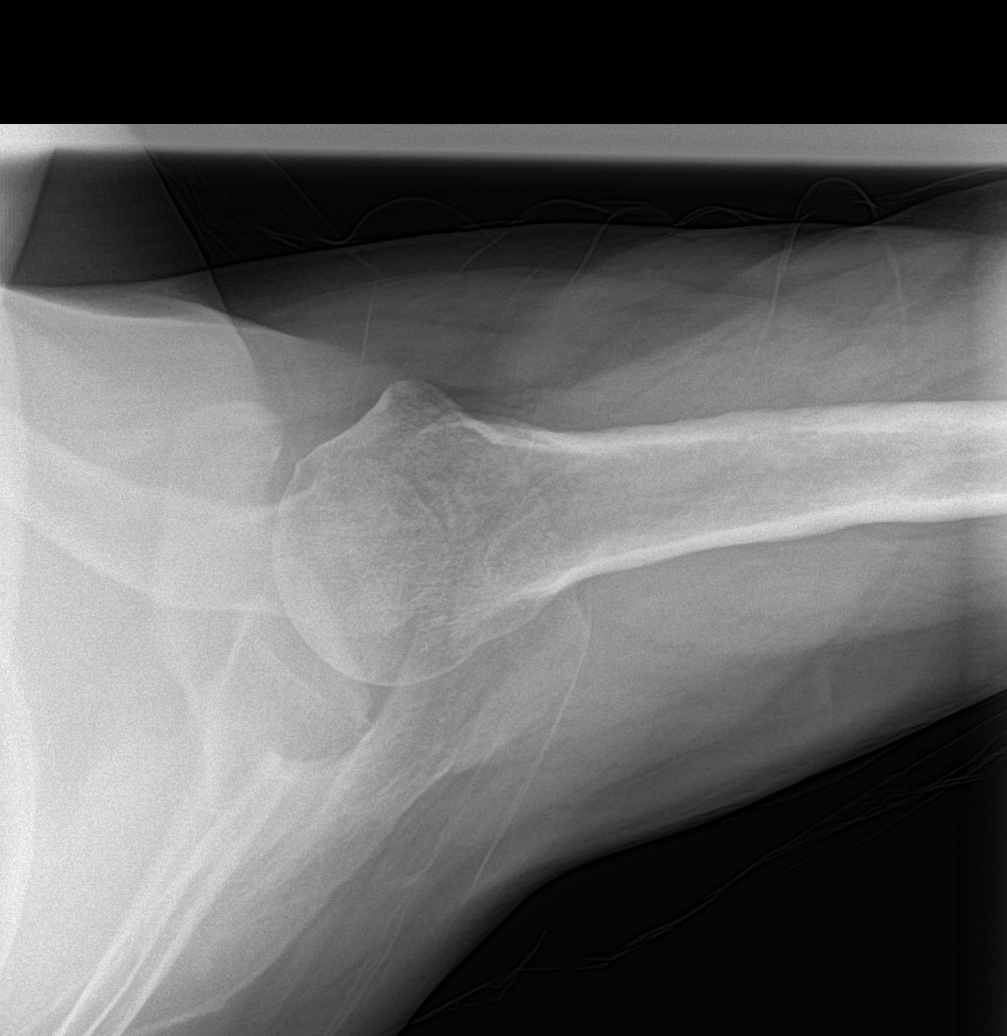

[shoulder ap]
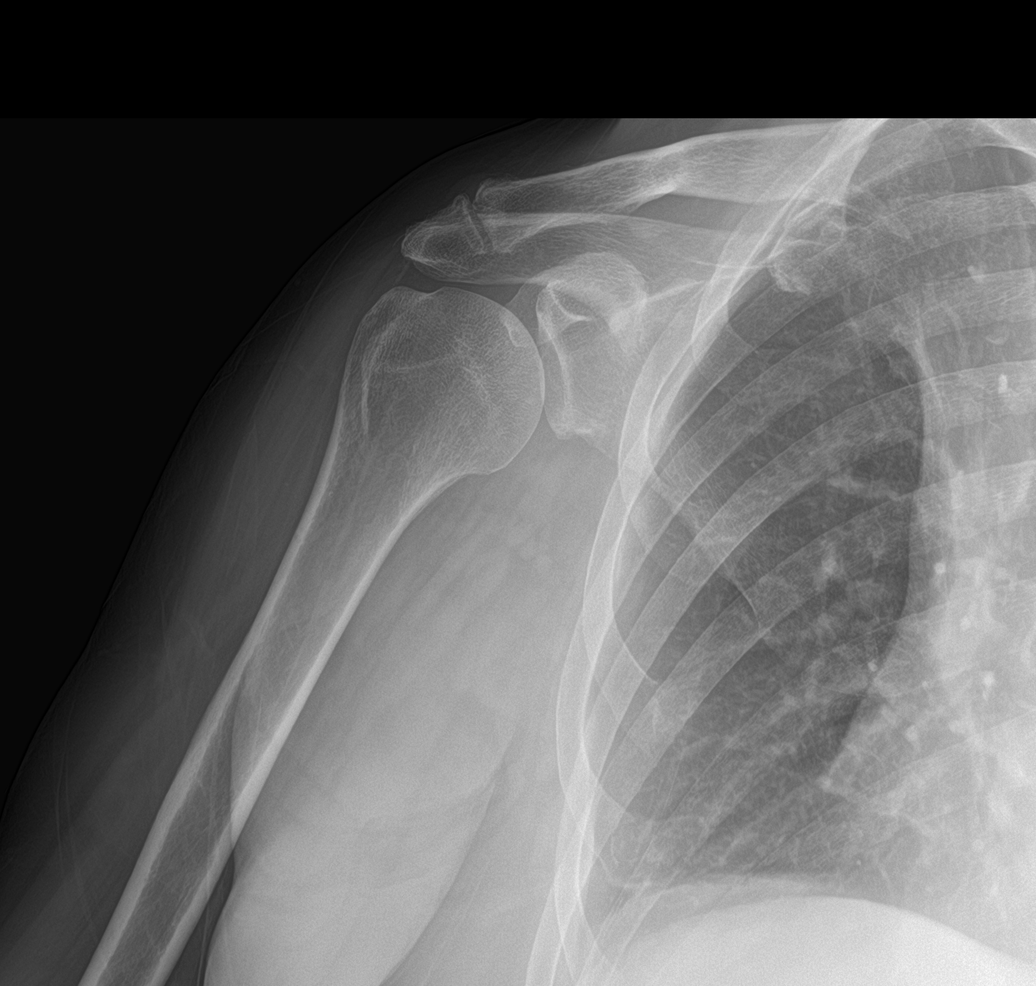

[shoulder y-view (2 of 2)]
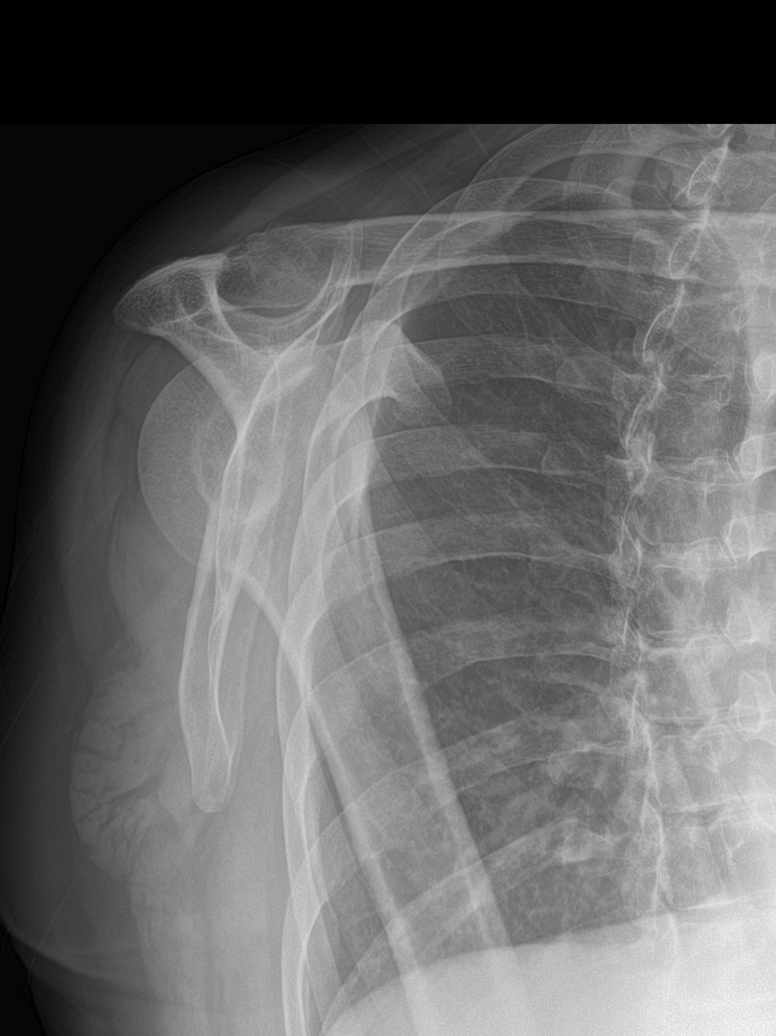

[4 of 4 positions shown; findings below may reference images not displayed]

FINDINGS: Mildly decreased bone mineralization. Moderate acromioclavicular
joint space narrowing with mild peripheral degenerative
osteophytosis. No acute fracture or dislocation. The visualized
portion of the right lung is unremarkable.
IMPRESSION: Mild-to-moderate acromioclavicular osteoarthritis.

## 2022-06-13 ENCOUNTER — Other Ambulatory Visit: Payer: Self-pay | Admitting: Nurse Practitioner

## 2022-06-14 NOTE — Telephone Encounter (Signed)
Rx was sent to pharmacy on 05/18/22 #90/0. Pt has upcoming appt.   Requested Prescriptions  Pending Prescriptions Disp Refills   hydrochlorothiazide (HYDRODIURIL) 12.5 MG tablet [Pharmacy Med Name: hydroCHLOROthiazide 12.5 MG Oral Tablet] 90 tablet 3    Sig: TAKE 1 TABLET BY MOUTH DAILY     Cardiovascular: Diuretics - Thiazide Failed - 06/13/2022  1:40 PM      Failed - Cr in normal range and within 180 days    Creatinine, Ser  Date Value Ref Range Status  12/30/2021 0.69 (L) 0.76 - 1.27 mg/dL Final         Passed - K in normal range and within 180 days    Potassium  Date Value Ref Range Status  12/30/2021 4.8 3.5 - 5.2 mmol/L Final         Passed - Na in normal range and within 180 days    Sodium  Date Value Ref Range Status  12/30/2021 134 134 - 144 mmol/L Final         Passed - Last BP in normal range    BP Readings from Last 1 Encounters:  01/15/22 130/72         Passed - Valid encounter within last 6 months    Recent Outpatient Visits           5 months ago Annual physical exam   [facility] Jon Billings, NP   1 year ago Essential hypertension   [place] Jon Billings, NP   1 year ago Annual physical exam   [place] Jon Billings, NP   1 year ago Essential hypertension   [place] Jon Billings, NP   1 year ago Essential hypertension   [place] Jon Billings, NP       Future Appointments             In 2 weeks Jon Billings, NP [place], [facility]

## 2022-07-01 NOTE — Progress Notes (Signed)
BP 137/71   Pulse 85   Temp 98.1 F (36.7 C) (Oral)   Wt 217 lb 14.4 oz (98.8 kg)   SpO2 96%   BMI 31.27 kg/m    Subjective:    Patient ID: Charles Bryant, male    DOB: 1955/07/23, 67 y.o.   MRN: [id number]  HPI: Tyke Fouty is a 67 y.o. male presenting on 07/02/2022 for comprehensive medical examination. Current medical complaints include:none  He currently lives with: Interim Problems from his last visit: no  HYPERTENSION / HYPERLIPIDEMIA Satisfied with current treatment? no Duration of hypertension: years BP monitoring frequency: daily BP range: 120-130/70-80 BP medication side effects: no Past BP meds:  amlodipine, quinapril, HCTZ, Metoprolol Duration of hyperlipidemia: years Cholesterol medication side effects: no Cholesterol supplements: none Past cholesterol medications: atorvastain (lipitor) Medication compliance: excellent compliance Aspirin: yes Recent stressors: no Recurrent headaches: no Visual changes: no Palpitations: no Dyspnea: no Chest pain: no Lower extremity edema: no Dizzy/lightheaded: no   COPD COPD status: controlled Satisfied with current treatment?: yes Oxygen use: no Dyspnea frequency:  Cough frequency:  Rescue inhaler frequency:   Limitation of activity: no Productive cough:  Last Spirometry:  Pneumovax: Up to Date Influenza: Up to Date  Functional Status Survey: Is the patient deaf or have difficulty hearing?: No Does the patient have difficulty seeing, even when wearing glasses/contacts?: No Does the patient have difficulty concentrating, remembering, or making decisions?: No Does the patient have difficulty walking or climbing stairs?: No Does the patient have difficulty dressing or bathing?: No Does the patient have difficulty doing errands alone such as visiting a doctor's office or shopping?: No  FALL RISK:    07/02/2022    8:14 AM 06/30/2022    7:54 AM 12/30/2021    8:09 AM 05/28/2021   10:07 AM 05/23/2020    1:07 PM  [place] in the past year? 0 1 0 0 0  Number falls in past yr: 0 0 0 0   Injury with Fall? 0 1 0 0   Risk for fall due to : No Fall Risks  No Fall Risks  Medication side effect  Follow up Falls evaluation completed  Falls evaluation completed Falls evaluation completed;Falls prevention discussed Falls evaluation completed;Education provided;Falls prevention discussed    Depression Screen    07/02/2022    8:15 AM 12/30/2021    8:10 AM 06/01/2021    8:14 AM 05/28/2021   10:19 AM 05/23/2020    1:08 PM  Depression screen PHQ 2/9  Decreased Interest 0 0 0 0 0  Down, Depressed, Hopeless 0 0 0 0 0  PHQ - 2 Score 0 0 0 0 0  Altered sleeping 1 0 1    Tired, decreased energy 0 0 0    Change in appetite 0 0 0    Feeling bad or failure about yourself  0 0 0    Trouble concentrating 0 0 0    Moving slowly or fidgety/restless 0 0 0    Suicidal thoughts 0 0 0    PHQ-9 Score 1 0 1    Difficult doing work/chores Not difficult at all Not difficult at all       Advanced Directives Does patient have a HCPOA?    yes If yes, name and contact information: His Wife Does patient have a living will or MOST form?  yes  Past Medical History:  Past Medical History:  Diagnosis Date   Advanced care planning/counseling discussion 07/02/2022  Allergy Childhood   Asthma    COPD (chronic obstructive pulmonary disease) (HCC)    COPD (chronic obstructive pulmonary disease) (HCC)    GERD (gastroesophageal reflux disease)    Hyperlipidemia    Hypertension     Surgical History:  Past Surgical History:  Procedure Laterality Date   carotid surgery  2009   CATARACT EXTRACTION Bilateral 09/2021   EYE SURGERY  6 years ago   Blepharoplasty    Medications:  Current Outpatient Medications on File Prior to Visit  Medication Sig   aspirin EC 81 MG tablet Take 81 mg by mouth daily. Swallow whole.   atorvastatin (LIPITOR) 40 MG tablet Take 1 tablet (40 mg total) by mouth daily.   omeprazole (PRILOSEC) 40  MG capsule TAKE 1 CAPSULE BY MOUTH  DAILY   tadalafil (CIALIS) 20 MG tablet Take 1 tablet (20 mg total) by mouth daily as needed for erectile dysfunction.   albuterol (VENTOLIN HFA) 108 (90 Base) MCG/ACT inhaler Inhale 2 puffs into the lungs every 6 (six) hours as needed for wheezing or shortness of breath. (Patient not taking: Reported on 07/02/2022)   No current facility-administered medications on file prior to visit.    Allergies:  No Known Allergies  Social History:  Social History   Socioeconomic History   Marital status: Married    Spouse name: Not on file   Number of children: Not on file   Years of education: Not on file   Highest education level: Not on file  Occupational History   Not on file  Tobacco Use   Smoking status: Former   Smokeless tobacco: Never  Vaping Use   Vaping Use: Never used  Substance and Sexual Activity   Alcohol use: Not Currently    Alcohol/week: 14.0 standard drinks of alcohol    Types: 14 Cans of beer per week   Drug use: Never   Sexual activity: Yes  Other Topics Concern   Not on file  Social History Narrative   Not on file   Social Determinants of Health   Financial Resource Strain: Low Risk  (05/28/2021)   Overall Financial Resource Strain (CARDIA)    Difficulty of Paying Living Expenses: Not hard at all  Food Insecurity: No Food Insecurity (05/28/2021)   Hunger Vital Sign    Worried About [place] in the Last Year: Never true    [place] in the Last Year: Never true  Transportation Needs: No Transportation Needs (05/28/2021)   PRAPARE - Hydrologist (Medical): No    Lack of Transportation (Non-Medical): No  Physical Activity: Inactive (05/28/2021)   Exercise Vital Sign    Days of Exercise per Week: 0 days    Minutes of Exercise per Session: 0 min  Stress: No Stress Concern Present (05/28/2021)   [place]    Feeling  of Stress : Not at all  Social Connections: Moderately Isolated (05/28/2021)   Social Connection and Isolation Panel [NHANES]    Frequency of Communication with Friends and Family: More than three times a week    Frequency of Social Gatherings with Friends and Family: Twice a week    Attends Religious Services: Never    Marine scientist or Organizations: No    Attends Archivist Meetings: Never    Marital Status: Married  Human resources officer Violence: Not At Risk (05/28/2021)   Humiliation, Afraid, Rape, and Kick questionnaire  Fear of Current or Ex-Partner: No    Emotionally Abused: No    Physically Abused: No    Sexually Abused: No   Social History   Tobacco Use  Smoking Status Former  Smokeless Tobacco Never   Social History   Substance and Sexual Activity  Alcohol Use Not Currently   Alcohol/week: 14.0 standard drinks of alcohol   Types: 14 Cans of beer per week    Family History:  Family History  Problem Relation Age of Onset   COPD Mother    Cancer Father    Heart disease Maternal Grandmother    Stroke Maternal Grandfather    Hyperkalemia Maternal Grandfather     Past medical history, surgical history, medications, allergies, family history and social history reviewed with patient today and changes made to appropriate areas of the chart.   Review of Systems  Eyes:  Negative for blurred vision and double vision.  Respiratory:  Negative for shortness of breath.   Cardiovascular:  Negative for chest pain, palpitations and leg swelling.  Neurological:  Negative for dizziness and headaches.   All other ROS negative except what is listed above and in the HPI.      Objective:    BP 137/71   Pulse 85   Temp 98.1 F (36.7 C) (Oral)   Wt 217 lb 14.4 oz (98.8 kg)   SpO2 96%   BMI 31.27 kg/m   Wt Readings from Last 3 Encounters:  07/02/22 217 lb 14.4 oz (98.8 kg)  01/15/22 217 lb 9.6 oz (98.7 kg)  12/30/21 221 lb (100.2 kg)    No results  found.  Physical Exam Vitals and nursing note reviewed.  Constitutional:      General: He is not in acute distress.    Appearance: Normal appearance. He is not ill-appearing, toxic-appearing or diaphoretic.  HENT:     Head: Normocephalic.     Right Ear: External ear normal.     Left Ear: External ear normal.     Nose: Nose normal. No congestion or rhinorrhea.     Mouth/Throat:     Mouth: Mucous membranes are moist.  Eyes:     General:        Right eye: No discharge.        Left eye: No discharge.     Extraocular Movements: Extraocular movements intact.     Conjunctiva/sclera: Conjunctivae normal.     Pupils: Pupils are equal, round, and reactive to light.  Cardiovascular:     Rate and Rhythm: Normal rate and regular rhythm.     Heart sounds: No murmur heard. Pulmonary:     Effort: Pulmonary effort is normal. No respiratory distress.     Breath sounds: Normal breath sounds. No wheezing, rhonchi or rales.  Abdominal:     General: Abdomen is flat. Bowel sounds are normal.  Musculoskeletal:     Cervical back: Normal range of motion and neck supple.  Skin:    General: Skin is warm and dry.     Capillary Refill: Capillary refill takes less than 2 seconds.  Neurological:     General: No focal deficit present.     Mental Status: He is alert and oriented to person, place, and time.  Psychiatric:        Mood and Affect: Mood normal.        Behavior: Behavior normal.        Thought Content: Thought content normal.        Judgment: Judgment normal.  05/23/2020    1:09 PM  6CIT Screen  What Year? 0 points  What month? 0 points  What time? 0 points  Count back from 20 0 points  Months in reverse 0 points  Repeat phrase 2 points  Total Score 2 points    Cognitive Testing - 6-CIT  Correct? Score   What year is it? yes 0 Yes = 0    No = 4  What month is it? yes 0 Yes = 0    No = 3  Remember:     [address]ia [address], [place], [place]     What time is it? yes 0 Yes = 0     No = 3  Count backwards from 20 to 1 yes 0 Correct = 0    1 error = 2   More than 1 error = 4  Say the months of the year in reverse. yes 0 Correct = 0    1 error = 2   More than 1 error = 4  What address did I ask you to remember? yes 0 Correct = 0  1 error = 2    2 error = 4    3 error = 6    4 error = 8    All wrong = 10       TOTAL SCORE  0/28   Interpretation:  Normal  Normal (0-7) Abnormal (8-28)    Results for orders placed or performed in visit on 12/30/21  TSH  Result Value Ref Range   TSH 3.320 0.450 - 4.500 uIU/mL  PSA  Result Value Ref Range   Prostate Specific Ag, Serum 0.9 0.0 - 4.0 ng/mL  Lipid panel  Result Value Ref Range   Cholesterol, Total 222 (H) 100 - 199 mg/dL   Triglycerides 96 0 - 149 mg/dL   HDL 114 >39 mg/dL   VLDL Cholesterol Cal 16 5 - 40 mg/dL   LDL Chol Calc (NIH) 92 0 - 99 mg/dL   Chol/HDL Ratio 1.9 0.0 - 5.0 ratio  CBC with Differential/Platelet  Result Value Ref Range   WBC 6.4 3.4 - 10.8 x10E3/uL   RBC 4.57 4.14 - 5.80 x10E6/uL   Hemoglobin 14.3 13.0 - 17.7 g/dL   Hematocrit 42.0 37.5 - 51.0 %   MCV 92 79 - 97 fL   MCH 31.3 26.6 - 33.0 pg   MCHC 34.0 31.5 - 35.7 g/dL   RDW 12.8 11.6 - 15.4 %   Platelets 234 150 - 450 x10E3/uL   Neutrophils 60 Not Estab. %   Lymphs 27 Not Estab. %   Monocytes 7 Not Estab. %   Eos 4 Not Estab. %   Basos 1 Not Estab. %   Neutrophils Absolute 3.9 1.4 - 7.0 x10E3/uL   Lymphocytes Absolute 1.7 0.7 - 3.1 x10E3/uL   Monocytes Absolute 0.5 0.1 - 0.9 x10E3/uL   EOS (ABSOLUTE) 0.2 0.0 - 0.4 x10E3/uL   Basophils Absolute 0.0 0.0 - 0.2 x10E3/uL   Immature Granulocytes 1 Not Estab. %   Immature Grans (Abs) 0.1 0.0 - 0.1 x10E3/uL  Comprehensive metabolic panel  Result Value Ref Range   Glucose 103 (H) 70 - 99 mg/dL   BUN 14 8 - 27 mg/dL   Creatinine, Ser 0.69 (L) 0.76 - 1.27 mg/dL   eGFR 101 >59 mL/min/1.73   BUN/Creatinine Ratio 20 10 - 24   Sodium 134 134 - 144 mmol/L   Potassium 4.8 3.5 - 5.2 mmol/L  Chloride 93 (L) 96 - 106 mmol/L   CO2 20 20 - 29 mmol/L   Calcium 10.1 8.6 - 10.2 mg/dL   Total Protein 7.1 6.0 - 8.5 g/dL   Albumin 4.8 3.9 - 4.9 g/dL   Globulin, Total 2.3 1.5 - 4.5 g/dL   Albumin/Globulin Ratio 2.1 1.2 - 2.2   Bilirubin Total 0.5 0.0 - 1.2 mg/dL   Alkaline Phosphatase 110 44 - 121 IU/L   AST 36 0 - 40 IU/L   ALT 48 (H) 0 - 44 IU/L  Urinalysis, Routine w reflex microscopic  Result Value Ref Range   Specific Gravity, UA 1.020 1.005 - 1.030   pH, UA 7.0 5.0 - 7.5   Color, UA Yellow Yellow   Appearance Ur Clear Clear   Leukocytes,UA Negative Negative   Protein,UA Negative Negative/Trace   Glucose, UA Negative Negative   Ketones, UA Negative Negative   RBC, UA Negative Negative   Bilirubin, UA Negative Negative   Urobilinogen, Ur 1.0 0.2 - 1.0 mg/dL   Nitrite, UA Negative Negative      Assessment & Plan:   Problem List Items Addressed This Visit       Cardiovascular and Mediastinum   Essential hypertension    Chronic.  Controlled. Consistently within range at home.   Continue with current medication regimen HCTZ, Amlodipine, Lisinopril, and Metoprolol.  Refills sent today.  Labs ordered today.  Return to clinic in 6 months for reevaluation.  Call sooner if concerns arise.         Relevant Medications   hydrochlorothiazide (HYDRODIURIL) 12.5 MG tablet   lisinopril (ZESTRIL) 20 MG tablet   metoprolol succinate (TOPROL-XL) 50 MG 24 hr tablet   amLODipine (NORVASC) 10 MG tablet   Other Relevant Orders   Comp Met (CMET)   AAA (abdominal aortic aneurysm) without rupture (HCC)    Has history of AAA.  Followed by Cardiology.  Continue to follow their recommendations.       Relevant Medications   hydrochlorothiazide (HYDRODIURIL) 12.5 MG tablet   lisinopril (ZESTRIL) 20 MG tablet   metoprolol succinate (TOPROL-XL) 50 MG 24 hr tablet   amLODipine (NORVASC) 10 MG tablet   Carotid artery stenosis    Followed by Cardiology. Continue to follow their  recommendations.      Relevant Medications   hydrochlorothiazide (HYDRODIURIL) 12.5 MG tablet   lisinopril (ZESTRIL) 20 MG tablet   metoprolol succinate (TOPROL-XL) 50 MG 24 hr tablet   amLODipine (NORVASC) 10 MG tablet     Respiratory   COPD (chronic obstructive pulmonary disease) (HCC)    Chronic.  Controlled.  Continue with current medication regimen wixela.  Has 30 pack history of smoking.   Labs ordered today.  Return to clinic in 6 months for reevaluation.  Call sooner if concerns arise.        Relevant Medications   fluticasone-salmeterol (WIXELA INHUB) 500-50 MCG/ACT AEPB     Other   Hyperlipidemia    Chronic. Controlled.  Cardiology increased Atorvastatin to '40mg'$ .  LFT's checked at visit today.  Follow up in 6 months.  Call sooner if concerns arise.       Relevant Medications   hydrochlorothiazide (HYDRODIURIL) 12.5 MG tablet   lisinopril (ZESTRIL) 20 MG tablet   metoprolol succinate (TOPROL-XL) 50 MG 24 hr tablet   amLODipine (NORVASC) 10 MG tablet   Other Relevant Orders   Lipid Profile   Hepatic function panel   Advanced care planning/counseling discussion    A voluntary discussion about  advance care planning including the explanation and discussion of advance directives was extensively discussed  with the patient for 10 minutes with patient.  Explanation about the health care proxy and Living will was reviewed and packet with forms with explanation of how to fill them out was given.  During this discussion, the patient was able to identify a health care proxy as his wife and does not wish to make changes to paperwork.       Other Visit Diagnoses     Encounter for annual wellness exam in Medicare patient    -  Primary   Elevated glucose       Relevant Orders   HgB A1c        Preventative Services:  Health Risk Assessment and Personalized Prevention Plan: Bone Mass Measurements: CVD Screening:  Colon Cancer Screening:  Depression Screening:  Diabetes  Screening:  Glaucoma Screening:  Hepatitis B vaccine: Hepatitis C screening:  HIV Screening: Flu Vaccine: Lung cancer Screening: Obesity Screening:  Pneumonia Vaccines (2): STI Screening: PSA screening:  Discussed aspirin prophylaxis for myocardial infarction prevention and decision was it was not indicated  LABORATORY TESTING:  Health maintenance labs ordered today as discussed above.   The natural history of prostate cancer and ongoing controversy regarding screening and potential treatment outcomes of prostate cancer has been discussed with the patient. The meaning of a false positive PSA and a false negative PSA has been discussed. He indicates understanding of the limitations of this screening test and wishes to proceed with screening PSA testing.   IMMUNIZATIONS:   - Tdap: Tetanus vaccination status reviewed: last tetanus booster within 10 years. - Influenza: Up to date - Pneumovax: Up to date - Prevnar: Up to date - Zostavax vaccine: Up to date  SCREENING: - Colonoscopy: Up to date  Discussed with patient purpose of the colonoscopy is to detect colon cancer at curable precancerous or early stages   - AAA Screening: Up to date  -Hearing Test: Not applicable  -Spirometry: Not applicable   PATIENT COUNSELING:    Sexuality: Discussed sexually transmitted diseases, partner selection, use of condoms, avoidance of unintended pregnancy  and contraceptive alternatives.   Advised to avoid cigarette smoking.  I discussed with the patient that most people either abstain from alcohol or drink within safe limits (<=14/week and <=4 drinks/occasion for males, <=7/weeks and <= 3 drinks/occasion for females) and that the risk for alcohol disorders and other health effects rises proportionally with the number of drinks per week and how often a drinker exceeds daily limits.  Discussed cessation/primary prevention of drug use and availability of treatment for abuse.   Diet: Encouraged  to adjust caloric intake to maintain  or achieve ideal body weight, to reduce intake of dietary saturated fat and total fat, to limit sodium intake by avoiding high sodium foods and not adding table salt, and to maintain adequate dietary potassium and calcium preferably from fresh fruits, vegetables, and low-fat dairy products.    stressed the importance of regular exercise  Injury prevention: Discussed safety belts, safety helmets, smoke detector, smoking near bedding or upholstery.   Dental health: Discussed importance of regular tooth brushing, flossing, and dental visits.   Follow up plan: NEXT PREVENTATIVE PHYSICAL DUE IN 1 YEAR. Return in about 6 months (around 12/31/2022) for Physical and Fasting labs.

## 2022-07-02 ENCOUNTER — Encounter: Payer: Self-pay | Admitting: Nurse Practitioner

## 2022-07-02 ENCOUNTER — Ambulatory Visit (INDEPENDENT_AMBULATORY_CARE_PROVIDER_SITE_OTHER): Payer: Medicare Other | Admitting: Nurse Practitioner

## 2022-07-02 VITALS — BP 137/71 | HR 85 | Temp 98.1°F | Wt 217.9 lb

## 2022-07-02 DIAGNOSIS — J441 Chronic obstructive pulmonary disease with (acute) exacerbation: Secondary | ICD-10-CM

## 2022-07-02 DIAGNOSIS — R7309 Other abnormal glucose: Secondary | ICD-10-CM

## 2022-07-02 DIAGNOSIS — Z0000 Encounter for general adult medical examination without abnormal findings: Secondary | ICD-10-CM

## 2022-07-02 DIAGNOSIS — I714 Abdominal aortic aneurysm, without rupture, unspecified: Secondary | ICD-10-CM | POA: Diagnosis not present

## 2022-07-02 DIAGNOSIS — I6521 Occlusion and stenosis of right carotid artery: Secondary | ICD-10-CM

## 2022-07-02 DIAGNOSIS — I10 Essential (primary) hypertension: Secondary | ICD-10-CM

## 2022-07-02 DIAGNOSIS — E782 Mixed hyperlipidemia: Secondary | ICD-10-CM

## 2022-07-02 DIAGNOSIS — Z7189 Other specified counseling: Secondary | ICD-10-CM

## 2022-07-02 HISTORY — DX: Other specified counseling: Z71.89

## 2022-07-02 MED ORDER — FLUTICASONE-SALMETEROL 500-50 MCG/ACT IN AEPB: INHALATION_SPRAY | RESPIRATORY_TRACT | 3 refills | Status: DC

## 2022-07-02 MED ORDER — AMLODIPINE BESYLATE 10 MG PO TABS: 10.0000 mg | ORAL_TABLET | Freq: Every day | ORAL | 1 refills | Status: DC

## 2022-07-02 MED ORDER — METOPROLOL SUCCINATE ER 50 MG PO TB24: ORAL_TABLET | ORAL | 1 refills | Status: DC

## 2022-07-02 MED ORDER — HYDROCHLOROTHIAZIDE 12.5 MG PO TABS: 12.5000 mg | ORAL_TABLET | Freq: Every day | ORAL | 1 refills | Status: DC

## 2022-07-02 MED ORDER — LISINOPRIL 20 MG PO TABS: 20.0000 mg | ORAL_TABLET | Freq: Every day | ORAL | 1 refills | Status: DC

## 2022-07-02 NOTE — Assessment & Plan Note (Signed)
Chronic.  Controlled. Consistently within range at home.   Continue with current medication regimen HCTZ, Amlodipine, Lisinopril, and Metoprolol.  Refills sent today.  Labs ordered today.  Return to clinic in 6 months for reevaluation.  Call sooner if concerns arise.

## 2022-07-02 NOTE — Assessment & Plan Note (Signed)
Has history of AAA.  Followed by Cardiology.  Continue to follow their recommendations.

## 2022-07-02 NOTE — Assessment & Plan Note (Signed)
A voluntary discussion about advance care planning including the explanation and discussion of advance directives was extensively discussed  with the patient for 10 minutes with patient.  Explanation about the health care proxy and Living will was reviewed and packet with forms with explanation of how to fill them out was given.  During this discussion, the patient was able to identify a health care proxy as his wife and does not wish to make changes to paperwork.

## 2022-07-02 NOTE — Assessment & Plan Note (Signed)
Chronic. Controlled.  Cardiology increased Atorvastatin to '40mg'$ .  LFT's checked at visit today.  Follow up in 6 months.  Call sooner if concerns arise.

## 2022-07-02 NOTE — Assessment & Plan Note (Signed)
Followed by Cardiology. Continue to follow their recommendations.

## 2022-07-02 NOTE — Assessment & Plan Note (Signed)
Chronic.  Controlled.  Continue with current medication regimen wixela.  Has 30 pack history of smoking.   Labs ordered today.  Return to clinic in 6 months for reevaluation.  Call sooner if concerns arise.

## 2022-07-03 LAB — COMPREHENSIVE METABOLIC PANEL
ALT: 41 IU/L (ref 0–44)
AST: 32 IU/L (ref 0–40)
Albumin/Globulin Ratio: 2 (ref 1.2–2.2)
Albumin: 4.6 g/dL (ref 3.9–4.9)
Alkaline Phosphatase: 110 IU/L (ref 44–121)
BUN/Creatinine Ratio: 20 (ref 10–24)
BUN: 16 mg/dL (ref 8–27)
Bilirubin Total: 0.5 mg/dL (ref 0.0–1.2)
CO2: 23 mmol/L (ref 20–29)
Calcium: 9.8 mg/dL (ref 8.6–10.2)
Chloride: 93 mmol/L — ABNORMAL LOW (ref 96–106)
Creatinine, Ser: 0.8 mg/dL (ref 0.76–1.27)
Globulin, Total: 2.3 g/dL (ref 1.5–4.5)
Glucose: 100 mg/dL — ABNORMAL HIGH (ref 70–99)
Potassium: 4.8 mmol/L (ref 3.5–5.2)
Sodium: 132 mmol/L — ABNORMAL LOW (ref 134–144)
Total Protein: 6.9 g/dL (ref 6.0–8.5)
eGFR: 96 mL/min/{1.73_m2} (ref >59)

## 2022-07-03 LAB — LIPID PANEL
Chol/HDL Ratio: 1.6 ratio (ref 0.0–5.0)
Cholesterol, Total: 188 mg/dL (ref 100–199)
HDL: 114 mg/dL (ref >39)
LDL Chol Calc (NIH): 63 mg/dL (ref 0–99)
Triglycerides: 54 mg/dL (ref 0–149)
VLDL Cholesterol Cal: 11 mg/dL (ref 5–40)

## 2022-07-03 LAB — HEMOGLOBIN A1C
Est. average glucose Bld gHb Est-mCnc: 120 mg/dL
Hgb A1c MFr Bld: 5.8 % — ABNORMAL HIGH (ref 4.8–5.6)

## 2022-07-03 LAB — HEPATIC FUNCTION PANEL: Bilirubin, Direct: 0.16 mg/dL (ref 0.00–0.40)

## 2022-07-05 NOTE — Progress Notes (Signed)
Hi Charles Bryant. It was nice to see you last week.  Your lab work looks good.  A1c is stale at 5.8%.  Your liver looks good.  No concerns at this time. Continue with your current medication regimen.  Follow up as discussed.  Please let me know if you have any questions.

## 2022-07-06 ENCOUNTER — Ambulatory Visit (INDEPENDENT_AMBULATORY_CARE_PROVIDER_SITE_OTHER): Payer: Medicare Other

## 2022-07-06 VITALS — Ht 70.0 in | Wt 217.0 lb

## 2022-07-06 DIAGNOSIS — Z0000 Encounter for general adult medical examination without abnormal findings: Secondary | ICD-10-CM | POA: Diagnosis not present

## 2022-07-06 NOTE — Patient Instructions (Signed)
Charles Bryant , Thank you for taking time to come for your Medicare Wellness Visit. I appreciate your ongoing commitment to your health goals. Please review the following plan we discussed and let me know if I can assist you in the future.   These are the goals we discussed:  Goals      DIET - EAT MORE FRUITS AND VEGETABLES     Patient Stated     05/23/2020, return to exercise     Patient Stated     Start exercising again        This is a list of the screening recommended for you and due dates:  Health Maintenance  Topic Date Due   DTaP/Tdap/Td vaccine (1 - Tdap) Never done   Screening for Lung Cancer  Never done   COVID-19 Vaccine (6 - 2023-24 season) 01/08/2022   Medicare Annual Wellness Visit  07/07/2023   Cologuard (Stool DNA test)  09/24/2023   Pneumonia Vaccine  Completed   Flu Shot  Completed   Hepatitis C Screening: USPSTF Recommendation to screen - Ages 18-79 yo.  Completed   Zoster (Shingles) Vaccine  Completed   HPV Vaccine  Aged Out    Advanced directives: no  Conditions/risks identified: none  Next appointment: Follow up in one year for your annual wellness visit. 07/11/23 @ 3:30 by phone  Preventive Care 65 Years and Older, Male  Preventive care refers to lifestyle choices and visits with your health care provider that can promote health and wellness. What does preventive care include? A yearly physical exam. This is also called an annual well check. Dental exams once or twice a year. Routine eye exams. Ask your health care provider how often you should have your eyes checked. Personal lifestyle choices, including: Daily care of your teeth and gums. Regular physical activity. Eating a healthy diet. Avoiding tobacco and drug use. Limiting alcohol use. Practicing safe sex. Taking low doses of aspirin every day. Taking vitamin and mineral supplements as recommended by your health care provider. What happens during an annual well check? The services and  screenings done by your health care provider during your annual well check will depend on your age, overall health, lifestyle risk factors, and family history of disease. Counseling  Your health care provider may ask you questions about your: Alcohol use. Tobacco use. Drug use. Emotional well-being. Home and relationship well-being. Sexual activity. Eating habits. History of falls. Memory and ability to understand (cognition). Work and work Statistician. Screening  You may have the following tests or measurements: Height, weight, and BMI. Blood pressure. Lipid and cholesterol levels. These may be checked every 5 years, or more frequently if you are over 2 years old. Skin check. Lung cancer screening. You may have this screening every year starting at age 23 if you have a 30-pack-year history of smoking and currently smoke or have quit within the past 15 years. Fecal occult blood test (FOBT) of the stool. You may have this test every year starting at age 16. Flexible sigmoidoscopy or colonoscopy. You may have a sigmoidoscopy every 5 years or a colonoscopy every 10 years starting at age 107. Prostate cancer screening. Recommendations will vary depending on your family history and other risks. Hepatitis C blood test. Hepatitis B blood test. Sexually transmitted disease (STD) testing. Diabetes screening. This is done by checking your blood sugar (glucose) after you have not eaten for a while (fasting). You may have this done every 1-3 years. Abdominal aortic aneurysm (AAA) screening. You  may need this if you are a current or former smoker. Osteoporosis. You may be screened starting at age 45 if you are at high risk. Talk with your health care provider about your test results, treatment options, and if necessary, the need for more tests. Vaccines  Your health care provider may recommend certain vaccines, such as: Influenza vaccine. This is recommended every year. Tetanus, diphtheria, and  acellular pertussis (Tdap, Td) vaccine. You may need a Td booster every 10 years. Zoster vaccine. You may need this after age 74. Pneumococcal 13-valent conjugate (PCV13) vaccine. One dose is recommended after age 16. Pneumococcal polysaccharide (PPSV23) vaccine. One dose is recommended after age 65. Talk to your health care provider about which screenings and vaccines you need and how often you need them. This information is not intended to replace advice given to you by your health care provider. Make sure you discuss any questions you have with your health care provider. Document Released: 05/23/2015 Document Revised: 01/14/2016 Document Reviewed: 02/25/2015 Elsevier Interactive Patient Education  2017 [place] Prevention in the Home Falls can cause injuries. They can happen to people of all ages. There are many things you can do to make your home safe and to help prevent falls. What can I do on the outside of my home? Regularly fix the edges of walkways and driveways and fix any cracks. Remove anything that might make you trip as you walk through a door, such as a raised step or threshold. Trim any bushes or trees on the path to your home. Use bright outdoor lighting. Clear any walking paths of anything that might make someone trip, such as rocks or tools. Regularly check to see if handrails are loose or broken. Make sure that both sides of any steps have handrails. Any raised decks and porches should have guardrails on the edges. Have any leaves, snow, or ice cleared regularly. Use sand or salt on walking paths during winter. Clean up any spills in your garage right away. This includes oil or grease spills. What can I do in the bathroom? Use night lights. Install grab bars by the toilet and in the tub and shower. Do not use towel bars as grab bars. Use non-skid mats or decals in the tub or shower. If you need to sit down in the shower, use a plastic, non-slip stool. Keep  the floor dry. Clean up any water that spills on the floor as soon as it happens. Remove soap buildup in the tub or shower regularly. Attach bath mats securely with double-sided non-slip rug tape. Do not have throw rugs and other things on the floor that can make you trip. What can I do in the bedroom? Use night lights. Make sure that you have a light by your bed that is easy to reach. Do not use any sheets or blankets that are too big for your bed. They should not hang down onto the floor. Have a firm chair that has side arms. You can use this for support while you get dressed. Do not have throw rugs and other things on the floor that can make you trip. What can I do in the kitchen? Clean up any spills right away. Avoid walking on wet floors. Keep items that you use a lot in easy-to-reach places. If you need to reach something above you, use a strong step stool that has a grab bar. Keep electrical cords out of the way. Do not use floor polish or wax that  makes floors slippery. If you must use wax, use non-skid floor wax. Do not have throw rugs and other things on the floor that can make you trip. What can I do with my stairs? Do not leave any items on the stairs. Make sure that there are handrails on both sides of the stairs and use them. Fix handrails that are broken or loose. Make sure that handrails are as long as the stairways. Check any carpeting to make sure that it is firmly attached to the stairs. Fix any carpet that is loose or worn. Avoid having throw rugs at the top or bottom of the stairs. If you do have throw rugs, attach them to the floor with carpet tape. Make sure that you have a light switch at the top of the stairs and the bottom of the stairs. If you do not have them, ask someone to add them for you. What else can I do to help prevent falls? Wear shoes that: Do not have high heels. Have rubber bottoms. Are comfortable and fit you well. Are closed at the toe. Do not  wear sandals. If you use a stepladder: Make sure that it is fully opened. Do not climb a closed stepladder. Make sure that both sides of the stepladder are locked into place. Ask someone to hold it for you, if possible. Clearly [place] and make sure that you can see: Any grab bars or handrails. First and last steps. Where the edge of each step is. Use tools that help you move around (mobility aids) if they are needed. These include: Canes. Walkers. Scooters. Crutches. Turn on the lights when you go into a dark area. Replace any light bulbs as soon as they burn out. Set up your furniture so you have a clear path. Avoid moving your furniture around. If any of your floors are uneven, fix them. If there are any pets around you, be aware of where they are. Review your medicines with your doctor. Some medicines can make you feel dizzy. This can increase your chance of falling. Ask your doctor what other things that you can do to help prevent falls. This information is not intended to replace advice given to you by your health care provider. Make sure you discuss any questions you have with your health care provider. Document Released: 02/20/2009 Document Revised: 10/02/2015 Document Reviewed: 05/31/2014 Elsevier Interactive Patient Education  2017 [employer].

## 2022-07-06 NOTE — Progress Notes (Signed)
I connected with  Sonny Dandy on 07/06/22 by a audio enabled telemedicine application and verified that I am speaking with the correct person using two identifiers.  Patient Location: Home  Provider Location: Office/Clinic  I discussed the limitations of evaluation and management by telemedicine. The patient expressed understanding and agreed to proceed.  Subjective:   Maximillion Aveni is a 67 y.o. male who presents for Medicare Annual/Subsequent preventive examination.  Review of Systems     Cardiac Risk Factors include: advanced age (>44mn, >>60women);hypertension;male gender     Objective:    There were no vitals filed for this visit. There is no height or weight on file to calculate BMI.     07/06/2022    8:49 AM 05/28/2021    9:49 AM 05/23/2020    1:07 PM  Advanced Directives  Does Patient Have a Medical Advance Directive? No Yes Yes  Type of ACorporate treasurerof AHob[place]Living will  Copy of [place] Chart?  No - copy requested No - copy requested  Would patient like information on creating a medical advance directive? No - Patient declined      Current Medications (verified) Outpatient Encounter Medications as of 07/06/2022  Medication Sig   albuterol (VENTOLIN HFA) 108 (90 Base) MCG/ACT inhaler Inhale 2 puffs into the lungs every 6 (six) hours as needed for wheezing or shortness of breath.   amLODipine (NORVASC) 10 MG tablet Take 1 tablet (10 mg total) by mouth daily.   aspirin EC 81 MG tablet Take 81 mg by mouth daily. Swallow whole.   atorvastatin (LIPITOR) 40 MG tablet Take 1 tablet (40 mg total) by mouth daily.   fluticasone-salmeterol (WIXELA INHUB) 500-50 MCG/ACT AEPB USE 1 INHALATION BY MOUTH  TWICE DAILY   hydrochlorothiazide (HYDRODIURIL) 12.5 MG tablet Take 1 tablet (12.5 mg total) by mouth daily.   lisinopril (ZESTRIL) 20 MG tablet Take 1 tablet (20 mg total) by mouth daily.   metoprolol succinate  (TOPROL-XL) 50 MG 24 hr tablet Take with or immediately following a meal.   omeprazole (PRILOSEC) 40 MG capsule TAKE 1 CAPSULE BY MOUTH  DAILY   tadalafil (CIALIS) 20 MG tablet Take 1 tablet (20 mg total) by mouth daily as needed for erectile dysfunction.   No facility-administered encounter medications on file as of 07/06/2022.    Allergies (verified) Patient has no known allergies.   History: Past Medical History:  Diagnosis Date   Advanced care planning/counseling discussion 07/02/2022   Allergy Childhood   Asthma    COPD (chronic obstructive pulmonary disease) (HCC)    COPD (chronic obstructive pulmonary disease) (HCC)    GERD (gastroesophageal reflux disease)    Hyperlipidemia    Hypertension    Past Surgical History:  Procedure Laterality Date   carotid surgery  2009   CATARACT EXTRACTION Bilateral 09/2021   EYE SURGERY  6 years ago   Blepharoplasty   Family History  Problem Relation Age of Onset   COPD Mother    Cancer Father    Heart disease Maternal Grandmother    Stroke Maternal Grandfather    Hyperkalemia Maternal Grandfather    Social History   Socioeconomic History   Marital status: Married    Spouse name: Not on file   Number of children: Not on file   Years of education: Not on file   Highest education level: Not on file  Occupational History   Not on file  Tobacco Use  Smoking status: Former   Smokeless tobacco: Never  Scientific laboratory technician Use: Never used  Substance and Sexual Activity   Alcohol use: Not Currently    Alcohol/week: 14.0 standard drinks of alcohol    Types: 14 Cans of beer per week   Drug use: Never   Sexual activity: Yes  Other Topics Concern   Not on file  Social History Narrative   Not on file   Social Determinants of Health   Financial Resource Strain: Low Risk  (07/06/2022)   Overall Financial Resource Strain (CARDIA)    Difficulty of Paying Living Expenses: Not hard at all  Food Insecurity: No Food Insecurity  (07/06/2022)   Hunger Vital Sign    Worried About Running Out of Food in the Last Year: Never true    Ran Out of Food in the Last Year: Never true  Transportation Needs: No Transportation Needs (07/06/2022)   PRAPARE - Hydrologist (Medical): No    Lack of Transportation (Non-Medical): No  Physical Activity: Inactive (07/06/2022)   Exercise Vital Sign    Days of Exercise per Week: 0 days    Minutes of Exercise per Session: 0 min  Stress: No Stress Concern Present (07/06/2022)   [place]    Feeling of Stress : Not at all  Social Connections: Socially Isolated (07/06/2022)   Social Connection and Isolation Panel [NHANES]    Frequency of Communication with Friends and Family: Twice a week    Frequency of Social Gatherings with Friends and Family: Never    Attends Religious Services: Never    Marine scientist or Organizations: No    Attends Music therapist: Never    Marital Status: Married    Tobacco Counseling Counseling given: Not Answered   Clinical Intake:  Pre-visit preparation completed: Yes  Pain : No/denies pain     Nutritional Risks: None Diabetes: No  How often do you need to have someone help you when you read instructions, pamphlets, or other written materials from your doctor or pharmacy?: 1 - Never  Diabetic?no  Interpreter Needed?: No  Information entered by :: Kirke Shaggy, LPN   Activities of Daily Living    07/06/2022    8:53 AM 07/02/2022    8:12 AM  In your present state of health, do you have any difficulty performing the following activities:  Hearing? 0 0  Vision? 0 0  Difficulty concentrating or making decisions? 0 0  Walking or climbing stairs? 0 0  Dressing or bathing? 0 0  Doing errands, shopping? 0 0  Preparing Food and eating ? N   Using the Toilet? N   In the past six months, have you accidently leaked urine? N   Do you  have problems with loss of bowel control? N   Managing your Medications? N   Managing your Finances? N   Housekeeping or managing your Housekeeping? N     Patient Care Team: Jon Billings, NP as PCP - General  Indicate any recent Medical Services you may have received from other than [facility] providers in the past year (date may be approximate).     Assessment:   This is a routine wellness examination for [employer].  Hearing/Vision screen Hearing Screening - Comments:: No aids Vision Screening - Comments:: Readers- Dr.Ritter  Dietary issues and exercise activities discussed: Current Exercise Habits: The patient does not participate in regular exercise at present  Goals Addressed             This Visit's Progress    DIET - EAT MORE FRUITS AND VEGETABLES         Depression Screen    07/06/2022    8:47 AM 07/02/2022    8:15 AM 12/30/2021    8:10 AM 06/01/2021    8:14 AM 05/28/2021   10:19 AM 05/23/2020    1:08 PM 12/18/2019    9:42 AM  PHQ 2/9 Scores  PHQ - 2 Score 0 0 0 0 0 0 0  PHQ- 9 Score 0 1 0 1   2    Fall Risk    07/06/2022    8:51 AM 07/02/2022    8:14 AM 06/30/2022    7:54 AM 12/30/2021    8:09 AM 05/28/2021   10:07 AM  Fall Risk   Falls in the past year? 1 0 1 0 0  Number falls in past yr: 0 0 0 0 0  Injury with Fall? 0 0 1 0 0  Risk for fall due to : History of fall(s) No Fall Risks  No Fall Risks   Follow up Falls evaluation completed;Falls prevention discussed Falls evaluation completed  Falls evaluation completed Falls evaluation completed;Falls prevention discussed    FALL RISK PREVENTION PERTAINING TO THE HOME:  Any stairs in or around the home? Yes  If so, are there any without handrails? No  Home free of loose throw rugs in walkways, pet beds, electrical cords, etc? Yes  Adequate lighting in your home to reduce risk of falls? Yes   ASSISTIVE DEVICES UTILIZED TO PREVENT FALLS:  Life alert? No  Use of a cane, walker or w/c? No  Grab bars in the  bathroom? Yes  Shower chair or bench in shower? Yes  Elevated toilet seat or a handicapped toilet? No    Cognitive Function:        07/06/2022    8:57 AM 05/23/2020    1:09 PM  6CIT Screen  What Year? 0 points 0 points  What month? 0 points 0 points  What time? 0 points 0 points  Count back from 20 0 points 0 points  Months in reverse 0 points 0 points  Repeat phrase 0 points 2 points  Total Score 0 points 2 points    Immunizations Immunization History  Administered Date(s) Administered   Influenza, High Dose Seasonal PF 02/01/2022   Influenza-Unspecified 02/22/2019, 02/04/2020, 01/22/2021   PFIZER(Purple Top)SARS-COV-2 Vaccination 02/04/2020, 08/13/2020, 01/22/2021, 09/30/2021, 09/30/2021   PNEUMOCOCCAL CONJUGATE-20 12/30/2021   Pneumococcal Conjugate-13 12/18/2019   Rsv, Bivalent, Protein Subunit Rsvpref,pf Evans Lance) 04/28/2022   Zoster Recombinat (Shingrix) 07/08/2016, 02/23/2017    TDAP status: Due, Education has been provided regarding the importance of this vaccine. Advised may receive this vaccine at local pharmacy or Health Dept. Aware to provide a copy of the vaccination record if obtained from local pharmacy or Health Dept. Verbalized acceptance and understanding.  Flu Vaccine status: Up to date  Pneumococcal vaccine status: Up to date  Covid-19 vaccine status: Completed vaccines  Qualifies for Shingles Vaccine? Yes   Zostavax completed No   Shingrix Completed?: Yes  Screening Tests Health Maintenance  Topic Date Due   DTaP/Tdap/Td (1 - Tdap) Never done   Lung Cancer Screening  Never done   COVID-19 Vaccine (6 - 2023-24 season) 01/08/2022   Medicare Annual Wellness (AWV)  07/07/2023   Fecal DNA (Cologuard)  09/24/2023   Pneumonia Vaccine 46+ Years old  Completed  INFLUENZA VACCINE  Completed   Hepatitis C Screening  Completed   Zoster Vaccines- Shingrix  Completed   HPV VACCINES  Aged Out    Health Maintenance  Health Maintenance Due  Topic  Date Due   DTaP/Tdap/Td (1 - Tdap) Never done   Lung Cancer Screening  Never done   COVID-19 Vaccine (6 - 2023-24 season) 01/08/2022    Colorectal cancer screening: Type of screening: Cologuard. Completed 09/23/20. Repeat every 3 years  Lung Cancer Screening: (Low Dose CT Chest recommended if Age 33-80 years, 30 pack-year currently smoking OR have quit w/in 15years.) does not qualify.   Ordered on 12/30/21  Additional Screening:  Hepatitis C Screening: does qualify; Completed 12/18/19  Vision Screening: Recommended annual ophthalmology exams for early detection of glaucoma and other disorders of the eye. Is the patient up to date with their annual eye exam?  Yes  Who is the provider or what is the name of the office in which the patient attends annual eye exams? Dr.Ritter If pt is not established with a provider, would they like to be referred to a provider to establish care? No .   Dental Screening: Recommended annual dental exams for proper oral hygiene  Community Resource Referral / Chronic Care Management: CRR required this visit?  No   CCM required this visit?  No      Plan:     I have personally reviewed and noted the following in the patient's chart:   Medical and social history Use of alcohol, tobacco or illicit drugs  Current medications and supplements including opioid prescriptions. Patient is not currently taking opioid prescriptions. Functional ability and status Nutritional status Physical activity Advanced directives List of other physicians Hospitalizations, surgeries, and ER visits in previous 12 months Vitals Screenings to include cognitive, depression, and falls Referrals and appointments  In addition, I have reviewed and discussed with patient certain preventive protocols, quality metrics, and best practice recommendations. A written personalized care plan for preventive services as well as general preventive health recommendations were provided to  patient.     Dionisio David, LPN   [id number]   Nurse Notes: none

## 2022-08-31 ENCOUNTER — Encounter: Payer: Self-pay | Admitting: Nurse Practitioner

## 2022-08-31 DIAGNOSIS — D229 Melanocytic nevi, unspecified: Secondary | ICD-10-CM

## 2022-11-16 ENCOUNTER — Ambulatory Visit
Admission: RE | Admit: 2022-11-16 | Discharge: 2022-11-16 | Disposition: A | Discharge status: Home / Self Care | Payer: Medicare Other | Source: Ambulatory Visit | Attending: Emergency Medicine | Admitting: Emergency Medicine

## 2022-11-16 ENCOUNTER — Ambulatory Visit (INDEPENDENT_AMBULATORY_CARE_PROVIDER_SITE_OTHER): Payer: Medicare Other

## 2022-11-16 VITALS — BP 129/80 | HR 100 | Temp 98.7°F | Resp 18

## 2022-11-16 DIAGNOSIS — M25562 Pain in left knee: Secondary | ICD-10-CM

## 2022-11-16 NOTE — ED Provider Notes (Signed)
Renaldo Fiddler    CSN: [id number] Arrival date & time: 11/16/22  1330      History   Chief Complaint Chief Complaint  Patient presents with   Knee Pain    Onset of pain this week in my left knee without trauma. Has gradually worsened this week until walking without a cane has become difficult. - Entered by patient    HPI Helmuth Rauls is a 67 y.o. male.  Accompanied by his wife, patient presents with left knee pain x 5 days.  No falls or injury.  The pain has progressively gotten worse.  Today he is having difficulty ambulating and weightbearing.  No pain at rest.  No fever, chills, wounds, bruising, redness, numbness, weakness, or other symptoms.  Treating with naproxen and Tylenol.  His medical history includes COPD, hypertension, AAA.  The history is provided by the patient and medical records.    Past Medical History:  Diagnosis Date   Advanced care planning/counseling discussion 07/02/2022   Allergy Childhood   Asthma    COPD (chronic obstructive pulmonary disease) (HCC)    COPD (chronic obstructive pulmonary disease) (HCC)    GERD (gastroesophageal reflux disease)    Hyperlipidemia    Hypertension     Patient Active Problem List   Diagnosis Date Noted   Advanced care planning/counseling discussion 07/02/2022   Tobacco abuse 01/15/2022   AAA (abdominal aortic aneurysm) without rupture (HCC) 12/30/2021   Carotid artery stenosis 12/30/2021   Hyperlipidemia    GERD (gastroesophageal reflux disease)    COPD (chronic obstructive pulmonary disease) (HCC)    Asthma    Essential hypertension     Past Surgical History:  Procedure Laterality Date   carotid surgery  2009   CATARACT EXTRACTION Bilateral 09/2021   EYE SURGERY  6 years ago   Blepharoplasty       Home Medications    Prior to Admission medications   Medication Sig Start Date End Date Taking? Authorizing Provider  albuterol (VENTOLIN HFA) 108 (90 Base) MCG/ACT inhaler Inhale 2 puffs into the lungs  every 6 (six) hours as needed for wheezing or shortness of breath. 03/08/22   Larae Grooms, NP  amLODipine (NORVASC) 10 MG tablet Take 1 tablet (10 mg total) by mouth daily. 07/02/22   Larae Grooms, NP  aspirin EC 81 MG tablet Take 81 mg by mouth daily. Swallow whole.    [provider]  atorvastatin (LIPITOR) 40 MG tablet Take 1 tablet (40 mg total) by mouth daily. 02/24/22   Runell Gess, MD  fluticasone-salmeterol [facility] INHUB) 500-50 MCG/ACT AEPB USE 1 INHALATION BY MOUTH  TWICE DAILY 07/02/22   Larae Grooms, NP  hydrochlorothiazide (HYDRODIURIL) 12.5 MG tablet Take 1 tablet (12.5 mg total) by mouth daily. 07/02/22   Larae Grooms, NP  lisinopril (ZESTRIL) 20 MG tablet Take 1 tablet (20 mg total) by mouth daily. 07/02/22   Larae Grooms, NP  metoprolol succinate (TOPROL-XL) 50 MG 24 hr tablet Take with or immediately following a meal. 07/02/22   Larae Grooms, NP  omeprazole (PRILOSEC) 40 MG capsule TAKE 1 CAPSULE BY MOUTH  DAILY 11/27/21   Larae Grooms, NP  tadalafil (CIALIS) 20 MG tablet Take 1 tablet (20 mg total) by mouth daily as needed for erectile dysfunction. 12/18/19   Particia Nearing, PA-C    Family History Family History  Problem Relation Age of Onset   COPD Mother    Cancer Father    Heart disease Maternal Grandmother    Stroke  Maternal Grandfather    Hyperkalemia Maternal Grandfather     Social History Social History   Tobacco Use   Smoking status: Former   Smokeless tobacco: Never  Building services engineer Use: Never used  Substance Use Topics   Alcohol use: Not Currently    Alcohol/week: 14.0 standard drinks of alcohol    Types: 14 Cans of beer per week   Drug use: Never     Allergies   Patient has no known allergies.   Review of Systems Review of Systems  Constitutional:  Negative for chills and fever.  Musculoskeletal:  Positive for arthralgias, gait problem and joint swelling.  Skin:  Negative for color  change, rash and wound.  Neurological:  Negative for weakness and numbness.  All other systems reviewed and are negative.    Physical Exam Triage Vital Signs ED Triage Vitals [11/16/22 1357]  Enc Vitals Group     BP      Pulse Rate 100     Resp 18     Temp 98.7 F (37.1 C)     Temp src      SpO2 95 %     Weight      Height      Head Circumference      Peak Flow      Pain Score      Pain Loc      Pain Edu?      Excl. in GC?    No data found.  Updated Vital Signs BP 129/80   Pulse 100   Temp 98.7 F (37.1 C)   Resp 18   SpO2 95%   Visual Acuity Right Eye Distance:   Left Eye Distance:   Bilateral Distance:    Right Eye Near:   Left Eye Near:    Bilateral Near:     Physical Exam Constitutional:      General: He is not in acute distress. HENT:     Mouth/Throat:     Mouth: Mucous membranes are moist.  Cardiovascular:     Rate and Rhythm: Normal rate and regular rhythm.  Pulmonary:     Effort: Pulmonary effort is normal. No respiratory distress.  Musculoskeletal:        General: Swelling and tenderness present. No deformity. Normal range of motion.       Legs:  Skin:    Capillary Refill: Capillary refill takes less than 2 seconds.     Findings: No bruising, erythema, lesion or rash.  Neurological:     General: No focal deficit present.     Mental Status: He is alert and oriented to person, place, and time.     Sensory: No sensory deficit.     Motor: No weakness.     Gait: Gait abnormal.  Psychiatric:        Mood and Affect: Mood normal.        Behavior: Behavior normal.      UC Treatments / Results  Labs (all labs ordered are listed, but only abnormal results are displayed) Labs Reviewed - No data to display  EKG   Radiology DG Knee Complete 4 Views Left  Result Date: 11/16/2022 CLINICAL DATA:  pain.  no known injury. EXAM: LEFT KNEE - COMPLETE 4+ VIEW COMPARISON:  None Available. FINDINGS: No acute fracture or dislocation. Joint spaces  and alignment are relatively maintained. No area of erosion or osseous destruction. No unexpected radiopaque foreign body. Vascular calcifications. Trace knee joint effusion. IMPRESSION: 1. No  acute fracture or dislocation. Electronically Signed   By: Meda Klinefelter M.D.   On: 11/16/2022 14:21    Procedures Procedures (including critical care time)  Medications Ordered in UC Medications - No data to display  Initial Impression / Assessment and Plan / UC Course  I have reviewed the triage vital signs and the nursing notes.  Pertinent labs & imaging results that were available during my care of the patient were reviewed by me and considered in my medical decision making (see chart for details).    Left knee pain.  X-ray negative.  Treating with knee sleeve, rest, elevation, ice packs, ibuprofen or Tylenol.  Patient declines crutches.  He has ordered a walker.  Education provided on knee pain.  Instructed patient to follow-up with orthopedics.  Contact information for on-call Ortho provided.  Patient agrees to plan of care.   Final Clinical Impressions(s) / UC Diagnoses   Final diagnoses:  Acute pain of left knee     Discharge Instructions      Take Tylenol or ibuprofen.  Rest and elevate your knee.  Apply ice packs 2-3 times a day for up to 20 minutes each.  Wear the knee sleeve as directed.    Follow up with an orthopedist such as the one listed below.        ED Prescriptions   None    PDMP not reviewed this encounter.   Mickie Bail, NP 11/16/22 1440

## 2022-11-16 NOTE — Discharge Instructions (Addendum)
Take Tylenol or ibuprofen.  Rest and elevate your knee.  Apply ice packs 2-3 times a day for up to 20 minutes each.  Wear the knee sleeve as directed.    Follow up with an orthopedist such as the one listed below.

## 2022-11-16 NOTE — ED Triage Notes (Signed)
Patient to Urgent Care with complaints of left sided knee pain x5 days. Denies any known injury or cause for symptoms.   States pain worsened over night and that it has become difficult to walk even with the use of a cane.   Denies any prior knee injuries/ surgeries.

## 2022-12-07 ENCOUNTER — Other Ambulatory Visit: Payer: Self-pay | Admitting: Cardiovascular Disease

## 2022-12-13 ENCOUNTER — Other Ambulatory Visit: Payer: Self-pay | Admitting: Nurse Practitioner

## 2022-12-14 NOTE — Telephone Encounter (Signed)
Requested Prescriptions  Pending Prescriptions Disp Refills   omeprazole (PRILOSEC) 40 MG capsule [Pharmacy Med Name: Omeprazole 40 MG Oral Capsule Delayed Release] 100 capsule 0    Sig: TAKE 1 CAPSULE BY MOUTH DAILY     Gastroenterology: Proton Pump Inhibitors Passed - 12/13/2022  5:07 AM      Passed - Valid encounter within last 12 months    Recent Outpatient Visits           5 months ago Encounter for annual wellness exam in Medicare patient   [place] [facility] Larae Grooms, NP   11 months ago Annual physical exam   [place] [facility] Larae Grooms, NP   1 year ago Essential hypertension   [place] [facility] Larae Grooms, NP   1 year ago Annual physical exam   [place] [facility] Larae Grooms, NP   2 years ago Essential hypertension   [place] [facility] Larae Grooms, NP       Future Appointments             In 1 month Larae Grooms, NP  [facility]

## 2023-01-05 ENCOUNTER — Other Ambulatory Visit: Payer: Self-pay | Admitting: Nurse Practitioner

## 2023-01-06 NOTE — Telephone Encounter (Signed)
Requested Prescriptions  Pending Prescriptions Disp Refills   hydrochlorothiazide (HYDRODIURIL) 12.5 MG tablet [Pharmacy Med Name: hydroCHLOROthiazide 12.5 MG Oral Tablet] 90 tablet 3    Sig: TAKE 1 TABLET BY MOUTH DAILY     Cardiovascular: Diuretics - Thiazide Failed - 01/05/2023 10:04 PM      Failed - Cr in normal range and within 180 days    Creatinine, Ser  Date Value Ref Range Status  07/02/2022 0.80 0.76 - 1.27 mg/dL Final         Failed - K in normal range and within 180 days    Potassium  Date Value Ref Range Status  07/02/2022 4.8 3.5 - 5.2 mmol/L Final         Failed - Na in normal range and within 180 days    Sodium  Date Value Ref Range Status  07/02/2022 132 (L) 134 - 144 mmol/L Final         Failed - Valid encounter within last 6 months    Recent Outpatient Visits           6 months ago Encounter for annual wellness exam in Medicare patient   [place] [facility] Larae Grooms, NP   1 year ago Annual physical exam   [place] [facility] Larae [facility], NP   1 year ago Essential hypertension   [place] [facility] Grooms, NP   2 years ago Annual physical exam   [place] [facility] Larae Grooms, NP   2 years ago Essential hypertension   [place] [facility] Larae Grooms, NP       Future Appointments             In 1 month Larae Grooms, NP  [facility]            Passed - Last BP in normal range    BP Readings from Last 1 Encounters:  11/16/22 129/80

## 2023-01-14 ENCOUNTER — Other Ambulatory Visit: Payer: Self-pay | Admitting: Nurse Practitioner

## 2023-01-14 NOTE — Telephone Encounter (Signed)
Appointment 02/08/23 Requested Prescriptions  Pending Prescriptions Disp Refills   metoprolol succinate (TOPROL-XL) 50 MG 24 hr tablet [Pharmacy Med Name: Metoprolol Succinate ER 50 MG Oral Tablet Extended Release 24 Hour] 90 tablet 3    Sig: TAKE 1 TABLET BY MOUTH DAILY  WITH OR IMMEDIATELY FOLLOWING A  MEAL     Cardiovascular:  Beta Blockers Failed - 01/14/2023  4:56 AM      Failed - Valid encounter within last 6 months    Recent Outpatient Visits           6 months ago Encounter for annual wellness exam in Medicare patient   [place] [facility] Charles Grooms, NP   1 year ago Annual physical exam   [place] [facility] Charles Grooms, NP   1 year ago Essential hypertension   [facility] Charles Grooms, NP   2 years ago Annual physical exam   [place] [facility] Charles Grooms, NP   2 years ago Essential hypertension   [place] [facility] Charles Grooms, NP       Future Appointments             In 3 weeks Charles Grooms, NP [place] [facility]            Passed - Last BP in normal range    BP Readings from Last 1 Encounters:  11/16/22 129/80         Passed - Last Heart Rate in normal range    Pulse Readings from Last 1 Encounters:  11/16/22 100

## 2023-01-24 ENCOUNTER — Other Ambulatory Visit: Payer: Self-pay | Admitting: Cardiovascular Disease

## 2023-01-24 ENCOUNTER — Other Ambulatory Visit: Payer: Self-pay | Admitting: Nurse Practitioner

## 2023-01-24 ENCOUNTER — Other Ambulatory Visit: Payer: Self-pay | Admitting: Physician Assistant

## 2023-01-26 NOTE — Telephone Encounter (Signed)
Requested Prescriptions  Pending Prescriptions Disp Refills   omeprazole (PRILOSEC) 40 MG capsule [Pharmacy Med Name: Omeprazole 40 MG Oral Capsule Delayed Release] 100 capsule 1    Sig: TAKE 1 CAPSULE BY MOUTH DAILY     Gastroenterology: Proton Pump Inhibitors Passed - 01/24/2023  6:45 PM      Passed - Valid encounter within last 12 months    Recent Outpatient Visits           6 months ago Encounter for annual wellness exam in Medicare patient   [place] [facility] Larae Grooms, NP   1 year ago Annual physical exam   [place] [facility] Larae Grooms, NP   1 year ago Essential hypertension   [place] [facility] Lar[facility] G[facility], NP   2 years ago Annual physical exam   [place] [facility] Larae Grooms, NP   2 years ago Essential hypertension   [place] [facility] Larae Grooms, NP       Future Appointments             In 1 week Pearley, Sherran Needs, NP  [facility]

## 2023-01-26 NOTE — Telephone Encounter (Signed)
Requested medication (s) are due for refill today: no  Requested medication (s) are on the active medication list:yes  Last refill:  01/06/23 #30  Future visit scheduled:yes  Notes to clinic:  last refill was a 30 day courtesy refill    Requested Prescriptions  Pending Prescriptions Disp Refills   hydrochlorothiazide (HYDRODIURIL) 12.5 MG tablet [Pharmacy Med Name: hydroCHLOROthiazide 12.5 MG Oral Tablet] 30 tablet 11    Sig: TAKE 1 TABLET BY MOUTH DAILY     Cardiovascular: Diuretics - Thiazide Failed - 01/24/2023  6:45 PM      Failed - Cr in normal range and within 180 days    Creatinine, Ser  Date Value Ref Range Status  07/02/2022 0.80 0.76 - 1.27 mg/dL Final         Failed - K in normal range and within 180 days    Potassium  Date Value Ref Range Status  07/02/2022 4.8 3.5 - 5.2 mmol/L Final         Failed - Na in normal range and within 180 days    Sodium  Date Value Ref Range Status  07/02/2022 132 (L) 134 - 144 mmol/L Final         Failed - Valid encounter within last 6 months    Recent Outpatient Visits           6 months ago Encounter for annual wellness exam in Medicare patient   [place] [facility] Larae Grooms, NP   1 year ago Annual physical exam   [place] [facility] Larae Grooms, NP   1 year ago Essential hypertension   [facility] Larae Grooms, NP   2 years ago Annual physical exam   [place] [facility] Larae Grooms, NP   2 years ago Essential hypertension   [place] [facility] Larae Grooms, NP       Future Appointments             In 1 week Pearley, Sherran Needs, NP [place] [facility]            Passed - Last BP in normal range    BP Readings from Last 1 Encounters:  11/16/22 129/80          lisinopril (ZESTRIL) 20 MG tablet [Pharmacy Med Name: Lisinopril 20 MG Oral Tablet] 90 tablet 3     Sig: TAKE 1 TABLET BY MOUTH DAILY     Cardiovascular:  ACE Inhibitors Failed - 01/24/2023  6:45 PM      Failed - Cr in normal range and within 180 days    Creatinine, Ser  Date Value Ref Range Status  07/02/2022 0.80 0.76 - 1.27 mg/dL Final         Failed - K in normal range and within 180 days    Potassium  Date Value Ref Range Status  07/02/2022 4.8 3.5 - 5.2 mmol/L Final         Failed - Valid encounter within last 6 months    Recent Outpatient Visits           6 months ago Encounter for annual wellness exam in Medicare patient   [place] [facility] Larae Grooms, NP   1 year ago Annual physical exam   [place] [facility] Larae Grooms, NP   1 year ago Essential hypertension   [place] [facility] Larae Grooms, NP   2 years ago  Annual physical exam   [place] [facility] Larae Grooms, NP   2 years ago Essential hypertension   [place] [facility] Larae Grooms, NP       Future Appointments             In 1 week Pearley, Sherran Needs, NP [place] [facility]            Passed - Patient is not pregnant      Passed - Last BP in normal range    BP Readings from Last 1 Encounters:  11/16/22 129/80          amLODipine (NORVASC) 10 MG tablet [Pharmacy Med Name: amLODIPine Besylate 10 MG Oral Tablet] 90 tablet 3    Sig: TAKE 1 TABLET BY MOUTH DAILY     Cardiovascular: Calcium Channel Blockers 2 Failed - 01/24/2023  6:45 PM      Failed - Valid encounter within last 6 months    Recent Outpatient Visits           6 months ago Encounter for annual wellness exam in Medicare patient   [place] [facility] Larae Grooms, NP   1 year ago Annual physical exam   [place] [facility] Larae Grooms, NP   1 year ago Essential hypertension   [place] [facility] Larae Grooms, NP   2  years ago Annual physical exam   [place] [facility] Larae Grooms, NP   2 years ago Essential hypertension   [place] [facility] Larae Grooms, NP       Future Appointments             In 1 week Pearley, Sherran Needs, NP [place] [facility]            Passed - Last BP in normal range    BP Readings from Last 1 Encounters:  11/16/22 129/80         Passed - Last Heart Rate in normal range    Pulse Readings from Last 1 Encounters:  11/16/22 100

## 2023-01-31 ENCOUNTER — Other Ambulatory Visit: Payer: Self-pay | Admitting: Nurse Practitioner

## 2023-02-01 NOTE — Telephone Encounter (Signed)
Requested Prescriptions  Pending Prescriptions Disp Refills   amLODipine (NORVASC) 10 MG tablet [Pharmacy Med Name: amLODIPine Besylate 10 MG Oral Tablet] 90 tablet 0    Sig: TAKE 1 TABLET BY MOUTH DAILY     Cardiovascular: Calcium Channel Blockers 2 Failed - 01/31/2023  7:38 PM      Failed - Valid encounter within last 6 months    Recent Outpatient Visits           7 months ago Encounter for annual wellness exam in Medicare patient   [place] [facility] Larae Grooms, NP   1 year ago Annual physical exam   [place] [facility] Larae Grooms, NP   1 year ago Essential hypertension   [place] [facility] Larae Grooms, NP   2 years ago Annual physical exam   [place] [facility] Larae Grooms, NP   2 years ago Essential hypertension   [place] [facility] Larae Grooms, NP       Future Appointments             In 1 week Pearley, Sherran Needs, NP [place] [facility]            Passed - Last BP in normal range    BP Readings from Last 1 Encounters:  11/16/22 129/80         Passed - Last Heart Rate in normal range    Pulse Readings from Last 1 Encounters:  11/16/22 100          lisinopril (ZESTRIL) 20 MG tablet [Pharmacy Med Name: Lisinopril 20 MG Oral Tablet] 90 tablet 0    Sig: TAKE 1 TABLET BY MOUTH DAILY     Cardiovascular:  ACE Inhibitors Failed - 01/31/2023  7:38 PM      Failed - Cr in normal range and within 180 days    Creatinine, Ser  Date Value Ref Range Status  07/02/2022 0.80 0.76 - 1.27 mg/dL Final         Failed - K in normal range and within 180 days    Potassium  Date Value Ref Range Status  07/02/2022 4.8 3.5 - 5.2 mmol/L Final         Failed - Valid encounter within last 6 months    Recent Outpatient Visits           7 months ago Encounter for annual wellness exam in Medicare patient   [place] [facility] Larae Grooms, NP   1 year ago Annual physical exam   [facility] Larae Grooms, NP   1 year ago Essential hypertension   [place] [facility] Larae Grooms, NP   2 years ago Annual physical exam   [place] [facility] Larae Grooms, NP   2 years ago Essential hypertension   [place] [facility] Larae Grooms, NP       Future Appointments             In 1 week Pearley, Sherran Needs, NP [place] [facility]            Passed - Patient is not pregnant      Passed - Last BP in normal range    BP Readings from Last 1 Encounters:  11/16/22 129/80          hydrochlorothiazide (HYDRODIURIL) 12.5 MG tablet [Pharmacy Med Name: hydroCHLOROthiazide 12.5 MG Oral  Tablet] 90 tablet 0    Sig: TAKE 1 TABLET BY MOUTH DAILY     Cardiovascular: Diuretics - Thiazide Failed - 01/31/2023  7:38 PM      Failed - Cr in normal range and within 180 days    Creatinine, Ser  Date Value Ref Range Status  07/02/2022 0.80 0.76 - 1.27 mg/dL Final         Failed - K in normal range and within 180 days    Potassium  Date Value Ref Range Status  07/02/2022 4.8 3.5 - 5.2 mmol/L Final         Failed - Na in normal range and within 180 days    Sodium  Date Value Ref Range Status  07/02/2022 132 (L) 134 - 144 mmol/L Final         Failed - Valid encounter within last 6 months    Recent Outpatient Visits           7 months ago Encounter for annual wellness exam in Medicare patient   [place] [facility] Larae Grooms, NP   1 year ago Annual physical exam   [place] [facility] Larae Grooms, NP   1 year ago Essential hypertension   [place] [facility] Larae Grooms, NP   2 years ago Annual physical exam   [place] [facility] Larae Grooms, NP   2 years ago Essential hypertension   [facility] Larae Grooms, NP       Future Appointments             In 1 week Pearley, Sherran Needs, NP  [facility]            Passed - Last BP in normal range    BP Readings from Last 1 Encounters:  11/16/22 129/80

## 2023-02-08 ENCOUNTER — Ambulatory Visit (INDEPENDENT_AMBULATORY_CARE_PROVIDER_SITE_OTHER): Payer: Medicare Other | Admitting: Family Medicine

## 2023-02-08 VITALS — BP 131/88 | HR 77 | Temp 98.2°F | Ht 69.69 in | Wt 217.2 lb

## 2023-02-08 DIAGNOSIS — E782 Mixed hyperlipidemia: Secondary | ICD-10-CM | POA: Diagnosis not present

## 2023-02-08 DIAGNOSIS — J441 Chronic obstructive pulmonary disease with (acute) exacerbation: Secondary | ICD-10-CM | POA: Diagnosis not present

## 2023-02-08 DIAGNOSIS — K219 Gastro-esophageal reflux disease without esophagitis: Secondary | ICD-10-CM | POA: Diagnosis not present

## 2023-02-08 DIAGNOSIS — I10 Essential (primary) hypertension: Secondary | ICD-10-CM | POA: Diagnosis not present

## 2023-02-08 MED ORDER — FLUTICASONE-SALMETEROL 500-50 MCG/ACT IN AEPB: INHALATION_SPRAY | RESPIRATORY_TRACT | 3 refills | Status: DC

## 2023-02-08 MED ORDER — OMEPRAZOLE 40 MG PO CPDR: 40.0000 mg | DELAYED_RELEASE_CAPSULE | Freq: Every day | ORAL | 1 refills | Status: DC

## 2023-02-08 MED ORDER — AMLODIPINE BESYLATE 10 MG PO TABS: 10.0000 mg | ORAL_TABLET | Freq: Every day | ORAL | 0 refills | Status: DC

## 2023-02-08 MED ORDER — HYDROCHLOROTHIAZIDE 12.5 MG PO TABS: 12.5000 mg | ORAL_TABLET | Freq: Every day | ORAL | 0 refills | Status: DC

## 2023-02-08 MED ORDER — ALBUTEROL SULFATE HFA 108 (90 BASE) MCG/ACT IN AERS: 2.0000 | INHALATION_SPRAY | Freq: Four times a day (QID) | RESPIRATORY_TRACT | 1 refills | Status: DC | PRN

## 2023-02-08 MED ORDER — LISINOPRIL 20 MG PO TABS: 20.0000 mg | ORAL_TABLET | Freq: Every day | ORAL | 0 refills | Status: DC

## 2023-02-08 MED ORDER — METOPROLOL SUCCINATE ER 50 MG PO TB24: ORAL_TABLET | ORAL | 0 refills | Status: DC

## 2023-02-08 NOTE — Assessment & Plan Note (Signed)
Stable on PPI, continue present medications. 

## 2023-02-08 NOTE — Assessment & Plan Note (Addendum)
Chronic.  Controlled. BP today 131/88. Continue current regimen of HCTZ, Amlodipine, Lisinopril, and Metoprolol. Not checking at home, advise to start once weekly. Refills sent today.  Labs ordered today.  Recommend DASH diet and increased physical activity. Return to clinic in 6 months for reevaluation.  Call sooner if concerns arise.

## 2023-02-08 NOTE — Assessment & Plan Note (Signed)
Chronic. Controlled. Cardiology increased Atorvastatin to 40mg , continue with this dose. Labs done today. Recommend continued follow up with Cardiology as recommended. Follow up in 6 months. Call sooner if concerns arise.

## 2023-02-08 NOTE — Progress Notes (Unsigned)
BP 131/88   Pulse 77   Temp 98.2 F (36.8 C) (Oral)   Ht 5' 9.69" (1.77 m)   Wt 217 lb 3.2 oz (98.5 kg)   SpO2 97%   BMI 31.45 kg/m    Subjective:    Patient ID: Charles Bryant, male    DOB: 1956/04/07, 67 y.o.   MRN: [id number]  HPI: Charles Bryant is a 67 y.o. male  Chief Complaint  Patient presents with   Diabetes   COPD   Asthma   Gastroesophageal Reflux   GERD  He is taking Omeprazole 40 MG daily  GERD control status: controlled Satisfied with current treatment? yes Heartburn frequency: None Medication side effects: no  Medication compliance: better Dysphagia: no Odynophagia:  no Hematemesis: no Blood in stool: no EGD:  Cologuard negative 2022   HYPERTENSION / HYPERLIPIDEMIA Taking Amlodipine 10 MG, Lisinopril 20 MG, Hydrochlorathiazide 12.5MG  and metoprolol 50MG . He is also taking Atorvastatin 40MG .   Satisfied with current treatment? Yes Duration of hypertension: years BP monitoring frequency:no BP range: Not checking BP medication side effects: no Past BP meds:  amlodipine, quinapril, HCTZ, Metoprolol Duration of hyperlipidemia: years Cholesterol medication side effects: no Cholesterol supplements: none Past cholesterol medications: atorvastain (lipitor) Medication compliance: excellent compliance Aspirin: yes Recent stressors: no Recurrent headaches: no Visual changes: no Palpitations: no Dyspnea: no Chest pain: no Lower extremity edema: no Dizzy/lightheaded: no  Diet: Admits to daily intake of fruits, vegetables, protein, and carbohydrates, states it could be better.  Water: 18 ounces daily  Physical activity: Not as active as he used to be   COPD/ASTHMA Using Fluticasone-salmeterol inhaler BID and has Albuterol rescue as needed COPD status: controlled, using albuterol  Satisfied with current treatment?: yes Oxygen use: no Dyspnea frequency: No Cough frequency: No Rescue inhaler frequency: rarely, seasonal Limitation of activity:  no Productive cough: No Pneumovax: Up to Date Influenza: Up to Date  Relevant past medical, surgical, family and social history reviewed and updated as indicated. Interim medical history since our last visit reviewed. Allergies and medications reviewed and updated.  Review of Systems  Constitutional:  Negative for fatigue.  Eyes:  Negative for visual disturbance.  Respiratory: Negative.    Cardiovascular: Negative.   Gastrointestinal: Negative.   Neurological:  Negative for dizziness, weakness, light-headedness and headaches.    Per HPI unless specifically indicated above     Objective:    BP 131/88   Pulse 77   Temp 98.2 F (36.8 C) (Oral)   Ht 5' 9.69" (1.77 m)   Wt 217 lb 3.2 oz (98.5 kg)   SpO2 97%   BMI 31.45 kg/m   Wt Readings from Last 3 Encounters:  02/08/23 217 lb 3.2 oz (98.5 kg)  07/06/22 217 lb (98.4 kg)  07/02/22 217 lb 14.4 oz (98.8 kg)    Physical Exam Vitals and nursing note reviewed.  Constitutional:      General: He is awake. He is not in acute distress.    Appearance: Normal appearance. He is well-developed and well-groomed. He is obese. He is not ill-appearing.  HENT:     Head: Normocephalic and atraumatic.     Right Ear: Hearing and external ear normal. No drainage.     Left Ear: Hearing and external ear normal. No drainage.     Nose: Nose normal.  Eyes:     General: Lids are normal.        Right eye: No discharge.        Left eye: No discharge.  Conjunctiva/sclera: Conjunctivae normal.  Cardiovascular:     Rate and Rhythm: Normal rate and regular rhythm.     Pulses:          Radial pulses are 2+ on the right side and 2+ on the left side.       Posterior tibial pulses are 2+ on the right side and 2+ on the left side.     Heart sounds: Normal heart sounds, S1 normal and S2 normal. No murmur heard.    No gallop.  Pulmonary:     Effort: Pulmonary effort is normal. No accessory muscle usage or respiratory distress.     Breath sounds:  Normal breath sounds. No stridor or decreased air movement. No wheezing, rhonchi or rales.     Comments: O2: 97% Abdominal:     General: Abdomen is protuberant. Bowel sounds are normal.     Palpations: Abdomen is soft.     Tenderness: There is no abdominal tenderness.  Musculoskeletal:        General: Normal range of motion.     Cervical back: Full passive range of motion without pain and normal range of motion.     Right lower leg: No edema.     Left lower leg: No edema.  Skin:    General: Skin is warm and dry.     Capillary Refill: Capillary refill takes less than 2 seconds.  Neurological:     Mental Status: He is alert and oriented to person, place, and time.  Psychiatric:        Attention and Perception: Attention normal.        Mood and Affect: Mood and affect normal.        Speech: Speech normal.        Behavior: Behavior normal. Behavior is cooperative.        Thought Content: Thought content normal.     Results for orders placed or performed in visit on 02/08/23  Lipid Profile  Result Value Ref Range   Cholesterol, Total 148 100 - 199 mg/dL   Triglycerides 59 0 - 149 mg/dL   HDL 69 >16 mg/dL   VLDL Cholesterol Cal 12 5 - 40 mg/dL   LDL Chol Calc (NIH) 67 0 - 99 mg/dL   Chol/HDL Ratio 2.1 0.0 - 5.0 ratio  Comp Met (CMET)  Result Value Ref Range   Glucose 114 (H) 70 - 99 mg/dL   BUN 9 8 - 27 mg/dL   Creatinine, Ser 1.09 0.76 - 1.27 mg/dL   eGFR 96 >60 AV/WUJ/8.11   BUN/Creatinine Ratio 11 10 - 24   Sodium 136 134 - 144 mmol/L   Potassium 4.3 3.5 - 5.2 mmol/L   Chloride 97 96 - 106 mmol/L   CO2 25 20 - 29 mmol/L   Calcium 9.6 8.6 - 10.2 mg/dL   Total Protein 6.7 6.0 - 8.5 g/dL   Albumin 4.4 3.9 - 4.9 g/dL   Globulin, Total 2.3 1.5 - 4.5 g/dL   Bilirubin Total 0.4 0.0 - 1.2 mg/dL   Alkaline Phosphatase 131 (H) 44 - 121 IU/L   AST 29 0 - 40 IU/L   ALT 36 0 - 44 IU/L      Assessment & Plan:   Problem List Items Addressed This Visit     Hyperlipidemia     Chronic. Controlled. Cardiology increased Atorvastatin to 40mg , continue with this dose. Labs done today. Recommend continued follow up with Cardiology as recommended. Follow up in 6 months. Call sooner if  concerns arise.       Relevant Medications   metoprolol succinate (TOPROL-XL) 50 MG 24 hr tablet   amLODipine (NORVASC) 10 MG tablet   hydrochlorothiazide (HYDRODIURIL) 12.5 MG tablet   lisinopril (ZESTRIL) 20 MG tablet   Other Relevant Orders   Lipid Profile (Completed)   GERD (gastroesophageal reflux disease)    Stable on PPI, continue present medications.      Relevant Medications   omeprazole (PRILOSEC) 40 MG capsule   COPD (chronic obstructive pulmonary disease) (HCC)    Chronic.  Controlled.  Continue with current medication regimen wixela and PRN albuterol.  Has 30 pack history of smoking.   Labs ordered today.  Return to clinic in 6 months for reevaluation.  Call sooner if concerns arise.        Relevant Medications   albuterol (VENTOLIN HFA) 108 (90 Base) MCG/ACT inhaler   fluticasone-salmeterol (WIXELA INHUB) 500-50 MCG/ACT AEPB   Essential hypertension - Primary    Chronic.  Controlled. BP today 131/88. Continue current regimen of HCTZ, Amlodipine, Lisinopril, and Metoprolol. Not checking at home, advise to start once weekly. Refills sent today.  Labs ordered today.  Recommend DASH diet and increased physical activity to 150 mins weekly. Return to clinic in 6 months for reevaluation.  Call sooner if concerns arise.         Relevant Medications   metoprolol succinate (TOPROL-XL) 50 MG 24 hr tablet   amLODipine (NORVASC) 10 MG tablet   hydrochlorothiazide (HYDRODIURIL) 12.5 MG tablet   lisinopril (ZESTRIL) 20 MG tablet   Other Relevant Orders   Comp Met (CMET) (Completed)   Microalbumin, Urine Waived     Follow up plan: Return in about 6 months (around 08/09/2023) for Annual wellness visit.

## 2023-02-08 NOTE — Assessment & Plan Note (Signed)
Chronic.  Controlled.  Continue with current medication regimen wixela and PRN albuterol.  Has 30 pack history of smoking.   Labs ordered today.  Return to clinic in 6 months for reevaluation.  Call sooner if concerns arise.

## 2023-02-09 LAB — LIPID PANEL
Chol/HDL Ratio: 2.1 {ratio} (ref 0.0–5.0)
Cholesterol, Total: 148 mg/dL (ref 100–199)
HDL: 69 mg/dL (ref >39)
LDL Chol Calc (NIH): 67 mg/dL (ref 0–99)
Triglycerides: 59 mg/dL (ref 0–149)
VLDL Cholesterol Cal: 12 mg/dL (ref 5–40)

## 2023-02-09 LAB — COMPREHENSIVE METABOLIC PANEL
ALT: 36 [IU]/L (ref 0–44)
AST: 29 [IU]/L (ref 0–40)
Albumin: 4.4 g/dL (ref 3.9–4.9)
Alkaline Phosphatase: 131 [IU]/L — ABNORMAL HIGH (ref 44–121)
BUN/Creatinine Ratio: 11 (ref 10–24)
BUN: 9 mg/dL (ref 8–27)
Bilirubin Total: 0.4 mg/dL (ref 0.0–1.2)
CO2: 25 mmol/L (ref 20–29)
Calcium: 9.6 mg/dL (ref 8.6–10.2)
Chloride: 97 mmol/L (ref 96–106)
Creatinine, Ser: 0.79 mg/dL (ref 0.76–1.27)
Globulin, Total: 2.3 g/dL (ref 1.5–4.5)
Glucose: 114 mg/dL — ABNORMAL HIGH (ref 70–99)
Potassium: 4.3 mmol/L (ref 3.5–5.2)
Sodium: 136 mmol/L (ref 134–144)
Total Protein: 6.7 g/dL (ref 6.0–8.5)
eGFR: 96 mL/min/{1.73_m2} (ref >59)

## 2023-02-14 NOTE — Progress Notes (Signed)
Hi Charles Bryant your electrolytes, kidney function, and liver function have returned as normal. Your cholesterol levels are normal and stable. Thank you for allowing me to participate in your care.

## 2023-03-01 ENCOUNTER — Other Ambulatory Visit: Payer: Self-pay | Admitting: Cardiovascular Disease

## 2023-04-13 ENCOUNTER — Other Ambulatory Visit: Payer: Self-pay

## 2023-04-13 ENCOUNTER — Encounter: Payer: Self-pay | Admitting: Nurse Practitioner

## 2023-04-13 ENCOUNTER — Ambulatory Visit: Payer: Medicare Other

## 2023-04-13 ENCOUNTER — Emergency Department: Payer: Medicare Other

## 2023-04-13 ENCOUNTER — Emergency Department
Admission: EM | Admit: 2023-04-13 | Discharge: 2023-04-13 | Disposition: A | Discharge status: Home / Self Care | Payer: Medicare Other | Attending: Emergency Medicine | Admitting: Emergency Medicine

## 2023-04-13 DIAGNOSIS — Z7982 Long term (current) use of aspirin: Secondary | ICD-10-CM | POA: Diagnosis not present

## 2023-04-13 DIAGNOSIS — Z20822 Contact with and (suspected) exposure to covid-19: Secondary | ICD-10-CM | POA: Diagnosis not present

## 2023-04-13 DIAGNOSIS — I10 Essential (primary) hypertension: Secondary | ICD-10-CM | POA: Insufficient documentation

## 2023-04-13 DIAGNOSIS — J45909 Unspecified asthma, uncomplicated: Secondary | ICD-10-CM | POA: Insufficient documentation

## 2023-04-13 DIAGNOSIS — R059 Cough, unspecified: Secondary | ICD-10-CM | POA: Diagnosis present

## 2023-04-13 DIAGNOSIS — Z7951 Long term (current) use of inhaled steroids: Secondary | ICD-10-CM | POA: Insufficient documentation

## 2023-04-13 DIAGNOSIS — Z79899 Other long term (current) drug therapy: Secondary | ICD-10-CM | POA: Insufficient documentation

## 2023-04-13 DIAGNOSIS — J441 Chronic obstructive pulmonary disease with (acute) exacerbation: Secondary | ICD-10-CM | POA: Insufficient documentation

## 2023-04-13 LAB — RESP PANEL BY RT-PCR (RSV, FLU A&B, COVID)  RVPGX2
Influenza A by PCR: NEGATIVE
Influenza B by PCR: NEGATIVE
Resp Syncytial Virus by PCR: NEGATIVE
SARS Coronavirus 2 by RT PCR: NEGATIVE

## 2023-04-13 LAB — COMPREHENSIVE METABOLIC PANEL
ALT: 36 U/L (ref 0–44)
AST: 28 U/L (ref 15–41)
Albumin: 4 g/dL (ref 3.5–5.0)
Alkaline Phosphatase: 106 U/L (ref 38–126)
Anion gap: 11 (ref 5–15)
BUN: 11 mg/dL (ref 8–23)
CO2: 24 mmol/L (ref 22–32)
Calcium: 9.2 mg/dL (ref 8.9–10.3)
Chloride: 95 mmol/L — ABNORMAL LOW (ref 98–111)
Creatinine, Ser: 0.79 mg/dL (ref 0.61–1.24)
GFR, Estimated: >60 mL/min (ref >60)
Glucose, Bld: 114 mg/dL — ABNORMAL HIGH (ref 70–99)
Potassium: 3.9 mmol/L (ref 3.5–5.1)
Sodium: 130 mmol/L — ABNORMAL LOW (ref 135–145)
Total Bilirubin: 0.4 mg/dL (ref <1.2)
Total Protein: 7.2 g/dL (ref 6.5–8.1)

## 2023-04-13 LAB — CBC
HCT: 41.6 % (ref 39.0–52.0)
Hemoglobin: 14.3 g/dL (ref 13.0–17.0)
MCH: 31.7 pg (ref 26.0–34.0)
MCHC: 34.4 g/dL (ref 30.0–36.0)
MCV: 92.2 fL (ref 80.0–100.0)
Platelets: 187 10*3/uL (ref 150–400)
RBC: 4.51 MIL/uL (ref 4.22–5.81)
RDW: 13.4 % (ref 11.5–15.5)
WBC: 10.3 10*3/uL (ref 4.0–10.5)
nRBC: 0 % (ref 0.0–0.2)

## 2023-04-13 LAB — TROPONIN I (HIGH SENSITIVITY)
Troponin I (High Sensitivity): 5 ng/L (ref <18)
Troponin I (High Sensitivity): 5 ng/L (ref <18)

## 2023-04-13 LAB — D-DIMER, QUANTITATIVE: D-Dimer, Quant: 0.46 ug{FEU}/mL (ref 0.00–0.50)

## 2023-04-13 MED ORDER — IPRATROPIUM BROMIDE 0.02 % IN SOLN
0.5000 mg | Freq: Once | RESPIRATORY_TRACT | Status: AC
  Administered 2023-04-13: 0.5 mg via RESPIRATORY_TRACT
  Filled 2023-04-13: qty 2.5

## 2023-04-13 MED ORDER — ALBUTEROL SULFATE (2.5 MG/3ML) 0.083% IN NEBU
5.0000 mg | INHALATION_SOLUTION | Freq: Once | RESPIRATORY_TRACT | Status: AC
Start: 2023-04-13 — End: 2023-04-13
  Administered 2023-04-13: 5 mg via RESPIRATORY_TRACT
  Filled 2023-04-13: qty 6

## 2023-04-13 MED ORDER — IPRATROPIUM-ALBUTEROL 0.5-2.5 (3) MG/3ML IN SOLN: 3.0000 mL | Freq: Four times a day (QID) | RESPIRATORY_TRACT | 1 refills | Status: DC | PRN

## 2023-04-13 MED ORDER — METHYLPREDNISOLONE SODIUM SUCC 125 MG IJ SOLR
125.0000 mg | Freq: Once | INTRAMUSCULAR | Status: AC
  Administered 2023-04-13: 125 mg via INTRAVENOUS
  Filled 2023-04-13: qty 2

## 2023-04-13 MED ORDER — ALBUTEROL SULFATE (2.5 MG/3ML) 0.083% IN NEBU
5.0000 mg | INHALATION_SOLUTION | Freq: Once | RESPIRATORY_TRACT | Status: AC
  Administered 2023-04-13: 5 mg via RESPIRATORY_TRACT
  Filled 2023-04-13: qty 6

## 2023-04-13 MED ORDER — PREDNISONE 20 MG PO TABS: 60.0000 mg | ORAL_TABLET | Freq: Every day | ORAL | 0 refills | Status: DC

## 2023-04-13 NOTE — ED Provider Notes (Signed)
[facility] Provider Note    Event Date/Time   First MD Initiated Contact with Patient 04/13/23 0107     (approximate)   History   Shortness of Breath   HPI  Charles Bryant is a 67 y.o. male with history of asthma, COPD, hypertension, hyperlipidemia who presents to the emergency department with complaints of cough, shortness of breath and wheezing.  No fevers, chest pain, lower extremity swelling or discomfort.   History provided by patient and wife.    Past Medical History:  Diagnosis Date   Advanced care planning/counseling discussion 07/02/2022   Allergy Childhood   Asthma    COPD (chronic obstructive pulmonary disease) (HCC)    COPD (chronic obstructive pulmonary disease) (HCC)    GERD (gastroesophageal reflux disease)    Hyperlipidemia    Hypertension     Past Surgical History:  Procedure Laterality Date   carotid surgery  2009   CATARACT EXTRACTION Bilateral 09/2021   EYE SURGERY  6 years ago   Blepharoplasty    MEDICATIONS:  Prior to Admission medications   Medication Sig Start Date End Date Taking? Authorizing Provider  albuterol (VENTOLIN HFA) 108 (90 Base) MCG/ACT inhaler Inhale 2 puffs into the lungs every 6 (six) hours as needed for wheezing or shortness of breath. 02/08/23   Pearley, Sherran Needs, NP  amLODipine (NORVASC) 10 MG tablet Take 1 tablet (10 mg total) by mouth daily. 02/08/23   Pearley, Sherran Needs, NP  aspirin EC 81 MG tablet Take 81 mg by mouth daily. Swallow whole.    [provider]  atorvastatin (LIPITOR) 40 MG tablet TAKE 1 TABLET BY MOUTH DAILY 03/02/23   Runell Gess, MD  fluticasone-salmeterol [facility] INHUB) 500-50 MCG/ACT AEPB USE 1 INHALATION BY MOUTH  TWICE DAILY 02/08/23   Pearley, Sherran Needs, NP  hydrochlorothiazide (HYDRODIURIL) 12.5 MG tablet Take 1 tablet (12.5 mg total) by mouth daily. 02/08/23   Pearley, Sherran Needs, NP  lisinopril (ZESTRIL) 20 MG tablet Take 1 tablet (20 mg  total) by mouth daily. 02/08/23   Pearley, Sherran Needs, NP  metoprolol succinate (TOPROL-XL) 50 MG 24 hr tablet TAKE 1 TABLET BY MOUTH DAILY  WITH OR IMMEDIATELY FOLLOWING A  MEAL 02/08/23   Pearley, Sherran Needs, NP  omeprazole (PRILOSEC) 40 MG capsule Take 1 capsule (40 mg total) by mouth daily. 02/08/23   Pearley, Sherran Needs, NP  tadalafil (CIALIS) 20 MG tablet Take 1 tablet (20 mg total) by mouth daily as needed for erectile dysfunction. 12/18/19   Particia Nearing, PA-C    Physical Exam   Triage Vital Signs: ED Triage Vitals  Encounter Vitals Group     BP 04/13/23 0056 (!) 145/69     Systolic BP Percentile --      Diastolic BP Percentile --      Pulse Rate 04/13/23 0056 (!) 119     Resp 04/13/23 0056 20     Temp 04/13/23 0056 97.9 F (36.6 C)     Temp Source 04/13/23 0056 Oral     SpO2 04/13/23 0056 93 %     Weight 04/13/23 0057 215 lb (97.5 kg)     Height 04/13/23 0057 5\' 10"  (1.778 m)     Head Circumference --      Peak Flow --      Pain Score 04/13/23 0057 0     Pain Loc --      Pain Education --      Exclude from Growth Chart --  Most recent vital signs: Vitals:   04/13/23 0300 04/13/23 0330  BP: (!) 140/75 (!) 145/66  Pulse: (!) 110 (!) 117  Resp: 13 19  Temp:    SpO2: 98% 92%    CONSTITUTIONAL: Alert, responds appropriately to questions. Well-appearing; well-nourished HEAD: Normocephalic, atraumatic EYES: Conjunctivae clear, pupils appear equal, sclera nonicteric ENT: normal nose; moist mucous membranes NECK: Supple, normal ROM CARD: Regular and tachycardic; S1 and S2 appreciated RESP: Normal chest excursion without splinting or tachypnea; scattered wheezing heard diffusely.  Diminished aeration at bases bilaterally.  No rhonchi or rales.  No hypoxia.  No respiratory distress.  Speaking full sentences.  Able to ambulate without respiratory distress. ABD/GI: Non-distended; soft, non-tender, no rebound, no guarding, no peritoneal signs BACK:  The back appears normal EXT: Normal ROM in all joints; no deformity noted, no edema, no calf tenderness or calf swelling SKIN: Normal color for age and race; warm; no rash on exposed skin NEURO: Moves all extremities equally, normal speech PSYCH: The patient's mood and manner are appropriate.   ED Results / Procedures / Treatments   LABS: (all labs ordered are listed, but only abnormal results are displayed) Labs Reviewed  COMPREHENSIVE METABOLIC PANEL - Abnormal; Notable for the following components:      Result Value   Sodium 130 (*)    Chloride 95 (*)    Glucose, Bld 114 (*)    All other components within normal limits  RESP PANEL BY RT-PCR (RSV, FLU A&B, COVID)  RVPGX2  CBC  D-DIMER, QUANTITATIVE (NOT AT [facility])  TROPONIN I (HIGH SENSITIVITY)  TROPONIN I (HIGH SENSITIVITY)     EKG:  EKG Interpretation Date/Time:  Wednesday April 13 2023 00:59:56 EST Ventricular Rate:  116 PR Interval:  198 QRS Duration:  90 QT Interval:  300 QTC Calculation: 417 R Axis:   52  Text Interpretation: Sinus tachycardia with Premature atrial complexes Nonspecific ST and T wave abnormality Abnormal ECG No previous ECGs available Confirmed by Rochele Raring [id number]) on 04/13/2023 1:08:04 AM         RADIOLOGY: My personal review and interpretation of imaging: Chest x-ray clear.  I have personally reviewed all radiology reports.   DG Chest 2 View  Result Date: 04/13/2023 CLINICAL DATA:  sob EXAM: CHEST - 2 VIEW COMPARISON:  CT cardiac 02/19/2022 FINDINGS: The heart and mediastinal contours are within normal limits. No focal consolidation. No pulmonary edema. No pleural effusion. No pneumothorax. No acute osseous abnormality. Question sclerotic lesion of the midthoracic vertebral body. IMPRESSION: 1. No active cardiopulmonary disease. 2. Question sclerotic lesion of the midthoracic vertebral body. Electronically Signed   By: Tish Frederickson M.D.   On: 04/13/2023 01:52      PROCEDURES:  Critical Care performed: No     .1-3 Lead EKG Interpretation  Performed by: Rahsaan Weakland, Layla Maw, DO Authorized by: Shalae Belmonte, Layla Maw, DO     Interpretation: abnormal     ECG rate:  117   ECG rate assessment: tachycardic     Rhythm: sinus tachycardia     Ectopy: none     Conduction: normal       IMPRESSION / MDM / ASSESSMENT AND PLAN / ED COURSE  I reviewed the triage vital signs and the nursing notes.    Patient here with shortness of breath, wheezing.  The patient is on the cardiac monitor to evaluate for evidence of arrhythmia and/or significant heart rate changes.   DIFFERENTIAL DIAGNOSIS (includes but not limited to):   Asthma/COPD exacerbation, pneumonia,  viral URI, PE, less likely ACS, CHF   Patient's presentation is most consistent with acute presentation with potential threat to life or bodily function.   PLAN: Will obtain labs, COVID and flu swab, chest x-ray.  Will give breathing treatments, steroids.   MEDICATIONS GIVEN IN ED: Medications  albuterol (PROVENTIL) (2.5 MG/3ML) 0.083% nebulizer solution 5 mg (5 mg Nebulization Given 04/13/23 0150)  ipratropium (ATROVENT) nebulizer solution 0.5 mg (0.5 mg Nebulization Given 04/13/23 0150)  methylPREDNISolone sodium succinate (SOLU-MEDROL) 125 mg/2 mL injection 125 mg (125 mg Intravenous Given 04/13/23 0146)  albuterol (PROVENTIL) (2.5 MG/3ML) 0.083% nebulizer solution 5 mg (5 mg Nebulization Given 04/13/23 0255)  ipratropium (ATROVENT) nebulizer solution 0.5 mg (0.5 mg Nebulization Given 04/13/23 0254)     ED COURSE: Patient's labs show no leukocytosis.  Normal hemoglobin.  Troponin x 2 negative.  D-dimer negative.  COVID, RSV and flu negative.  Chest x-ray reviewed and interpreted by myself and the radiologist and shows no acute abnormality.  Patient feels much better after breathing treatments.  He still has some slight wheezing but declines a third breathing treatment.  He has better aeration.  No  hypoxia at rest or with ambulation.  Have offered admission but patient states he is feeling better and would like discharge home.  He has an albuterol inhaler at home.  Will discharge with a prednisone burst.  Recommended close follow-up with his PCP.   CONSULTS: Offered admission but patient declined stating he is feeling better and would like discharge home.   OUTSIDE RECORDS REVIEWED: Reviewed last family medicine visit on 02/08/2023.       FINAL CLINICAL IMPRESSION(S) / ED DIAGNOSES   Final diagnoses:  COPD exacerbation (HCC)     Rx / DC Orders   ED Discharge Orders          Ordered    predniSONE (DELTASONE) 20 MG tablet  Daily        04/13/23 0402             Note:  This document was prepared using Dragon voice recognition software and may include unintentional dictation errors.   Dniyah Grant, Layla Maw, DO 04/13/23 [phone]

## 2023-04-13 NOTE — ED Notes (Signed)
Ambulatory pulse ox 91% at it's lowest, would rebound to 95%, heart rate 135.

## 2023-04-13 NOTE — ED Triage Notes (Signed)
Pt presents to ER with c/o diff breathing that started as a cough 2 days ago, and has been getting worse.  Pt reports that tonight, he could not get to sleep.  Pt reports hx of asthma and COPD, and has been using his albuterol inhaler at home with no relief.  Pt is otherwise A&O x4 and in NAD at this time.

## 2023-04-14 ENCOUNTER — Other Ambulatory Visit: Payer: Self-pay | Admitting: Family Medicine

## 2023-04-15 NOTE — Telephone Encounter (Signed)
Requested by interface surescripts. Future visit in 3 months.  Requested Prescriptions  Pending Prescriptions Disp Refills   hydrochlorothiazide (HYDRODIURIL) 12.5 MG tablet [Pharmacy Med Name: hydroCHLOROthiazide 12.5 MG Oral Tablet] 100 tablet 1    Sig: TAKE 1 TABLET BY MOUTH DAILY     Cardiovascular: Diuretics - Thiazide Failed - 04/14/2023 10:21 PM      Failed - Na in normal range and within 180 days    Sodium  Date Value Ref Range Status  04/13/2023 130 (L) 135 - 145 mmol/L Final  02/08/2023 136 134 - 144 mmol/L Final         Failed - Last BP in normal range    BP Readings from Last 1 Encounters:  04/13/23 (!) 145/66         Passed - Cr in normal range and within 180 days    Creatinine, Ser  Date Value Ref Range Status  04/13/2023 0.79 0.61 - 1.24 mg/dL Final         Passed - K in normal range and within 180 days    Potassium  Date Value Ref Range Status  04/13/2023 3.9 3.5 - 5.1 mmol/L Final         Passed - Valid encounter within last 6 months    Recent Outpatient Visits           2 months ago Essential hypertension   [place] [facility] Cullison, Sherran Needs, NP   9 months ago Encounter for annual wellness exam in Medicare patient   [place] [facility] Larae Grooms, NP   1 year ago Annual physical exam   [place] [facility] Larae Grooms, NP   1 year ago Essential hypertension   [place] [facility] Larae Grooms, NP   2 years ago Annual physical exam   [place] [facility] Larae Grooms, NP       Future Appointments             In 3 months Larae Grooms, NP [facility]

## 2023-04-16 ENCOUNTER — Other Ambulatory Visit: Payer: Self-pay | Admitting: Family Medicine

## 2023-04-19 NOTE — Telephone Encounter (Signed)
Patient has appointment 07/11/23- will need a RF before next appointment- will send RF to pharmacy Requested Prescriptions  Pending Prescriptions Disp Refills   lisinopril (ZESTRIL) 20 MG tablet [Pharmacy Med Name: Lisinopril 20 MG Oral Tablet] 100 tablet 2    Sig: TAKE 1 TABLET BY MOUTH DAILY     Cardiovascular:  ACE Inhibitors Failed - 04/16/2023  6:49 PM      Failed - Last BP in normal range    BP Readings from Last 1 Encounters:  04/13/23 (!) 145/66         Passed - Cr in normal range and within 180 days    Creatinine, Ser  Date Value Ref Range Status  04/13/2023 0.79 0.61 - 1.24 mg/dL Final         Passed - K in normal range and within 180 days    Potassium  Date Value Ref Range Status  04/13/2023 3.9 3.5 - 5.1 mmol/L Final         Passed - Patient is not pregnant      Passed - Valid encounter within last 6 months    Recent Outpatient Visits           2 months ago Essential hypertension   [place] [facility] Pearley, Sherran Needs, NP   9 months ago Encounter for annual wellness exam in Medicare patient   [place] [facility] Larae Grooms, NP   1 year ago Annual physical exam   [place] [facility] Larae Grooms, NP   1 year ago Essential hypertension   [place] [facility] Larae Grooms, NP   2 years ago Annual physical exam   [place] [facility] Larae Grooms, NP       Future Appointments             In 3 months Larae Grooms, NP [place] [facility]             VENTOLIN HFA 108 [id number] Base) MCG/ACT inhaler [Pharmacy Med Name: Ventolin HFA 108 (90 Base) MCG/ACT Inhalation Aerosol Solution] 36 g 6    Sig: USE 2 INHALATIONS BY MOUTH EVERY 6 HOURS AS NEEDED FOR WHEEZING  OR SHORTNESS OF BREATH     Pulmonology:  Beta Agonists 2 Failed - 04/16/2023  6:49 PM      Failed - Last BP in normal range    BP Readings from Last 1 Encounters:   04/13/23 (!) 145/66         Failed - Last Heart Rate in normal range    Pulse Readings from Last 1 Encounters:  04/13/23 (!) 117         Passed - Valid encounter within last 12 months    Recent Outpatient Visits           2 months ago Essential hypertension   [place] [facility] [place], Sherran Needs, NP   9 months ago Encounter for annual wellness exam in Medicare patient   [place] [facility] Larae Grooms, NP   1 year ago Annual physical exam   [place] [facility] Larae Grooms, NP   1 year ago Essential hypertension   [place] [facility] Larae Grooms, NP   2 years ago Annual physical exam   [place] [facility] Larae Grooms, NP       Future Appointments             In  3 months Larae Grooms, NP [place] [facility]             metoprolol succinate (TOPROL-XL) 50 MG 24 hr tablet [Pharmacy Med Name: Metoprolol Succinate ER 50 MG Oral Tablet Extended Release 24 Hour] 100 tablet 2    Sig: TAKE 1 TABLET BY MOUTH DAILY  WITH OR IMMEDIATELY FOLLOWING A  MEAL     Cardiovascular:  Beta Blockers Failed - 04/16/2023  6:49 PM      Failed - Last BP in normal range    BP Readings from Last 1 Encounters:  04/13/23 (!) 145/66         Failed - Last Heart Rate in normal range    Pulse Readings from Last 1 Encounters:  04/13/23 (!) 117         Passed - Valid encounter within last 6 months    Recent Outpatient Visits           2 months ago Essential hypertension   [place] [facility] [place], Sherran Needs, NP   9 months ago Encounter for annual wellness exam in Medicare patient   [place] [facility] Larae Grooms, NP   1 year ago Annual physical exam   [place] [facility] Larae Grooms, NP   1 year ago Essential hypertension   [place] [facility] Larae Grooms,  NP   2 years ago Annual physical exam   [place] [facility] Larae Grooms, NP       Future Appointments             In 3 months Larae Grooms, NP [place] [facility]

## 2023-05-31 ENCOUNTER — Encounter: Payer: Self-pay | Admitting: Nurse Practitioner

## 2023-06-01 ENCOUNTER — Ambulatory Visit (INDEPENDENT_AMBULATORY_CARE_PROVIDER_SITE_OTHER): Payer: Medicare Other | Admitting: Nurse Practitioner

## 2023-06-01 ENCOUNTER — Encounter: Payer: Self-pay | Admitting: Nurse Practitioner

## 2023-06-01 VITALS — BP 111/64 | HR 87 | Temp 97.4°F | Ht 70.0 in | Wt 214.6 lb

## 2023-06-01 DIAGNOSIS — I482 Chronic atrial fibrillation, unspecified: Secondary | ICD-10-CM | POA: Diagnosis not present

## 2023-06-01 DIAGNOSIS — R002 Palpitations: Secondary | ICD-10-CM | POA: Diagnosis not present

## 2023-06-01 DIAGNOSIS — I4891 Unspecified atrial fibrillation: Secondary | ICD-10-CM | POA: Insufficient documentation

## 2023-06-01 MED ORDER — APIXABAN 5 MG PO TABS
5.0000 mg | ORAL_TABLET | Freq: Two times a day (BID) | ORAL | 1 refills | Status: DC
Start: 2023-06-01 — End: 2023-07-19

## 2023-06-01 NOTE — Assessment & Plan Note (Addendum)
New problem.  EKG abnormal in office.  Altered on apple watch last Thursday and continued to have alerts throughout the weekend.  Rate stayed controlled between 70-90.  Already on metoprolol 50mg .  Will continue with current dose and start Eliquis 5mg  BID.  Patient will make an appt with Cardiology.

## 2023-06-01 NOTE — Progress Notes (Signed)
BP 111/64 (BP Location: Right Arm, Patient Position: Sitting, Cuff Size: Large)   Pulse 87   Temp (!) 97.4 F (36.3 C) (Oral)   Ht 5\' 10"  (1.778 m)   Wt 214 lb 9.6 oz (97.3 kg)   SpO2 97%   BMI 30.79 kg/m    Subjective:    Patient ID: Charles Bryant, male    DOB: Sep 15, 1955, 68 y.o.   MRN: [id number]  HPI: Charles Bryant is a 68 y.o. male  Chief Complaint  Patient presents with   Atrial Fibrillation    Has been going on since last night    Patient states his watch is saying that he is having Afib.  His rate has been between 70-90.  He is currently on Metoprolol 50mg .  Blood pressure has been well controlled at 120-130/80.  Not having any chest pain or SOB.  Symptoms started last Thursday.  Has had alerts off and on since then.       Relevant past medical, surgical, family and social history reviewed and updated as indicated. Interim medical history since our last visit reviewed. Allergies and medications reviewed and updated.  Review of Systems  Respiratory:  Negative for shortness of breath.   Cardiovascular:  Negative for chest pain.    Per HPI unless specifically indicated above     Objective:    BP 111/64 (BP Location: Right Arm, Patient Position: Sitting, Cuff Size: Large)   Pulse 87   Temp (!) 97.4 F (36.3 C) (Oral)   Ht 5\' 10"  (1.778 m)   Wt 214 lb 9.6 oz (97.3 kg)   SpO2 97%   BMI 30.79 kg/m   Wt Readings from Last 3 Encounters:  06/01/23 214 lb 9.6 oz (97.3 kg)  04/13/23 215 lb (97.5 kg)  02/08/23 217 lb 3.2 oz (98.5 kg)    Physical Exam Vitals and nursing note reviewed.  Constitutional:      General: He is not in acute distress.    Appearance: Normal appearance. He is not ill-appearing, toxic-appearing or diaphoretic.  HENT:     Head: Normocephalic.     Right Ear: External ear normal.     Left Ear: External ear normal.     Nose: Nose normal. No congestion or rhinorrhea.     Mouth/Throat:     Mouth: Mucous membranes are moist.  Eyes:     General:         Right eye: No discharge.        Left eye: No discharge.     Extraocular Movements: Extraocular movements intact.     Conjunctiva/sclera: Conjunctivae normal.     Pupils: Pupils are equal, round, and reactive to light.  Cardiovascular:     Rate and Rhythm: Normal rate. Rhythm irregular.     Heart sounds: No murmur heard. Pulmonary:     Effort: Pulmonary effort is normal. No respiratory distress.     Breath sounds: Normal breath sounds. No wheezing, rhonchi or rales.  Abdominal:     General: Abdomen is flat. Bowel sounds are normal.  Musculoskeletal:     Cervical back: Normal range of motion and neck supple.  Skin:    General: Skin is warm and dry.     Capillary Refill: Capillary refill takes less than 2 seconds.  Neurological:     General: No focal deficit present.     Mental Status: He is alert and oriented to person, place, and time.  Psychiatric:        Mood and  Affect: Mood normal.        Behavior: Behavior normal.        Thought Content: Thought content normal.        Judgment: Judgment normal.     Results for orders placed or performed during the hospital encounter of 04/13/23  Resp panel by RT-PCR (RSV, Flu A&B, Covid) Anterior Nasal Swab   Collection Time: 04/13/23  1:02 AM   Specimen: Anterior Nasal Swab  Result Value Ref Range   SARS Coronavirus 2 by RT PCR NEGATIVE NEGATIVE   Influenza A by PCR NEGATIVE NEGATIVE   Influenza B by PCR NEGATIVE NEGATIVE   Resp Syncytial Virus by PCR NEGATIVE NEGATIVE  CBC   Collection Time: 04/13/23  1:02 AM  Result Value Ref Range   WBC 10.3 4.0 - 10.5 K/uL   RBC 4.51 4.22 - 5.81 MIL/uL   Hemoglobin 14.3 13.0 - 17.0 g/dL   HCT 69.6 29.5 - 28.4 %   MCV 92.2 80.0 - 100.0 fL   MCH 31.7 26.0 - 34.0 pg   MCHC 34.4 30.0 - 36.0 g/dL   RDW 13.2 44.0 - 10.2 %   Platelets 187 150 - 400 K/uL   nRBC 0.0 0.0 - 0.2 %  Comprehensive metabolic panel   Collection Time: 04/13/23  1:02 AM  Result Value Ref Range   Sodium 130 (L) 135 -  145 mmol/L   Potassium 3.9 3.5 - 5.1 mmol/L   Chloride 95 (L) 98 - 111 mmol/L   CO2 24 22 - 32 mmol/L   Glucose, Bld 114 (H) 70 - 99 mg/dL   BUN 11 8 - 23 mg/dL   Creatinine, Ser 7.25 0.61 - 1.24 mg/dL   Calcium 9.2 8.9 - 36.6 mg/dL   Total Protein 7.2 6.5 - 8.1 g/dL   Albumin 4.0 3.5 - 5.0 g/dL   AST 28 15 - 41 U/L   ALT 36 0 - 44 U/L   Alkaline Phosphatase 106 38 - 126 U/L   Total Bilirubin 0.4 <1.2 mg/dL   GFR, Estimated >44 >03 mL/min   Anion gap 11 5 - 15  Troponin I (High Sensitivity)   Collection Time: 04/13/23  1:02 AM  Result Value Ref Range   Troponin I (High Sensitivity) 5 <18 ng/L  D-dimer, quantitative   Collection Time: 04/13/23  2:54 AM  Result Value Ref Range   D-Dimer, Quant 0.46 0.00 - 0.50 ug/mL-FEU  Troponin I (High Sensitivity)   Collection Time: 04/13/23  2:54 AM  Result Value Ref Range   Troponin I (High Sensitivity) 5 <18 ng/L      Assessment & Plan:   Problem List Items Addressed This Visit       Cardiovascular and Mediastinum   A-fib (HCC) - Primary   New problem.  EKG abnormal in office.  Altered on apple watch last Thursday and continued to have alerts throughout the weekend.  Rate stayed controlled between 70-90.  Already on metoprolol 50mg .  Will continue with current dose and start Eliquis 5mg  BID.  Patient will make an appt with Cardiology.        Relevant Medications   apixaban (ELIQUIS) 5 MG TABS tablet   Other Visit Diagnoses       Palpitations       Relevant Orders   EKG 12-Lead        Follow up plan: No follow-ups on file.

## 2023-06-02 NOTE — Progress Notes (Signed)
Cardiology Office Note:  .   Date:  06/03/2023  ID:  Charles Bryant, DOB 04/22/1956, MRN [id number] PCP: Larae Grooms, NP  [facility] Providers Cardiologist:  Nanetta Batty, MD  History of Present Illness: Charles Bryant   Charles Bryant is a 68 y.o. male with a past medical history of HLD, HTN, tobacco use, stenosis of right carotid artery, COPD. He is followed by Dr. Allyson Sabal and presents today for evaluation of newly diagnosed atrial fibrillation   Patient previously lived in [place], and moved to [place] in 09/2018. When he lived in [place], he had a right carotid endarterectomy for asymptomatic carotid disease in 2008. He stopped smoking in 2020. Established care with Dr. Allyson Sabal in 01/2022. Underwent carotid ultrasounds on 02/01/22 that showed 1-39% stenosis in the right and left ICAs. Recommended repeat ultrasounds in 1 year. Patient also underwent CT cardiac scoring on 02/19/22 that showed a coronary calcium score of 317 (68th percentile). His atorvastatin was increased to 40 mg daily. Patient has not been seen by cardiology since that time.   Patient was seen in the ED on 04/13/23 for evaluation of shortness of breath and wheezing. hsTn negative x2. COVID, flu, RSV negative. CXR showed no active cardiopulmonary disease. EKG showed sinus tachycardia with multiple PACs, HR 116 BPM. Patient was given breathing treatment with improvement. He was discharged from the ED.   He was seen by his PCP on 1/22 for evaluation of irregular heart rates. His apple watch had been alerting him for atrial fibrillation over the weekend with HR between the 70s-90s. He was already on metoprolol 50 mg daily. His PCP started eliquis 5 mg BID   Today, patient presents with Afib after it was detected by his Apple watch. Reports that he had one prior episode of afib about a year ago, but he converted back to NSR within 24 hours. He was not started on [facility] at that time as the episode was short and he did not have recurrence. Earlier this week, his  apple watch again alerted for afib. His wife is an NP with cardiology experience, and she reviewed the strips on his phone to confirm. Patient denies precipitating events. He did note some ankle swelling earlier this week, but this resolved with increased dose of hydrochlorothiazide. EKG at his PCP office yesterday confirmed afib and he was started on eliquis. He is minimally symptomatic when in afib, and reports occasional mild fluttering in his chest. He denies significant dyspnea, despite a history of COPD and asthma.  Denies any chest pain, syncope, near syncope.  The patient's heart rate and blood pressure have remained well-controlled throughout the current episode of AFib  The patient has a history of loud snoring, suggestive of possible sleep apnea, which is currently undiagnosed and untreated. He has reduced his caffeine intake to one cup per day.  ROS: Denies chest pain, syncope, near syncope, shortness of breath. Does have occasional fluttering in chest and mild ankle edema   Studies Reviewed: .   Cardiac Studies & Procedures         CT SCANS  CT CARDIAC SCORING (SELF PAY ONLY) 02/19/2022  Addendum 02/22/2022 12:14 PM ADDENDUM REPORT: 02/22/2022 12:12  EXAM: OVER-READ INTERPRETATION  CT CHEST  The following report is an over-read performed by radiologist Dr. Maryelizabeth Rowan [facility] on 02/22/2022. This over-read does not include interpretation of cardiac or coronary anatomy or pathology. The calcium score interpretation by the cardiologist is attached.  COMPARISON:  None.  FINDINGS: Limited view of  the lung parenchyma demonstrates no suspicious nodularity. Airways are normal.  Limited view of the mediastinum demonstrates no adenopathy. Esophagus normal.  Limited view of the upper abdomen unremarkable.  Limited view of the skeleton and chest wall is unremarkable.  IMPRESSION: No significant extracardiac findings.   Electronically Signed By:  Genevive Bi M.D. On: 02/22/2022 12:12  Narrative CLINICAL DATA:  Cardiovascular disease risk stratification  CAD screening, intermediate CAD risk, not treadmill candidate  EXAM: CT Coronary Calcium Score  TECHNIQUE: A gated, non-contrast computed tomography scan of the heart was performed using 3mm slice thickness. Axial images were analyzed on a dedicated workstation. Calcium scoring of the coronary arteries was performed using the Agatston method.  FINDINGS: Coronary Calcium Score:  Left main: 83, challenging to distinguish from aortic root calcifications due to slice thickness and noncontrast exam. May be overestimated.  Left anterior descending artery: 198  Left circumflex artery: 0  Right coronary artery: 36.2  Total: 317  Percentile: 68th  Pericardium: Normal.  Ascending Aorta: Normal caliber. Ascending aorta measures approximately 35mm at the mid ascending aorta measured in an axial plane.  Non-cardiac: See separate report from [facility].  IMPRESSION: Coronary calcium score of 317. This was 68th percentile for age-, race-, and sex-matched controls.  RECOMMENDATIONS: Coronary artery calcium (CAC) score is a strong predictor of incident coronary heart disease (CHD) and provides predictive information beyond traditional risk factors. CAC scoring is reasonable to use in the decision to withhold, postpone, or initiate statin therapy in intermediate-risk or selected borderline-risk asymptomatic adults (age 93-75 years and LDL-C >=70 to <190 mg/dL) who do not have diabetes or established atherosclerotic cardiovascular disease (ASCVD).* In intermediate-risk (10-year ASCVD risk >=7.5% to <20%) adults or selected borderline-risk (10-year ASCVD risk >=5% to <7.5%) adults in whom a CAC score is measured for the purpose of making a treatment decision the following recommendations have been made:  If CAC=0, it is reasonable to withhold statin  therapy and reassess in 5 to 10 years, as long as higher risk conditions are absent (diabetes mellitus, family history of premature CHD in first degree relatives (males <55 years; females <65 years), cigarette smoking, or LDL >=190 mg/dL).  If CAC is 1 to 99, it is reasonable to initiate statin therapy for patients >=37 years of age.  If CAC is >=100 or >=75th percentile, it is reasonable to initiate statin therapy at any age.  Cardiology referral should be considered for patients with CAC scores >=400 or >=75th percentile.  *2018 AHA/ACC/AACVPR/AAPA/ABC/ACPM/ADA/AGS/APhA/ASPC/NLA/PCNA Guideline on the Management of Blood Cholesterol: A Report of the American College of Cardiology/American Heart Association Task Force on Clinical Practice Guidelines. J Am Coll Cardiol. 2019;73(24):3168-3209.  Electronically Signed: By: Weston Brass M.D. On: 02/20/2022 14:57          Risk Assessment/Calculations:    CHA2DS2-VASc Score = 3  This indicates a 3.2% annual risk of stroke. The patient's score is based upon: CHF History: 0 HTN History: 1 Diabetes History: 0 Stroke History: 0 Vascular Disease History: 1 Age Score: 1 Gender Score: 0    Physical Exam:   VS:  BP 112/68 (BP Location: Left Arm, Patient Position: Sitting)   Pulse 81   Ht 5\' 10"  (1.778 m)   Wt 217 lb 3.2 oz (98.5 kg)   SpO2 100%   BMI 31.16 kg/m    Wt Readings from Last 3 Encounters:  06/03/23 217 lb 3.2 oz (98.5 kg)  06/01/23 214 lb 9.6 oz (97.3 kg)  04/13/23 215 lb (97.5  kg)    GEN: Well nourished, well developed in no acute distress. Sitting comfortably on the exam table  NECK: No JVD; No carotid bruits CARDIAC: Irregular rate and rhythm, no murmurs, rubs, gallops RESPIRATORY:  Distant breath sounds, but clear to auscultation without rales, wheezing or rhonchi. Normal WOB on room air  ABDOMEN: Soft, non-tender, non-distended EXTREMITIES:  Trace edema in BLE; No deformity   ASSESSMENT AND PLAN: .     Paroxysmal Atrial Fibrillation  - Patient reports 1 episode of atrial fibrillation in the past- occurred a year ago and lasted 24 hours. Has not had recurrence until now. Last week, his apple watch alerted he was in afib. Has been minimally symptomatic with mild palpitations. No chest pain, SOB.  - Patient saw his PCP yesterday where EKG reportedly showed atrial fibrillation (EKG not on epic). PCP started eliquis 5 mg BID for stroke prophylaxis. Patient already on metoprolol succinate 50 mg daily  - EKG today showed Atrial fibrillation with HR 81 BPM - Patient wears an apple watch- reports that since being notified of atrial fibrillation, his HR has been in the 70s-100s  - Continue eliquis, metoprolol. Stressed importance of eliquis compliance  - Stop aspirin with eliquis start  - Ordered echocardiogram   - Ordered BMP, mag, CBC, TSH, free T4 - Arranged appointment in 3 weeks- if still in afib at that time, plan for outpatient cardioversion  - Discussed triggers for afib including sleep apnea, alcohol use, caffeine, respiratory infections. Patient does snore and his wife has noticed him stop breathing in his sleep. Discussed sleep study, patient would prefer to wait   Elevated coronary calcium score  HLD  - CT cardiac scoring from 02/2022 showed coronary calcium score of 317 (68th percentile)  - Patient denies chest pain  - Lipid panel from 02/2023 showed LDL 67  - Continue lipitor 40 mg daily   HTN  - Blood pressure well controlled on current medications  - Continue amlodipine 10 mg daily, hydrochlorothiazide 12.5 mg daily, lisinopril 20 mg daily, metoprolol succinate 50 mg daily   Carotid Artery disease  - Previously underwent R carotid endarterectomy in 2008 - Carotid ultrasounds from 01/2022 showed 1-39% stenosis in bilateral ICAs - No carotid bruits on exam today  - Continue lipitor 40 mg daily. Instructed patient to stop aspirin with eliquis start    Dispo: Follow up in 3 weeks  with APP   Signed, Jonita Albee, PA-C

## 2023-06-03 ENCOUNTER — Ambulatory Visit: Payer: Medicare Other | Attending: Cardiology | Admitting: Cardiology

## 2023-06-03 ENCOUNTER — Encounter: Payer: Self-pay | Admitting: Cardiology

## 2023-06-03 VITALS — BP 112/68 | HR 81 | Ht 70.0 in | Wt 217.2 lb

## 2023-06-03 DIAGNOSIS — I10 Essential (primary) hypertension: Secondary | ICD-10-CM

## 2023-06-03 DIAGNOSIS — I480 Paroxysmal atrial fibrillation: Secondary | ICD-10-CM

## 2023-06-03 DIAGNOSIS — I6521 Occlusion and stenosis of right carotid artery: Secondary | ICD-10-CM | POA: Diagnosis not present

## 2023-06-03 DIAGNOSIS — R931 Abnormal findings on diagnostic imaging of heart and coronary circulation: Secondary | ICD-10-CM

## 2023-06-03 DIAGNOSIS — E782 Mixed hyperlipidemia: Secondary | ICD-10-CM | POA: Diagnosis not present

## 2023-06-03 NOTE — Patient Instructions (Addendum)
Medication Instructions:  Stop Aspirin *If you need a refill on your cardiac medications before your next appointment, please call your pharmacy*   Lab Work: BMET, CBC,Magnesium Level,TSH, Free T4 If you have labs (blood work) drawn today and your tests are completely normal, you will receive your results only by: MyChart Message (if you have MyChart) OR A paper copy in the mail If you have any lab test that is abnormal or we need to change your treatment, we will call you to review the results.   Testing/Procedures: [address]. Your physician has requested that you have an echocardiogram. Echocardiography is a painless test that uses sound waves to create images of your heart. It provides your doctor with information about the size and shape of your heart and how well your heart's chambers and valves are working. This procedure takes approximately one hour. There are no restrictions for this procedure. Please do NOT wear cologne, perfume, aftershave, or lotions (deodorant is allowed). Please arrive 15 minutes prior to your appointment time.  Please note: We ask at that you not bring children with you during ultrasound (echo/ vascular) testing. Due to room size and safety concerns, children are not allowed in the ultrasound rooms during exams. Our front office staff cannot provide observation of children in our lobby area while testing is being conducted. An adult accompanying a patient to their appointment will only be allowed in the ultrasound room at the discretion of the ultrasound technician under special circumstances. We apologize for any inconvenience.    Follow-Up: At [facility], you and your health needs are our priority.  As part of our continuing mission to provide you with exceptional heart care, we have created designated Provider Care Teams.  These Care Teams include your primary Cardiologist (physician) and Advanced Practice Providers (APPs -   Physician Assistants and Nurse Practitioners) who all work together to provide you with the care you need, when you need it.  We recommend signing up for the patient portal called "MyChart".  Sign up information is provided on this After Visit Summary.  MyChart is used to connect with patients for Virtual Visits (Telemedicine).  Patients are able to view lab/test results, encounter notes, upcoming appointments, etc.  Non-urgent messages can be sent to your provider as well.   To learn more about what you can do with MyChart, go to [email or web address].    Your next appointment:   3 week(s)  Provider:   Robet Leu, PA-C    or, Joni Reining , DNP

## 2023-06-04 LAB — BASIC METABOLIC PANEL
BUN/Creatinine Ratio: 21 (ref 10–24)
BUN: 17 mg/dL (ref 8–27)
CO2: 24 mmol/L (ref 20–29)
Calcium: 9.6 mg/dL (ref 8.6–10.2)
Chloride: 96 mmol/L (ref 96–106)
Creatinine, Ser: 0.81 mg/dL (ref 0.76–1.27)
Glucose: 114 mg/dL — ABNORMAL HIGH (ref 70–99)
Potassium: 4.6 mmol/L (ref 3.5–5.2)
Sodium: 136 mmol/L (ref 134–144)
eGFR: 95 mL/min/{1.73_m2} (ref >59)

## 2023-06-04 LAB — CBC
Hematocrit: 38.9 % (ref 37.5–51.0)
Hemoglobin: 13.1 g/dL (ref 13.0–17.7)
MCH: 31.4 pg (ref 26.6–33.0)
MCHC: 33.7 g/dL (ref 31.5–35.7)
MCV: 93 fL (ref 79–97)
Platelets: 233 10*3/uL (ref 150–450)
RBC: 4.17 x10E6/uL (ref 4.14–5.80)
RDW: 13 % (ref 11.6–15.4)
WBC: 10 10*3/uL (ref 3.4–10.8)

## 2023-06-04 LAB — T4, FREE: Free T4: 1.29 ng/dL (ref 0.82–1.77)

## 2023-06-04 LAB — MAGNESIUM: Magnesium: 1.7 mg/dL (ref 1.6–2.3)

## 2023-06-04 LAB — TSH: TSH: 3.56 u[IU]/mL (ref 0.450–4.500)

## 2023-06-27 ENCOUNTER — Ambulatory Visit (HOSPITAL_COMMUNITY): Payer: Medicare Other | Attending: Cardiology

## 2023-06-27 DIAGNOSIS — I480 Paroxysmal atrial fibrillation: Secondary | ICD-10-CM

## 2023-06-27 LAB — ECHOCARDIOGRAM COMPLETE: S' Lateral: 2.5 cm

## 2023-06-27 MED ORDER — PERFLUTREN LIPID MICROSPHERE
1.0000 mL | INTRAVENOUS | Status: AC | PRN
  Administered 2023-06-27: 2 mL via INTRAVENOUS

## 2023-06-28 ENCOUNTER — Telehealth: Payer: Self-pay

## 2023-06-28 NOTE — Telephone Encounter (Signed)
 Left message to call back

## 2023-06-28 NOTE — Telephone Encounter (Signed)
-----   Message from Jonita Albee sent at 06/27/2023  1:39 PM EST ----- Please tell patient that his recent echocardiogram showed EF (measurement of pumping function of heart) is normal at 60-65%.  LV functioning normally and there were no wall motion abnormalities.  The RV is functioning normally.  There were no significant valvular abnormalities noted.  Overall, good result! No changes to treatment plan at this time

## 2023-06-30 NOTE — Telephone Encounter (Signed)
pt aware of results  

## 2023-06-30 NOTE — Telephone Encounter (Signed)
 Patient was returning call. Please advise ?

## 2023-06-30 NOTE — Telephone Encounter (Signed)
 Left message to call back

## 2023-07-02 NOTE — H&P (View-Only) (Signed)
 Cardiology Office Note    Date:  07/08/2023  ID:  Cephas, Revard [birth year], MRN [id number] PCP:  Larae Grooms, NP  Cardiologist:  Nanetta Batty, MD  Electrophysiologist:  None   Chief Complaint: Atrial fibrillation   History of Present Illness: Charles Bryant    Gal Feldhaus is a 68 y.o. male with visit-pertinent history of hyperlipidemia, hypertension, tobacco use, stenosis of right carotid artery, COPD.  In 09/2018 patient moved to [place] from [place].  While living in [place] he had a right carotid endarterectomy for asymptomatic carotid disease in 2008.  He stopped smoking in 2020.  He established care with Dr. Allyson Sabal in 01/2022.  Underwent carotid ultrasounds on 02/01/2022 that showed 1 to 39% stenosis in the right and left ICAs.  Recommended repeat ultrasounds in 1 year.  He underwent CT cardiac scoring on 02/19/2022 that showed a coronary calcium score of 317, 68 percentile.  His atorvastatin was increased to 40 mg daily.  On 04/13/2023 patient was seen in the ED for evaluation of shortness of breath and wheezing.  High sensitive troponins negative x 2.  COVID, flu and RSV were negative.  Chest x-ray showed no active cardiopulmonary disease.  EKG showed sinus tachycardia with multiple PACs, heart rate 116 bpm.  Patient was given breathing treatment with improvement and was discharged.  He was seen by his PCP on 1/22 for evaluation of irregular heart rates, his Apple Watch had been alerting him for atrial fibrillation over the weekend with heart rate between the 70s and 90s.  Patient was on metoprolol 50 mg daily, his PCP started him on Eliquis 5 mg twice daily.  Patient was seen in cardiology clinic on 06/03/2023.  Patient reported that he had 1 prior episode of A-fib a year prior but converted back to normal sinus rhythm within 24 hours.  He was not started on anticoagulation at that time as episode was short he did not have recurrence.  At office visit patient noted an episode earlier that week.   Patient also noted some increased ankle swelling that resolved with increased dose of hydrochlorothiazide.  It was noted patient has history of loud snoring, suggestive of possible sleep apnea.  Echocardiogram was ordered and patient arranged for 3-week follow-up with plan if still in A-fib at that time for outpatient cardioversion.  Echocardiogram on 06/27/2023 indicated LVEF of 60 to 65%, no RWMA, diastolic function could not be evaluated, RV systolic function and size was normal, no significant valvular abnormalities.  Today he presents for follow-up.  He reports that he has been doing well.  He denies chest pain, shortness of breath, lower extremity edema, orthopnea or PND.  He continues to monitor his atrial fibrillation with his Apple Watch, notes that we will alert him if his heart rate becomes slightly elevated.  He denies any palpitations or feelings of atrial fibrillation.  He reports compliance with all of his medications including Eliquis, he denies any bleeding problems.  Patient notes that he did have some swelling to bilateral ankles a few weeks ago that resolved.  ROS: .   Today he denies chest pain, shortness of breath, lower extremity edema, fatigue, palpitations, melena, hematuria, hemoptysis, diaphoresis, weakness, presyncope, syncope, orthopnea, and PND.  All other systems are reviewed and otherwise negative. Studies Reviewed: Charles Bryant    EKG:  EKG is ordered today, personally reviewed, demonstrating  EKG Interpretation Date/Time:  Friday July 08 2023 15:49:18 EST Ventricular Rate:  88 PR Interval:    QRS Duration:  92  QT Interval:  324 QTC Calculation: 392 R Axis:   44  Text Interpretation: Atrial fibrillation Incomplete right bundle branch block Septal infarct (cited on or before 03-Jun-2023) No significant change from prior Confirmed by Reather Littler [phone]) on 07/08/2023 4:26:47 PM   CV Studies:  Cardiac Studies & Procedures    ______________________________________________________________________________________________     ECHOCARDIOGRAM  ECHOCARDIOGRAM COMPLETE 06/27/2023  Narrative ECHOCARDIOGRAM REPORT    Patient Name:   Charles Bryant     Date of Exam: 06/27/2023 Medical Rec #:  [id number]     Height:       70.0 in Accession #:    [id number]    Weight:       217.2 lb Date of Birth:  1955-09-18     BSA:          2.162 m Patient Age:    67 years      BP:           112/68 mmHg Patient Gender: M             HR:           73 bpm. Exam Location:  [address]  Procedure: 2D Echo, Cardiac Doppler, Color Doppler and Intracardiac Opacification Agent (Both Spectral and Color Flow Doppler were utilized during procedure).  Indications:    8.91 Atrial Fibrillation  History:        Patient has no prior history of Echocardiogram examinations. COPD; Risk Factors:Hypertension and Dyslipidemia.  Sonographer:    Daphine Deutscher RDCS Referring Phys: [id number] Jonita Albee  IMPRESSIONS   1. Left ventricular ejection fraction, by estimation, is 60 to 65%. The left ventricle has normal function. The left ventricle has no regional wall motion abnormalities. Left ventricular diastolic function could not be evaluated. 2. Right ventricular systolic function is normal. The right ventricular size is normal. 3. The mitral valve is normal in structure. No evidence of mitral valve regurgitation. No evidence of mitral stenosis. 4. The aortic valve is normal in structure. Aortic valve regurgitation is not visualized. No aortic stenosis is present. 5. The inferior vena cava is normal in size with greater than 50% respiratory variability, suggesting right atrial pressure of 3 mmHg.  FINDINGS Left Ventricle: Left ventricular ejection fraction, by estimation, is 60 to 65%. The left ventricle has normal function. The left ventricle has no regional wall motion abnormalities. Definity contrast agent was given IV to delineate  the left ventricular endocardial borders. The left ventricular internal cavity size was normal in size. There is no left ventricular hypertrophy. Left ventricular diastolic function could not be evaluated due to atrial fibrillation. Left ventricular diastolic function could not be evaluated.  Right Ventricle: The right ventricular size is normal. No increase in right ventricular wall thickness. Right ventricular systolic function is normal.  Left Atrium: Left atrial size was normal in size.  Right Atrium: Right atrial size was normal in size.  Pericardium: There is no evidence of pericardial effusion.  Mitral Valve: The mitral valve is normal in structure. No evidence of mitral valve regurgitation. No evidence of mitral valve stenosis.  Tricuspid Valve: The tricuspid valve is normal in structure. Tricuspid valve regurgitation is mild . No evidence of tricuspid stenosis.  Aortic Valve: The aortic valve is normal in structure. Aortic valve regurgitation is not visualized. No aortic stenosis is present.  Pulmonic Valve: The pulmonic valve was normal in structure. Pulmonic valve regurgitation is mild. No evidence of pulmonic stenosis.  Aorta: The aortic root is normal in  size and structure.  Venous: The inferior vena cava is normal in size with greater than 50% respiratory variability, suggesting right atrial pressure of 3 mmHg.  IAS/Shunts: No atrial level shunt detected by color flow Doppler.  Additional Comments: 3D imaging was not performed.   LEFT VENTRICLE PLAX 2D LVIDd:         4.60 cm LVIDs:         2.50 cm LV PW:         0.70 cm LV IVS:        0.70 cm LVOT diam:     2.00 cm LV SV:         49 LV SV Index:   23 LVOT Area:     3.14 cm   RIGHT VENTRICLE             IVC RV Basal diam:  4.10 cm     IVC diam: 1.80 cm RV S prime:     14.67 cm/s TAPSE (M-mode): 3.3 cm  LEFT ATRIUM             Index        RIGHT ATRIUM           Index LA diam:        4.20 cm 1.94 cm/m   RA  Area:     17.60 cm LA Vol (A2C):   40.1 ml 18.55 ml/m  RA Volume:   54.60 ml  25.26 ml/m LA Vol (A4C):   39.3 ml 18.18 ml/m LA Biplane Vol: 40.0 ml 18.50 ml/m AORTIC VALVE LVOT Vmax:   92.87 cm/s LVOT Vmean:  60.600 cm/s LVOT VTI:    0.157 m  AORTA Ao Root diam: 3.80 cm Ao Asc diam:  3.40 cm  MV E velocity: 111.00 cm/s  TRICUSPID VALVE TR Peak grad:   32.5 mmHg TR Vmax:        285.00 cm/s  SHUNTS Systemic VTI:  0.16 m Systemic Diam: 2.00 cm  Clearnce Hasten Electronically signed by Clearnce Hasten Signature Date/Time: 06/27/2023/12:56:46 PM    Final      CT SCANS  CT CARDIAC SCORING (SELF PAY ONLY) 02/19/2022  Addendum 02/22/2022 12:14 PM ADDENDUM REPORT: 02/22/2022 12:12  EXAM: OVER-READ INTERPRETATION  CT CHEST  The following report is an over-read performed by radiologist Dr. Maryelizabeth Rowan [facility] on 02/22/2022. This over-read does not include interpretation of cardiac or coronary anatomy or pathology. The calcium score interpretation by the cardiologist is attached.  COMPARISON:  None.  FINDINGS: Limited view of the lung parenchyma demonstrates no suspicious nodularity. Airways are normal.  Limited view of the mediastinum demonstrates no adenopathy. Esophagus normal.  Limited view of the upper abdomen unremarkable.  Limited view of the skeleton and chest wall is unremarkable.  IMPRESSION: No significant extracardiac findings.   Electronically Signed By: Genevive Bi M.D. On: 02/22/2022 12:12  Narrative CLINICAL DATA:  Cardiovascular disease risk stratification  CAD screening, intermediate CAD risk, not treadmill candidate  EXAM: CT Coronary Calcium Score  TECHNIQUE: A gated, non-contrast computed tomography scan of the heart was performed using 3mm slice thickness. Axial images were analyzed on a dedicated workstation. Calcium scoring of the coronary arteries was performed using the Agatston  method.  FINDINGS: Coronary Calcium Score:  Left main: 83, challenging to distinguish from aortic root calcifications due to slice thickness and noncontrast exam. May be overestimated.  Left anterior descending artery: 198  Left circumflex artery: 0  Right coronary artery: 36.2  Total: 317  Percentile: 68th  Pericardium: Normal.  Ascending Aorta: Normal caliber. Ascending aorta measures approximately 35mm at the mid ascending aorta measured in an axial plane.  Non-cardiac: See separate report from [facility].  IMPRESSION: Coronary calcium score of 317. This was 68th percentile for age-, race-, and sex-matched controls.  RECOMMENDATIONS: Coronary artery calcium (CAC) score is a strong predictor of incident coronary heart disease (CHD) and provides predictive information beyond traditional risk factors. CAC scoring is reasonable to use in the decision to withhold, postpone, or initiate statin therapy in intermediate-risk or selected borderline-risk asymptomatic adults (age 5-75 years and LDL-C >=70 to <190 mg/dL) who do not have diabetes or established atherosclerotic cardiovascular disease (ASCVD).* In intermediate-risk (10-year ASCVD risk >=7.5% to <20%) adults or selected borderline-risk (10-year ASCVD risk >=5% to <7.5%) adults in whom a CAC score is measured for the purpose of making a treatment decision the following recommendations have been made:  If CAC=0, it is reasonable to withhold statin therapy and reassess in 5 to 10 years, as long as higher risk conditions are absent (diabetes mellitus, family history of premature CHD in first degree relatives (males <55 years; females <65 years), cigarette smoking, or LDL >=190 mg/dL).  If CAC is 1 to 99, it is reasonable to initiate statin therapy for patients >=26 years of age.  If CAC is >=100 or >=75th percentile, it is reasonable to initiate statin therapy at any age.  Cardiology referral should  be considered for patients with CAC scores >=400 or >=75th percentile.  *2018 AHA/ACC/AACVPR/AAPA/ABC/ACPM/ADA/AGS/APhA/ASPC/NLA/PCNA Guideline on the Management of Blood Cholesterol: A Report of the American College of Cardiology/American Heart Association Task Force on Clinical Practice Guidelines. J Am Coll Cardiol. 2019;73(24):3168-3209.  Electronically Signed: By: Weston Brass M.D. On: 02/20/2022 14:57     ______________________________________________________________________________________________       Current Reported Medications:.    Current Meds  Medication Sig   amLODipine (NORVASC) 10 MG tablet Take 1 tablet (10 mg total) by mouth daily.   apixaban (ELIQUIS) 5 MG TABS tablet Take 1 tablet (5 mg total) by mouth 2 (two) times daily.   atorvastatin (LIPITOR) 40 MG tablet TAKE 1 TABLET BY MOUTH DAILY   fluticasone-salmeterol (WIXELA INHUB) 500-50 MCG/ACT AEPB USE 1 INHALATION BY MOUTH  TWICE DAILY   hydrochlorothiazide (HYDRODIURIL) 12.5 MG tablet TAKE 1 TABLET BY MOUTH DAILY   ipratropium-albuterol (DUONEB) 0.5-2.5 (3) MG/3ML SOLN Take 3 mLs by nebulization every 6 (six) hours as needed.   lisinopril (ZESTRIL) 20 MG tablet Take 1 tablet (20 mg total) by mouth daily.   metoprolol succinate (TOPROL-XL) 50 MG 24 hr tablet TAKE 1 TABLET BY MOUTH DAILY  WITH OR IMMEDIATELY FOLLOWING A  MEAL   omeprazole (PRILOSEC) 40 MG capsule Take 1 capsule (40 mg total) by mouth daily.   tadalafil (CIALIS) 20 MG tablet Take 1 tablet (20 mg total) by mouth daily as needed for erectile dysfunction.   VENTOLIN HFA 108 (90 Base) MCG/ACT inhaler USE 2 INHALATIONS BY MOUTH EVERY 6 HOURS AS NEEDED FOR WHEEZING  OR SHORTNESS OF BREATH   [DISCONTINUED] predniSONE (DELTASONE) 20 MG tablet Take 3 tablets (60 mg total) by mouth daily.   Physical Exam:    VS:  BP 118/60   Pulse 95   Ht 5\' 10"  (1.778 m)   Wt 219 lb (99.3 kg)   SpO2 96%   BMI 31.42 kg/m    Wt Readings from Last 3 Encounters:   07/08/23 219 lb (99.3 kg)  06/03/23 217 lb 3.2  oz (98.5 kg)  06/01/23 214 lb 9.6 oz (97.3 kg)    GEN: Well nourished, well developed in no acute distress NECK: No JVD; No carotid bruits CARDIAC: Irregular RR, no murmurs, rubs, gallops RESPIRATORY:  Clear to auscultation without rales, wheezing or rhonchi  ABDOMEN: Soft, non-tender, non-distended EXTREMITIES:  No edema; No acute deformity   Asessement and Plan:.    Persistent atrial fibrillation: Patient with one reported episode of atrial fibrillation a year ago lasting 24 hours.  On 05/30/2023 he was seen in office for atrial fibrillation noted on his Apple Watch.  He denies any palpitations today, overall feels he is doing well. EKG today indicates atrial fibrillation at 88 bpm.  Patient reports adherence with all medications, he denies missing any doses of Eliquis. Reviewed with Dr. Rennis Golden, DOD at [facility], he agreed with plan for cardioversion. Discussed recommendation for cardioversion with patient and his wife.  Patient agrees to proceed with cardioversion, please see consent below. Check CBC and BMET.  Continue Eliquis 5 mg twice daily and metoprolol succinate 50 mg daily. CHA2DS2-VASc Score = 3 [CHF History: 0, HTN History: 1, Diabetes History: 0, Stroke History: 0, Vascular Disease History: 1, Age Score: 1, Gender Score: 0].  Therefore, the patient's annual risk of stroke is 3.2 %.     Informed Consent   Shared Decision Making/Informed Consent The risks (stroke, cardiac arrhythmias rarely resulting in the need for a temporary or permanent pacemaker, skin irritation or burns and complications associated with conscious sedation including aspiration, arrhythmia, respiratory failure and death), benefits (restoration of normal sinus rhythm) and alternatives of a direct current cardioversion were explained in detail to Mr. Mcsorley and he agrees to proceed.       Elevated coronary calcium score: CT cardiac scoring from 10/23 indicated coronary  calcium score of 317, 68 percentile. Today patient denies chest pain or shortness of breath. Continue Lipitor 40 mg daily  Hypertension: Blood pressure today 118/60. Continue amlodipine 10 mg daily, hydrochlorothiazide 12.5 mg daily, lisinopril 20 mg daily and metoprolol succinate 50 mg daily.   Carotid artery disease: Previously underwent right carotid endarterectomy in 2008.  Carotid ultrasounds from 01/2022 showed 1 to 39% stenosis in bilateral ICAs.  Please note appreciated on exam today . Continue Lipitor 40 mg daily.  Will plan to order carotid duplex on follow-up.   Disposition: F/u with Reather Littler, NP in three weeks.   Signed, Rip Harbour, NP

## 2023-07-02 NOTE — Progress Notes (Unsigned)
 Cardiology Office Note    Date:  07/08/2023  ID:  Charles Bryant 22, 1957, MRN [id number] PCP:  Larae Grooms, NP  Cardiologist:  Nanetta Batty, MD  Electrophysiologist:  None   Chief Complaint: Atrial fibrillation   History of Present Illness: [name]    Charles Bryant is a 68 y.o. male with visit-pertinent history of hyperlipidemia, hypertension, tobacco use, stenosis of right carotid artery, COPD.  In 09/2018 patient moved to [place] from [place].  While living in [place] he had a right carotid endarterectomy for asymptomatic carotid disease in 2008.  He stopped smoking in 2020.  He established care with Dr. Allyson Sabal in 01/2022.  Underwent carotid ultrasounds on 02/01/2022 that showed 1 to 39% stenosis in the right and left ICAs.  Recommended repeat ultrasounds in 1 year.  He underwent CT cardiac scoring on 02/19/2022 that showed a coronary calcium score of 317, 68 percentile.  His atorvastatin was increased to 40 mg daily.  On 04/13/2023 patient was seen in the ED for evaluation of shortness of breath and wheezing.  High sensitive troponins negative x 2.  COVID, flu and RSV were negative.  Chest x-ray showed no active cardiopulmonary disease.  EKG showed sinus tachycardia with multiple PACs, heart rate 116 bpm.  Patient was given breathing treatment with improvement and was discharged.  He was seen by his PCP on 1/22 for evaluation of irregular heart rates, his Apple Watch had been alerting him for atrial fibrillation over the weekend with heart rate between the 70s and 90s.  Patient was on metoprolol 50 mg daily, his PCP started him on Eliquis 5 mg twice daily.  Patient was seen in cardiology clinic on 06/03/2023.  Patient reported that he had 1 prior episode of A-fib a year prior but converted back to normal sinus rhythm within 24 hours.  He was not started on anticoagulation at that time as episode was short he did not have recurrence.  At office visit patient noted an episode earlier that week.   Patient also noted some increased ankle swelling that resolved with increased dose of hydrochlorothiazide.  It was noted patient has history of loud snoring, suggestive of possible sleep apnea.  Echocardiogram was ordered and patient arranged for 3-week follow-up with plan if still in A-fib at that time for outpatient cardioversion.  Echocardiogram on 06/27/2023 indicated LVEF of 60 to 65%, no RWMA, diastolic function could not be evaluated, RV systolic function and size was normal, no significant valvular abnormalities.  Today he presents for follow-up.  He reports that he has been doing well.  He denies chest pain, shortness of breath, lower extremity edema, orthopnea or PND.  He continues to monitor his atrial fibrillation with his Apple Watch, notes that we will alert him if his heart rate becomes slightly elevated.  He denies any palpitations or feelings of atrial fibrillation.  He reports compliance with all of his medications including Eliquis, he denies any bleeding problems.  Patient notes that he did have some swelling to bilateral ankles a few weeks ago that resolved.  ROS: .   Today he denies chest pain, shortness of breath, lower extremity edema, fatigue, palpitations, melena, hematuria, hemoptysis, diaphoresis, weakness, presyncope, syncope, orthopnea, and PND.  All other systems are reviewed and otherwise negative. Studies Reviewed: [name]    EKG:  EKG is ordered today, personally reviewed, demonstrating  EKG Interpretation Date/Time:  Friday July 08 2023 15:49:18 EST Ventricular Rate:  88 PR Interval:    QRS Duration:  92  QT Interval:  324 QTC Calculation: 392 R Axis:   44  Text Interpretation: Atrial fibrillation Incomplete right bundle branch block Septal infarct (cited on or before 03-Jun-2023) No significant change from prior Confirmed by Reather Littler [phone]) on 07/08/2023 4:26:47 PM   CV Studies:  Cardiac Studies & Procedures    ______________________________________________________________________________________________     ECHOCARDIOGRAM  ECHOCARDIOGRAM COMPLETE 06/27/2023  Narrative ECHOCARDIOGRAM REPORT    Patient Name:   Charles Bryant     Date of Exam: 06/27/2023 Medical Rec #:  [id number]     Height:       70.0 in Accession #:    [id number]    Weight:       217.2 lb Date of Birth:  January 03, 1956     BSA:          2.162 m Patient Age:    67 years      BP:           112/68 mmHg Patient Gender: M             HR:           73 bpm. Exam Location:  [address]  Procedure: 2D Echo, Cardiac Doppler, Color Doppler and Intracardiac Opacification Agent (Both Spectral and Color Flow Doppler were utilized during procedure).  Indications:    8.91 Atrial Fibrillation  History:        Patient has no prior history of Echocardiogram examinations. COPD; Risk Factors:Hypertension and Dyslipidemia.  Sonographer:    Daphine Deutscher RDCS Referring Phys: [id number] Jonita Albee  IMPRESSIONS   1. Left ventricular ejection fraction, by estimation, is 60 to 65%. The left ventricle has normal function. The left ventricle has no regional wall motion abnormalities. Left ventricular diastolic function could not be evaluated. 2. Right ventricular systolic function is normal. The right ventricular size is normal. 3. The mitral valve is normal in structure. No evidence of mitral valve regurgitation. No evidence of mitral stenosis. 4. The aortic valve is normal in structure. Aortic valve regurgitation is not visualized. No aortic stenosis is present. 5. The inferior vena cava is normal in size with greater than 50% respiratory variability, suggesting right atrial pressure of 3 mmHg.  FINDINGS Left Ventricle: Left ventricular ejection fraction, by estimation, is 60 to 65%. The left ventricle has normal function. The left ventricle has no regional wall motion abnormalities. Definity contrast agent was given IV to delineate  the left ventricular endocardial borders. The left ventricular internal cavity size was normal in size. There is no left ventricular hypertrophy. Left ventricular diastolic function could not be evaluated due to atrial fibrillation. Left ventricular diastolic function could not be evaluated.  Right Ventricle: The right ventricular size is normal. No increase in right ventricular wall thickness. Right ventricular systolic function is normal.  Left Atrium: Left atrial size was normal in size.  Right Atrium: Right atrial size was normal in size.  Pericardium: There is no evidence of pericardial effusion.  Mitral Valve: The mitral valve is normal in structure. No evidence of mitral valve regurgitation. No evidence of mitral valve stenosis.  Tricuspid Valve: The tricuspid valve is normal in structure. Tricuspid valve regurgitation is mild . No evidence of tricuspid stenosis.  Aortic Valve: The aortic valve is normal in structure. Aortic valve regurgitation is not visualized. No aortic stenosis is present.  Pulmonic Valve: The pulmonic valve was normal in structure. Pulmonic valve regurgitation is mild. No evidence of pulmonic stenosis.  Aorta: The aortic root is normal in  size and structure.  Venous: The inferior vena cava is normal in size with greater than 50% respiratory variability, suggesting right atrial pressure of 3 mmHg.  IAS/Shunts: No atrial level shunt detected by color flow Doppler.  Additional Comments: 3D imaging was not performed.   LEFT VENTRICLE PLAX 2D LVIDd:         4.60 cm LVIDs:         2.50 cm LV PW:         0.70 cm LV IVS:        0.70 cm LVOT diam:     2.00 cm LV SV:         49 LV SV Index:   23 LVOT Area:     3.14 cm   RIGHT VENTRICLE             IVC RV Basal diam:  4.10 cm     IVC diam: 1.80 cm RV S prime:     14.67 cm/s TAPSE (M-mode): 3.3 cm  LEFT ATRIUM             Index        RIGHT ATRIUM           Index LA diam:        4.20 cm 1.94 cm/m   RA  Area:     17.60 cm LA Vol (A2C):   40.1 ml 18.55 ml/m  RA Volume:   54.60 ml  25.26 ml/m LA Vol (A4C):   39.3 ml 18.18 ml/m LA Biplane Vol: 40.0 ml 18.50 ml/m AORTIC VALVE LVOT Vmax:   92.87 cm/s LVOT Vmean:  60.600 cm/s LVOT VTI:    0.157 m  AORTA Ao Root diam: 3.80 cm Ao Asc diam:  3.40 cm  MV E velocity: 111.00 cm/s  TRICUSPID VALVE TR Peak grad:   32.5 mmHg TR Vmax:        285.00 cm/s  SHUNTS Systemic VTI:  0.16 m Systemic Diam: 2.00 cm  Clearnce Hasten Electronically signed by Clearnce Hasten Signature Date/Time: 06/27/2023/12:56:46 PM    Final      CT SCANS  CT CARDIAC SCORING (SELF PAY ONLY) 02/19/2022  Addendum 02/22/2022 12:14 PM ADDENDUM REPORT: 02/22/2022 12:12  EXAM: OVER-READ INTERPRETATION  CT CHEST  The following report is an over-read performed by radiologist Dr. Maryelizabeth Rowan [facility] on 02/22/2022. This over-read does not include interpretation of cardiac or coronary anatomy or pathology. The calcium score interpretation by the cardiologist is attached.  COMPARISON:  None.  FINDINGS: Limited view of the lung parenchyma demonstrates no suspicious nodularity. Airways are normal.  Limited view of the mediastinum demonstrates no adenopathy. Esophagus normal.  Limited view of the upper abdomen unremarkable.  Limited view of the skeleton and chest wall is unremarkable.  IMPRESSION: No significant extracardiac findings.   Electronically Signed By: Genevive Bi M.D. On: 02/22/2022 12:12  Narrative CLINICAL DATA:  Cardiovascular disease risk stratification  CAD screening, intermediate CAD risk, not treadmill candidate  EXAM: CT Coronary Calcium Score  TECHNIQUE: A gated, non-contrast computed tomography scan of the heart was performed using 3mm slice thickness. Axial images were analyzed on a dedicated workstation. Calcium scoring of the coronary arteries was performed using the Agatston  method.  FINDINGS: Coronary Calcium Score:  Left main: 83, challenging to distinguish from aortic root calcifications due to slice thickness and noncontrast exam. Bryant be overestimated.  Left anterior descending artery: 198  Left circumflex artery: 0  Right coronary artery: 36.2  Total: 317  Percentile: 68th  Pericardium: Normal.  Ascending Aorta: Normal caliber. Ascending aorta measures approximately 35mm at the mid ascending aorta measured in an axial plane.  Non-cardiac: See separate report from [facility].  IMPRESSION: Coronary calcium score of 317. This was 68th percentile for age-, race-, and sex-matched controls.  RECOMMENDATIONS: Coronary artery calcium (CAC) score is a strong predictor of incident coronary heart disease (CHD) and provides predictive information beyond traditional risk factors. CAC scoring is reasonable to use in the decision to withhold, postpone, or initiate statin therapy in intermediate-risk or selected borderline-risk asymptomatic adults (age 79-75 years and LDL-C >=70 to <190 mg/dL) who do not have diabetes or established atherosclerotic cardiovascular disease (ASCVD).* In intermediate-risk (10-year ASCVD risk >=7.5% to <20%) adults or selected borderline-risk (10-year ASCVD risk >=5% to <7.5%) adults in whom a CAC score is measured for the purpose of making a treatment decision the following recommendations have been made:  If CAC=0, it is reasonable to withhold statin therapy and reassess in 5 to 10 years, as long as higher risk conditions are absent (diabetes mellitus, family history of premature CHD in first degree relatives (males <55 years; females <65 years), cigarette smoking, or LDL >=190 mg/dL).  If CAC is 1 to 99, it is reasonable to initiate statin therapy for patients >=26 years of age.  If CAC is >=100 or >=75th percentile, it is reasonable to initiate statin therapy at any age.  Cardiology referral should  be considered for patients with CAC scores >=400 or >=75th percentile.  *2018 AHA/ACC/AACVPR/AAPA/ABC/ACPM/ADA/AGS/APhA/ASPC/NLA/PCNA Guideline on the Management of Blood Cholesterol: A Report of the American College of Cardiology/American Heart Association Task Force on Clinical Practice Guidelines. J Am Coll Cardiol. 2019;73(24):3168-3209.  Electronically Signed: By: Weston Brass M.D. On: 02/20/2022 14:57     ______________________________________________________________________________________________       Current Reported Medications:.    Current Meds  Medication Sig   amLODipine (NORVASC) 10 MG tablet Take 1 tablet (10 mg total) by mouth daily.   apixaban (ELIQUIS) 5 MG TABS tablet Take 1 tablet (5 mg total) by mouth 2 (two) times daily.   atorvastatin (LIPITOR) 40 MG tablet TAKE 1 TABLET BY MOUTH DAILY   fluticasone-salmeterol (WIXELA INHUB) 500-50 MCG/ACT AEPB USE 1 INHALATION BY MOUTH  TWICE DAILY   hydrochlorothiazide (HYDRODIURIL) 12.5 MG tablet TAKE 1 TABLET BY MOUTH DAILY   ipratropium-albuterol (DUONEB) 0.5-2.5 (3) MG/3ML SOLN Take 3 mLs by nebulization every 6 (six) hours as needed.   lisinopril (ZESTRIL) 20 MG tablet Take 1 tablet (20 mg total) by mouth daily.   metoprolol succinate (TOPROL-XL) 50 MG 24 hr tablet TAKE 1 TABLET BY MOUTH DAILY  WITH OR IMMEDIATELY FOLLOWING A  MEAL   omeprazole (PRILOSEC) 40 MG capsule Take 1 capsule (40 mg total) by mouth daily.   tadalafil (CIALIS) 20 MG tablet Take 1 tablet (20 mg total) by mouth daily as needed for erectile dysfunction.   VENTOLIN HFA 108 (90 Base) MCG/ACT inhaler USE 2 INHALATIONS BY MOUTH EVERY 6 HOURS AS NEEDED FOR WHEEZING  OR SHORTNESS OF BREATH   [DISCONTINUED] predniSONE (DELTASONE) 20 MG tablet Take 3 tablets (60 mg total) by mouth daily.   Physical Exam:    VS:  BP 118/60   Pulse 95   Ht 5\' 10"  (1.778 m)   Wt 219 lb (99.3 kg)   SpO2 96%   BMI 31.42 kg/m    Wt Readings from Last 3 Encounters:   07/08/23 219 lb (99.3 kg)  06/03/23 217 lb 3.2  oz (98.5 kg)  06/01/23 214 lb 9.6 oz (97.3 kg)    GEN: Well nourished, well developed in no acute distress NECK: No JVD; No carotid bruits CARDIAC: Irregular RR, no murmurs, rubs, gallops RESPIRATORY:  Clear to auscultation without rales, wheezing or rhonchi  ABDOMEN: Soft, non-tender, non-distended EXTREMITIES:  No edema; No acute deformity   Asessement and Plan:.    Persistent atrial fibrillation: Patient with one reported episode of atrial fibrillation a year ago lasting 24 hours.  On 05/30/2023 he was seen in office for atrial fibrillation noted on his Apple Watch.  He denies any palpitations today, overall feels he is doing well. EKG today indicates atrial fibrillation at 88 bpm.  Patient reports adherence with all medications, he denies missing any doses of Eliquis. Reviewed with Dr. Rennis Golden, DOD at [facility], he agreed with plan for cardioversion. Discussed recommendation for cardioversion with patient and his wife.  Patient agrees to proceed with cardioversion, please see consent below. Check CBC and BMET.  Continue Eliquis 5 mg twice daily and metoprolol succinate 50 mg daily. CHA2DS2-VASc Score = 3 [CHF History: 0, HTN History: 1, Diabetes History: 0, Stroke History: 0, Vascular Disease History: 1, Age Score: 1, Gender Score: 0].  Therefore, the patient's annual risk of stroke is 3.2 %.     Informed Consent   Shared Decision Making/Informed Consent The risks (stroke, cardiac arrhythmias rarely resulting in the need for a temporary or permanent pacemaker, skin irritation or burns and complications associated with conscious sedation including aspiration, arrhythmia, respiratory failure and death), benefits (restoration of normal sinus rhythm) and alternatives of a direct current cardioversion were explained in detail to Mr. Hailes and he agrees to proceed.       Elevated coronary calcium score: CT cardiac scoring from 10/23 indicated coronary  calcium score of 317, 68 percentile. Today patient denies chest pain or shortness of breath. Continue Lipitor 40 mg daily  Hypertension: Blood pressure today 118/60. Continue amlodipine 10 mg daily, hydrochlorothiazide 12.5 mg daily, lisinopril 20 mg daily and metoprolol succinate 50 mg daily.   Carotid artery disease: Previously underwent right carotid endarterectomy in 2008.  Carotid ultrasounds from 01/2022 showed 1 to 39% stenosis in bilateral ICAs.  Please note appreciated on exam today . Continue Lipitor 40 mg daily.  Will plan to order carotid duplex on follow-up.   Disposition: F/u with Reather Littler, NP in three weeks.   Signed, Rip Harbour, NP

## 2023-07-06 ENCOUNTER — Other Ambulatory Visit: Payer: Self-pay

## 2023-07-07 MED ORDER — LISINOPRIL 20 MG PO TABS: 20.0000 mg | ORAL_TABLET | Freq: Every day | ORAL | 1 refills | Status: DC

## 2023-07-08 ENCOUNTER — Ambulatory Visit: Payer: Medicare Other | Attending: Cardiology | Admitting: Cardiology

## 2023-07-08 ENCOUNTER — Ambulatory Visit: Payer: Medicare Other | Admitting: Adult Health

## 2023-07-08 ENCOUNTER — Encounter: Payer: Self-pay | Admitting: Cardiology

## 2023-07-08 VITALS — BP 118/60 | HR 95 | Ht 70.0 in | Wt 219.0 lb

## 2023-07-08 DIAGNOSIS — I4819 Other persistent atrial fibrillation: Secondary | ICD-10-CM

## 2023-07-08 DIAGNOSIS — E782 Mixed hyperlipidemia: Secondary | ICD-10-CM

## 2023-07-08 DIAGNOSIS — I10 Essential (primary) hypertension: Secondary | ICD-10-CM | POA: Diagnosis not present

## 2023-07-08 DIAGNOSIS — I480 Paroxysmal atrial fibrillation: Secondary | ICD-10-CM

## 2023-07-08 DIAGNOSIS — R931 Abnormal findings on diagnostic imaging of heart and coronary circulation: Secondary | ICD-10-CM

## 2023-07-08 DIAGNOSIS — I6521 Occlusion and stenosis of right carotid artery: Secondary | ICD-10-CM

## 2023-07-08 NOTE — Patient Instructions (Addendum)
 Medication Instructions:  No changes *If you need a refill on your cardiac medications before your next appointment, please call your pharmacy*  Lab Work: Today we are going to draw CBC and Bmet If you have labs (blood work) drawn today and your tests are completely normal, you will receive your results only by: MyChart Message (if you have MyChart) OR A paper copy in the mail If you have any lab test that is abnormal or we need to change your treatment, we will call you to review the results.  Testing/Procedures:   Dear Charles Bryant  You are scheduled for a Cardioversion on Wednesday, March 5 with Dr. Servando Salina.  Please arrive at the [facility] (Main Entrance A) at [facility]: [address] [place], [place] [address] at 8:30 AM (This time is 1 hour(s) before your procedure to ensure your preparation).   Free valet parking service is available. You will check in at ADMITTING.   *Please Note: You will receive a call the day before your procedure to confirm the appointment time. That time may have changed from the original time based on the schedule for that day.*   DIET:  Nothing to eat or drink after midnight except a sip of water with medications (see medication instructions below)  MEDICATION INSTRUCTIONS: !!IF ANY NEW MEDICATIONS ARE STARTED AFTER TODAY, PLEASE NOTIFY YOUR PROVIDER AS SOON AS POSSIBLE!!  FYI: Medications such as Semaglutide (Ozempic, [place]), Tirzepatide (Mounjaro, Zepbound), Dulaglutide (Trulicity), etc ("GLP1 agonists") AND Canagliflozin (Invokana), Dapagliflozin (Farxiga), Empagliflozin (Jardiance), Ertugliflozin (Steglatro), Bexagliflozin [employer]) or any combination with one of these drugs such as Invokamet (Canagliflozin/Metformin), Synjardy (Empagliflozin/Metformin), etc ("SGLT2 inhibitors") must be held around the time of a procedure. This is not a comprehensive list of all of these drugs. Please review all of your medications and talk to your provider if you  take any one of these. If you are not sure, ask your provider.  Continue taking your anticoagulant (blood thinner): Apixaban (Eliquis).  You will need to continue this after your procedure until you are told by your provider that it is safe to stop.    LABS:Will be drawn today  FYI:  For your safety, and to allow [place] to monitor your vital signs accurately during the surgery/procedure we request: If you have artificial nails, gel coating, SNS etc, please have those removed prior to your surgery/procedure. Not having the nail coverings /polish removed may result in cancellation or delay of your surgery/procedure.  Your support person will be asked to wait in the waiting room during your procedure.  It is OK to have someone drop you off and come back when you are ready to be discharged.  You cannot drive after the procedure and will need someone to drive you home.  Bring your insurance cards.  *Special Note: Every effort is made to have your procedure done on time. Occasionally there are emergencies that occur at the hospital that may cause delays. Please be patient if a delay does occur.   Follow-Up: At [facility], you and your health needs are our priority.  As part of our continuing mission to provide you with exceptional heart care, we have created designated Provider Care Teams.  These Care Teams include your primary Cardiologist (physician) and Advanced Practice Providers (APPs -  Physician Assistants and Nurse Practitioners) who all work together to provide you with the care you need, when you need it.  We recommend signing up for the patient portal called "MyChart".  Sign  up information is provided on this After Visit Summary.  MyChart is used to connect with patients for Virtual Visits (Telemedicine).  Patients are able to view lab/test results, encounter notes, upcoming appointments, etc.  Non-urgent messages can be sent to your provider as well.   To learn more about what you  can do with MyChart, go to [email or web address].    Your next appointment:   3 week(s)  Provider:   Edd Fabian, FNP, Bernadene Person, NP, or Reather Littler, NP    Then, Nanetta Batty, MD will plan to see you again in 3 - 4 month(s).

## 2023-07-11 LAB — CBC

## 2023-07-12 ENCOUNTER — Telehealth: Payer: Self-pay

## 2023-07-12 LAB — CBC
Hematocrit: 42 % (ref 37.5–51.0)
Hemoglobin: 14.2 g/dL (ref 13.0–17.7)
MCH: 31.3 pg (ref 26.6–33.0)
MCHC: 33.8 g/dL (ref 31.5–35.7)
MCV: 93 fL (ref 79–97)
Platelets: 246 10*3/uL (ref 150–450)
RBC: 4.53 x10E6/uL (ref 4.14–5.80)
RDW: 12.8 % (ref 11.6–15.4)
WBC: 7.6 10*3/uL (ref 3.4–10.8)

## 2023-07-12 LAB — BASIC METABOLIC PANEL
BUN/Creatinine Ratio: 16 (ref 10–24)
BUN: 14 mg/dL (ref 8–27)
CO2: 24 mmol/L (ref 20–29)
Calcium: 9.5 mg/dL (ref 8.6–10.2)
Chloride: 94 mmol/L — ABNORMAL LOW (ref 96–106)
Creatinine, Ser: 0.89 mg/dL (ref 0.76–1.27)
Glucose: 117 mg/dL — ABNORMAL HIGH (ref 70–99)
Potassium: 4.4 mmol/L (ref 3.5–5.2)
Sodium: 131 mmol/L — ABNORMAL LOW (ref 134–144)
eGFR: 93 mL/min/{1.73_m2} (ref >59)

## 2023-07-12 NOTE — Progress Notes (Signed)
 Called patient with pre-procedure instructions for tomorrow.   Patient informed of:   Time to arrive for procedure 0800 Remain NPO past midnight.  Must have a ride home and a responsible adult to remain with them for 24 hours post procedure.  Confirmed blood thinner. Eliquis Confirmed no breaks in taking blood thinner for 3+ weeks prior to procedure. Confirmed patient stopped all GLP-1s and GLP-2s for at least one week before procedure.

## 2023-07-12 NOTE — Telephone Encounter (Signed)
 Called patient advised of below they verbalized understanding.

## 2023-07-12 NOTE — Telephone Encounter (Signed)
-----   Message from Rip Harbour sent at 07/12/2023  1:34 PM EST ----- Please let Charles Bryant know that his CBC shows no evidence of anemia or infection. His kidney function is normal and his potassium is normal. His sodium level is slighlty low. Overall good results, continue current medications and follow up as planned.

## 2023-07-13 ENCOUNTER — Ambulatory Visit (HOSPITAL_COMMUNITY): Admitting: Anesthesiology

## 2023-07-13 ENCOUNTER — Ambulatory Visit (HOSPITAL_COMMUNITY)
Admission: RE | Admit: 2023-07-13 | Discharge: 2023-07-13 | Disposition: A | Discharge status: Home / Self Care | Payer: Medicare Other | Attending: Cardiology | Admitting: Cardiology

## 2023-07-13 ENCOUNTER — Encounter (HOSPITAL_COMMUNITY)
Admission: RE | Disposition: A | Discharge status: Home / Self Care | Payer: Self-pay | Source: Home / Self Care | Attending: Cardiology

## 2023-07-13 ENCOUNTER — Other Ambulatory Visit: Payer: Self-pay

## 2023-07-13 ENCOUNTER — Encounter (HOSPITAL_COMMUNITY): Payer: Self-pay | Admitting: Cardiology

## 2023-07-13 DIAGNOSIS — I10 Essential (primary) hypertension: Secondary | ICD-10-CM

## 2023-07-13 DIAGNOSIS — Z79899 Other long term (current) drug therapy: Secondary | ICD-10-CM | POA: Insufficient documentation

## 2023-07-13 DIAGNOSIS — J449 Chronic obstructive pulmonary disease, unspecified: Secondary | ICD-10-CM | POA: Diagnosis not present

## 2023-07-13 DIAGNOSIS — I4891 Unspecified atrial fibrillation: Secondary | ICD-10-CM | POA: Diagnosis not present

## 2023-07-13 DIAGNOSIS — Z87891 Personal history of nicotine dependence: Secondary | ICD-10-CM

## 2023-07-13 DIAGNOSIS — I6523 Occlusion and stenosis of bilateral carotid arteries: Secondary | ICD-10-CM | POA: Diagnosis not present

## 2023-07-13 DIAGNOSIS — Z7901 Long term (current) use of anticoagulants: Secondary | ICD-10-CM | POA: Insufficient documentation

## 2023-07-13 DIAGNOSIS — I4819 Other persistent atrial fibrillation: Secondary | ICD-10-CM | POA: Insufficient documentation

## 2023-07-13 HISTORY — PX: CARDIOVERSION: EP1203

## 2023-07-13 SURGERY — CARDIOVERSION (CATH LAB)
Anesthesia: General

## 2023-07-13 MED ORDER — LIDOCAINE 2% (20 MG/ML) 5 ML SYRINGE
INTRAMUSCULAR | Status: DC | PRN
  Administered 2023-07-13: 80 mg via INTRAVENOUS

## 2023-07-13 MED ORDER — PROPOFOL 10 MG/ML IV BOLUS
INTRAVENOUS | Status: DC | PRN
Start: 2023-07-13 — End: 2023-07-13
  Administered 2023-07-13: 70 mg via INTRAVENOUS
  Administered 2023-07-13: 30 mg via INTRAVENOUS

## 2023-07-13 MED ORDER — SODIUM CHLORIDE 0.9 % IV SOLN: INTRAVENOUS | Status: DC

## 2023-07-13 SURGICAL SUPPLY — 1 items: PAD DEFIB RADIO PHYSIO CONN (PAD) ×1 IMPLANT

## 2023-07-13 NOTE — Anesthesia Preprocedure Evaluation (Addendum)
 Anesthesia Evaluation  Patient identified by MRN, date of birth, ID band Patient awake    Reviewed: Allergy & Precautions, NPO status , Patient's Chart, lab work & pertinent test results  Airway Mallampati: II  TM Distance: >3 FB Neck ROM: Full    Dental no notable dental hx.    Pulmonary asthma , COPD,  COPD inhaler, former smoker   Pulmonary exam normal        Cardiovascular hypertension, Pt. on medications and Pt. on home beta blockers  Rhythm:Irregular Rate:Normal     Neuro/Psych negative neurological ROS  negative psych ROS   GI/Hepatic Neg liver ROS,GERD  Medicated,,  Endo/Other  negative endocrine ROS    Renal/GU negative Renal ROS  negative genitourinary   Musculoskeletal negative musculoskeletal ROS (+)    Abdominal Normal abdominal exam  (+)   Peds  Hematology Lab Results      Component                Value               Date                      WBC                      7.6                 07/11/2023                HGB                      14.2                07/11/2023                HCT                      42.0                07/11/2023                MCV                      93                  07/11/2023                PLT                      246                 07/11/2023              Anesthesia Other Findings   Reproductive/Obstetrics                             Anesthesia Physical Anesthesia Plan  ASA: 3  Anesthesia Plan: General   Post-op Pain Management:    Induction: Intravenous  PONV Risk Score and Plan: 2  Airway Management Planned: Mask  Additional Equipment: None  Intra-op Plan:   Post-operative Plan:   Informed Consent: I have reviewed the patients History and Physical, chart, labs and discussed the procedure including the risks, benefits and alternatives for the proposed anesthesia with the patient or authorized representative who has indicated  his/her understanding and acceptance.  Dental advisory given  Plan Discussed with: CRNA  Anesthesia Plan Comments:        Anesthesia Quick Evaluation

## 2023-07-13 NOTE — Anesthesia Postprocedure Evaluation (Signed)
 Anesthesia Post Note  Patient: Charles Bryant  Procedure(s) Performed: CARDIOVERSION     Patient location during evaluation: PACU Anesthesia Type: General Level of consciousness: awake and alert Pain management: pain level controlled Vital Signs Assessment: post-procedure vital signs reviewed and stable Respiratory status: spontaneous breathing, nonlabored ventilation, respiratory function stable and patient connected to nasal cannula oxygen Cardiovascular status: blood pressure returned to baseline and stable Postop Assessment: no apparent nausea or vomiting Anesthetic complications: no   No notable events documented.  Last Vitals:  Vitals:   07/13/23 0934 07/13/23 0935  BP: 120/73 120/73  Pulse: 88 93  Resp: 12 19  Temp:    SpO2: 97% 95%    Last Pain:  Vitals:   07/13/23 0921  TempSrc: Temporal  PainSc: 0-No pain                 Charles Bryant

## 2023-07-13 NOTE — Transfer of Care (Signed)
 Immediate Anesthesia Transfer of Care Note  Patient: Charles Bryant  Procedure(s) Performed: CARDIOVERSION  Patient Location: Cath Lab  Anesthesia Type:MAC  Level of Consciousness: awake, alert , oriented, and patient cooperative  Airway & Oxygen Therapy: Patient Spontanous Breathing and Patient connected to face mask oxygen  Post-op Assessment: Report given to RN, Post -op Vital signs reviewed and stable, Patient moving all extremities, Patient moving all extremities X 4, and Patient able to stick tongue midline  Post vital signs: Reviewed and stable  Last Vitals:  Vitals Value Taken Time  BP    Temp    Pulse    Resp    SpO2      Last Pain:  Vitals:   07/13/23 0830  TempSrc:   PainSc: 0-No pain         Complications: No notable events documented.

## 2023-07-13 NOTE — Interval H&P Note (Signed)
 History and Physical Interval Note:  07/13/2023 9:27 AM  Charles Bryant  has presented today for surgery, with the diagnosis of AFIB.  The various methods of treatment have been discussed with the patient and family. After consideration of risks, benefits and other options for treatment, the patient has consented to  Procedure(s): CARDIOVERSION (N/A) as a surgical intervention.  The patient's history has been reviewed, patient examined, no change in status, stable for surgery.  I have reviewed the patient's chart and labs.  Questions were answered to the patient's satisfaction.     [name]

## 2023-07-13 NOTE — Interval H&P Note (Signed)
 History and Physical Interval Note:  07/13/2023 8:42 AM  Charles Bryant  has presented today for surgery, with the diagnosis of AFIB.  The various methods of treatment have been discussed with the patient and family. After consideration of risks, benefits and other options for treatment, the patient has consented to  Procedure(s): CARDIOVERSION (N/A) as a surgical intervention.  The patient's history has been reviewed, patient examined, no change in status, stable for surgery.  I have reviewed the patient's chart and labs.  Questions were answered to the patient's satisfaction.     Rachana Malesky

## 2023-07-13 NOTE — CV Procedure (Signed)
   Electrical Cardioversion Procedure Note Prinston Kynard [id number] 09/16/1955  Procedure: Electrical Cardioversion Indications:  Atrial Fibrillation  Time Out: Verified patient identification, verified procedure,medications/allergies/relevent history reviewed, required imaging and test results available.  Performed  Procedure Details  The patient signed informed consent.   The patient was NPO past midnight. Has had therapeutic anticoagulation with Eliquis greater than 3 weeks. The patient denies any interruption of anticoagulation.  Anesthesia was administered by Dr. Yolande Jolly.  Adequate airway was maintained throughout and vital followed per protocol.  He was cardioverted x 1 with 200J, x 2 300 J  of biphasic synchronized energy.  He converted to NSR.  There were no apparent complications.  The patient tolerated the procedure well and had normal neuro status and respiratory status post procedure with vitals stable as recorded elsewhere.     IMPRESSION:  Successful cardioversion of atrial fibrillation   Follow up:  We will arrange follow up with primary cardiologist.  He will continue on current medical therapy.  The patient advised to continue anticoagulation.  Zavian Slowey 07/13/2023, 9:27 AM

## 2023-07-14 ENCOUNTER — Encounter: Payer: Self-pay | Admitting: Cardiovascular Disease

## 2023-07-14 ENCOUNTER — Other Ambulatory Visit: Payer: Self-pay

## 2023-07-14 DIAGNOSIS — I4819 Other persistent atrial fibrillation: Secondary | ICD-10-CM

## 2023-07-14 MED ORDER — AMLODIPINE BESYLATE 10 MG PO TABS: 10.0000 mg | ORAL_TABLET | Freq: Every day | ORAL | 1 refills | Status: DC

## 2023-07-18 ENCOUNTER — Other Ambulatory Visit: Payer: Self-pay

## 2023-07-18 MED ORDER — METOPROLOL SUCCINATE ER 50 MG PO TB24: ORAL_TABLET | ORAL | 0 refills | Status: DC

## 2023-07-19 ENCOUNTER — Encounter: Payer: Self-pay | Admitting: Cardiovascular Disease

## 2023-07-19 ENCOUNTER — Other Ambulatory Visit: Payer: Self-pay | Admitting: Cardiovascular Disease

## 2023-07-19 MED ORDER — DABIGATRAN ETEXILATE MESYLATE 150 MG PO PACK: 150.0000 mg | PACK | Freq: Two times a day (BID) | ORAL | 11 refills | Status: DC

## 2023-07-19 NOTE — Telephone Encounter (Signed)
 We do sometimes see a rash with Eliquis and Xarelto.  May want to try dabigatran   (150 mg bid)

## 2023-07-19 NOTE — Telephone Encounter (Signed)
 Spoke with pt regarding potential allergic reaction with Eliquis. Pt states that he hasn't started any other medications or eaten any new foods that could be causing this reaction. Pt states that his wife has had him take benadryl 50mg  every 4 hours to see if that would help. Pt states that he did have some relief with this. He has also tried taking claritin to help with reaction as well. Pt states that it didn't clear up the reaction however, it did provide some relief. Will send to PharmD to advise on medication change, if appropriate.

## 2023-07-19 NOTE — Telephone Encounter (Signed)
 Called pt back with recommendation from PharmD. Pt will discontinue eliquis and start pradaxa 150mg  twice daily. New prescription sent to pt's preferred pharmacy. Pt has no further questions at this time.

## 2023-07-20 ENCOUNTER — Encounter (HOSPITAL_COMMUNITY): Payer: Self-pay | Admitting: Physician Assistant

## 2023-07-20 ENCOUNTER — Other Ambulatory Visit (HOSPITAL_COMMUNITY): Payer: Self-pay

## 2023-07-20 ENCOUNTER — Telehealth: Payer: Self-pay | Admitting: Pharmacist

## 2023-07-20 ENCOUNTER — Ambulatory Visit (HOSPITAL_COMMUNITY)
Admission: RE | Admit: 2023-07-20 | Discharge: 2023-07-20 | Disposition: A | Discharge status: Home / Self Care | Source: Ambulatory Visit | Attending: Physician Assistant | Admitting: Physician Assistant

## 2023-07-20 ENCOUNTER — Telehealth (HOSPITAL_COMMUNITY): Payer: Self-pay | Admitting: Pharmacy Technician

## 2023-07-20 VITALS — BP 128/82 | HR 70 | Ht 70.0 in | Wt 222.6 lb

## 2023-07-20 DIAGNOSIS — I4819 Other persistent atrial fibrillation: Secondary | ICD-10-CM | POA: Insufficient documentation

## 2023-07-20 DIAGNOSIS — R0683 Snoring: Secondary | ICD-10-CM | POA: Insufficient documentation

## 2023-07-20 DIAGNOSIS — I2510 Atherosclerotic heart disease of native coronary artery without angina pectoris: Secondary | ICD-10-CM | POA: Diagnosis not present

## 2023-07-20 DIAGNOSIS — I10 Essential (primary) hypertension: Secondary | ICD-10-CM | POA: Insufficient documentation

## 2023-07-20 DIAGNOSIS — D6869 Other thrombophilia: Secondary | ICD-10-CM | POA: Insufficient documentation

## 2023-07-20 DIAGNOSIS — Z87891 Personal history of nicotine dependence: Secondary | ICD-10-CM | POA: Diagnosis not present

## 2023-07-20 DIAGNOSIS — E785 Hyperlipidemia, unspecified: Secondary | ICD-10-CM | POA: Diagnosis not present

## 2023-07-20 DIAGNOSIS — Z7901 Long term (current) use of anticoagulants: Secondary | ICD-10-CM | POA: Insufficient documentation

## 2023-07-20 MED ORDER — FUROSEMIDE 20 MG PO TABS: 20.0000 mg | ORAL_TABLET | Freq: Every day | ORAL | 1 refills | Status: DC | PRN

## 2023-07-20 NOTE — Telephone Encounter (Signed)
 Patient Product/process development scientist completed.    The patient is insured through [facility]. Patient has Medicare and is not eligible for a copay card, but may be able to apply for patient assistance or Medicare RX Payment Plan (Patient Must reach out to their plan, if eligible for payment plan), if available.    Ran test claim for dofetilide (Tikosyn) 500 mcg and the current 30 day co-pay is $47.00.   This test claim was processed through [facility]- copay amounts may vary at other pharmacies due to pharmacy/plan contracts, or as the patient moves through the different stages of their insurance plan.     Roland Earl, CPHT Pharmacy Technician III Certified Patient Advocate [facility] Patient Advocate Team Direct Number: [phone]  Fax: [phone]

## 2023-07-20 NOTE — Telephone Encounter (Addendum)
 Prescription refill request for Pradaxa received. Indication: AF Last office visit: 07/08/23  Charles Qua NP Scr: Age: 68 Weight: 99.3kg  Based on above findings Pradaxa 150mg  twice daily is the appropriate dose.  Refill approved.

## 2023-07-20 NOTE — Progress Notes (Signed)
 Primary Care Physician: Larae Grooms, NP Primary Cardiologist: Nanetta Batty, MD Electrophysiologist: None  Referring Physician: Dr Lesli Albee Cochrane is a 68 y.o. male with a history of carotid artery disease s/p endarterectomy 2008, CAD, prior tobacco use, HLD, HTN, COPD, atrial fibrillation who presents for follow up in the [facility]. He was seen by his PCP on 1/22 for evaluation of irregular heart rates, his Apple Watch had been alerting him for atrial fibrillation over the weekend with heart rate between the 70s and 90s. Patient was on metoprolol 50 mg daily, his PCP started him on Eliquis 5 mg twice daily. He underwent DCCV on  07/13/23. He developed a rash after starting Eliquis and this was changed to Pradaxa.   Patient presents today for follow up for atrial fibrillation. He is unaware of his arrhythmia and rate controlled. He plans to pick up Pradaxa today to switch off of Eliquis. No bleeding issues. He does admit to snoring.   Today, he denies symptoms of palpitations, chest pain, shortness of breath, orthopnea, PND, lower extremity edema, dizziness, presyncope, syncope, bleeding, or neurologic sequela. The patient is tolerating medications without difficulties and is otherwise without complaint today.    Atrial Fibrillation Risk Factors:  he does have symptoms or diagnosis of sleep apnea. he does not have a history of rheumatic fever.   Atrial Fibrillation Management history:  Previous antiarrhythmic drugs: none Previous cardioversions: 07/13/23 Previous ablations: none Anticoagulation history: Eliquis (rash), Pradaxa   ROS- All systems are reviewed and negative except as per the HPI above.  Past Medical History:  Diagnosis Date   Advanced care planning/counseling discussion 07/02/2022   Allergy Childhood   Asthma    COPD (chronic obstructive pulmonary disease) (HCC)    COPD (chronic obstructive pulmonary disease) (HCC)    GERD  (gastroesophageal reflux disease)    Hyperlipidemia    Hypertension     Current Outpatient Medications  Medication Sig Dispense Refill   amLODipine (NORVASC) 10 MG tablet Take 1 tablet (10 mg total) by mouth daily. 90 tablet 1   atorvastatin (LIPITOR) 40 MG tablet TAKE 1 TABLET BY MOUTH DAILY 30 tablet 11   dabigatran (PRADAXA) 150 MG CAPS capsule TAKE 1 CAPSULE BY MOUTH TWICE A DAY 60 capsule 5   fluticasone-salmeterol (WIXELA INHUB) 500-50 MCG/ACT AEPB USE 1 INHALATION BY MOUTH  TWICE DAILY 180 each 3   hydrochlorothiazide (HYDRODIURIL) 12.5 MG tablet TAKE 1 TABLET BY MOUTH DAILY 100 tablet 1   ipratropium-albuterol (DUONEB) 0.5-2.5 (3) MG/3ML SOLN Take 3 mLs by nebulization every 6 (six) hours as needed. 360 mL 1   lisinopril (ZESTRIL) 20 MG tablet Take 1 tablet (20 mg total) by mouth daily. 90 tablet 1   metoprolol succinate (TOPROL-XL) 50 MG 24 hr tablet TAKE 1 TABLET BY MOUTH DAILY  WITH OR IMMEDIATELY FOLLOWING A  MEAL 100 tablet 0   omeprazole (PRILOSEC) 40 MG capsule Take 1 capsule (40 mg total) by mouth daily. 100 capsule 1   quinapril (ACCUPRIL) 10 MG tablet Take 10 mg by mouth daily.     tadalafil (CIALIS) 20 MG tablet Take 1 tablet (20 mg total) by mouth daily as needed for erectile dysfunction. 10 tablet 5   VENTOLIN HFA 108 (90 Base) MCG/ACT inhaler USE 2 INHALATIONS BY MOUTH EVERY 6 HOURS AS NEEDED FOR WHEEZING  OR SHORTNESS OF BREATH 36 g 6   No current facility-administered medications for this encounter.    Physical Exam: BP 128/82  Pulse 70   Ht 5\' 10"  (1.778 m)   Wt 101 kg   BMI 31.94 kg/m   GEN: Well nourished, well developed in no acute distress CARDIAC: Irregularly irregular rate and rhythm, no murmurs, rubs, gallops RESPIRATORY:  Clear to auscultation without rales, wheezing or rhonchi  ABDOMEN: Soft, non-tender, non-distended EXTREMITIES:  No edema; No deformity  SKIN: urticaria on bilateral forearms and lower legs with excoriations   Wt Readings from  Last 3 Encounters:  07/20/23 101 kg  07/13/23 98.4 kg  07/08/23 99.3 kg     EKG today demonstrates  Afib Vent. rate 70 BPM PR interval * ms QRS duration 92 ms QT/QTcB 352/380 ms   Echo 06/27/23 demonstrated   1. Left ventricular ejection fraction, by estimation, is 60 to 65%. The  left ventricle has normal function. The left ventricle has no regional  wall motion abnormalities. Left ventricular diastolic function could not  be evaluated.   2. Right ventricular systolic function is normal. The right ventricular  size is normal.   3. The mitral valve is normal in structure. No evidence of mitral valve  regurgitation. No evidence of mitral stenosis.   4. The aortic valve is normal in structure. Aortic valve regurgitation is  not visualized. No aortic stenosis is present.   5. The inferior vena cava is normal in size with greater than 50%  respiratory variability, suggesting right atrial pressure of 3 mmHg.    CHA2DS2-VASc Score = 3  The patient's score is based upon: CHF History: 0 HTN History: 1 Diabetes History: 0 Stroke History: 0 Vascular Disease History: 1 Age Score: 1 Gender Score: 0       ASSESSMENT AND PLAN: Persistent Atrial Fibrillation (ICD10:  I48.19) The patient's CHA2DS2-VASc score is 3, indicating a 3.2% annual risk of stroke.   S/p DCCV 07/13/23 with quick return of afib.  We discussed rate vs rhythm control today. Would prefer a rhythm strategy with younger age. After discussing the risks and benefits, patient would like to pursue dofetilide admission. Continue Pradaxa 150 mg BID PharmD to screen medications for QT prolonging agents. Will stop hydrochlorothiazide today. Start lasix 20 mg daily PRN for swelling.  QTc in SR 423 ms Recent bmet and magnesium reviewed. He has started an OTC magnesium supplement.  Continue Toprol 50 mg daily Apple Watch for home monitoring.   Secondary Hypercoagulable State (ICD10:  D68.69) The patient is at significant risk  for stroke/thromboembolism based upon his CHA2DS2-VASc Score of 3.  Continue Dabigatran (Pradaxa). No bleeding issues.   HTN Stable on current regimen  Snoring/Suspected OSA  The importance of adequate treatment of sleep apnea was discussed today in order to improve our ability to maintain sinus rhythm long term. Refer for sleep study.   CAD No anginal symptoms Followed by Dr Allyson Sabal.    Follow up in the AF clinic for dofetilide loading 08/2023.      Jorja Loa PA-C [facility] [address] [place], [place] [address] [phone]

## 2023-07-20 NOTE — Patient Instructions (Signed)
 Stop hydrochlorothiazide   Start lasix 20mg  - take 1 tablet daily as needed for swelling/weight gain   Referral placed for sleep study

## 2023-07-20 NOTE — Telephone Encounter (Signed)
 Medication list reviewed in anticipation of upcoming Tikosyn initiation. Patient is not taking any contraindicated or QTc prolonging medications.   Patient is anticoagulated on dabigatran on the appropriate dose. Please ensure that patient has not missed any anticoagulation doses in the 3 weeks prior to Tikosyn initiation.   Patient will need to be counseled to avoid use of Benadryl while on Tikosyn and in the 2-3 days prior to Tikosyn initiation.

## 2023-07-20 NOTE — Telephone Encounter (Signed)
-----   Message from Nurse Stacy C sent at 07/20/2023 10:40 AM EDT ----- Regarding: tikosyn Pt for tikosyn please review meds thanks stacy

## 2023-07-25 ENCOUNTER — Other Ambulatory Visit (HOSPITAL_COMMUNITY): Payer: Self-pay | Admitting: *Deleted

## 2023-07-25 DIAGNOSIS — I4819 Other persistent atrial fibrillation: Secondary | ICD-10-CM

## 2023-08-05 ENCOUNTER — Ambulatory Visit: Payer: Medicare Other | Admitting: Cardiology

## 2023-08-08 ENCOUNTER — Emergency Department

## 2023-08-08 ENCOUNTER — Other Ambulatory Visit: Payer: Self-pay

## 2023-08-08 ENCOUNTER — Observation Stay
Admission: EM | Admit: 2023-08-08 | Discharge: 2023-08-10 | Disposition: A | Discharge status: Home / Self Care | Attending: Internal Medicine | Admitting: Internal Medicine

## 2023-08-08 DIAGNOSIS — Z87891 Personal history of nicotine dependence: Secondary | ICD-10-CM | POA: Diagnosis not present

## 2023-08-08 DIAGNOSIS — E785 Hyperlipidemia, unspecified: Secondary | ICD-10-CM | POA: Diagnosis present

## 2023-08-08 DIAGNOSIS — R0602 Shortness of breath: Secondary | ICD-10-CM | POA: Diagnosis not present

## 2023-08-08 DIAGNOSIS — E66811 Obesity, class 1: Secondary | ICD-10-CM | POA: Diagnosis not present

## 2023-08-08 DIAGNOSIS — E871 Hypo-osmolality and hyponatremia: Secondary | ICD-10-CM | POA: Insufficient documentation

## 2023-08-08 DIAGNOSIS — R21 Rash and other nonspecific skin eruption: Principal | ICD-10-CM

## 2023-08-08 DIAGNOSIS — I482 Chronic atrial fibrillation, unspecified: Secondary | ICD-10-CM | POA: Diagnosis present

## 2023-08-08 DIAGNOSIS — I509 Heart failure, unspecified: Secondary | ICD-10-CM | POA: Insufficient documentation

## 2023-08-08 DIAGNOSIS — E669 Obesity, unspecified: Secondary | ICD-10-CM | POA: Diagnosis present

## 2023-08-08 DIAGNOSIS — J449 Chronic obstructive pulmonary disease, unspecified: Secondary | ICD-10-CM | POA: Diagnosis not present

## 2023-08-08 DIAGNOSIS — Z6830 Body mass index (BMI) 30.0-30.9, adult: Secondary | ICD-10-CM | POA: Insufficient documentation

## 2023-08-08 DIAGNOSIS — J4540 Moderate persistent asthma, uncomplicated: Secondary | ICD-10-CM | POA: Diagnosis not present

## 2023-08-08 DIAGNOSIS — J45909 Unspecified asthma, uncomplicated: Secondary | ICD-10-CM | POA: Diagnosis not present

## 2023-08-08 DIAGNOSIS — R0609 Other forms of dyspnea: Secondary | ICD-10-CM

## 2023-08-08 DIAGNOSIS — E8770 Fluid overload, unspecified: Principal | ICD-10-CM

## 2023-08-08 DIAGNOSIS — Z7901 Long term (current) use of anticoagulants: Secondary | ICD-10-CM | POA: Insufficient documentation

## 2023-08-08 DIAGNOSIS — I110 Hypertensive heart disease with heart failure: Secondary | ICD-10-CM | POA: Insufficient documentation

## 2023-08-08 DIAGNOSIS — Z79899 Other long term (current) drug therapy: Secondary | ICD-10-CM | POA: Diagnosis not present

## 2023-08-08 DIAGNOSIS — I10 Essential (primary) hypertension: Secondary | ICD-10-CM | POA: Diagnosis present

## 2023-08-08 LAB — BASIC METABOLIC PANEL WITH GFR
Anion gap: 10 (ref 5–15)
BUN: 16 mg/dL (ref 8–23)
CO2: 27 mmol/L (ref 22–32)
Calcium: 8.9 mg/dL (ref 8.9–10.3)
Chloride: 96 mmol/L — ABNORMAL LOW (ref 98–111)
Creatinine, Ser: 0.89 mg/dL (ref 0.61–1.24)
GFR, Estimated: >60 mL/min (ref >60)
Glucose, Bld: 108 mg/dL — ABNORMAL HIGH (ref 70–99)
Potassium: 4.1 mmol/L (ref 3.5–5.1)
Sodium: 133 mmol/L — ABNORMAL LOW (ref 135–145)

## 2023-08-08 LAB — CBC WITH DIFFERENTIAL/PLATELET
Abs Immature Granulocytes: 0.04 10*3/uL (ref 0.00–0.07)
Basophils Absolute: 0.1 10*3/uL (ref 0.0–0.1)
Basophils Relative: 1 %
Eosinophils Absolute: 1 10*3/uL — ABNORMAL HIGH (ref 0.0–0.5)
Eosinophils Relative: 11 %
HCT: 44.1 % (ref 39.0–52.0)
Hemoglobin: 14.9 g/dL (ref 13.0–17.0)
Immature Granulocytes: 0 %
Lymphocytes Relative: 17 %
Lymphs Abs: 1.6 10*3/uL (ref 0.7–4.0)
MCH: 31.6 pg (ref 26.0–34.0)
MCHC: 33.8 g/dL (ref 30.0–36.0)
MCV: 93.4 fL (ref 80.0–100.0)
Monocytes Absolute: 0.8 10*3/uL (ref 0.1–1.0)
Monocytes Relative: 9 %
Neutro Abs: 5.6 10*3/uL (ref 1.7–7.7)
Neutrophils Relative %: 62 %
Platelets: 293 10*3/uL (ref 150–400)
RBC: 4.72 MIL/uL (ref 4.22–5.81)
RDW: 13.2 % (ref 11.5–15.5)
WBC: 9.1 10*3/uL (ref 4.0–10.5)
nRBC: 0 % (ref 0.0–0.2)

## 2023-08-08 LAB — C-REACTIVE PROTEIN: CRP: 1.8 mg/dL — ABNORMAL HIGH (ref <1.0)

## 2023-08-08 LAB — BRAIN NATRIURETIC PEPTIDE: B Natriuretic Peptide: 185.3 pg/mL — ABNORMAL HIGH (ref 0.0–100.0)

## 2023-08-08 LAB — TROPONIN I (HIGH SENSITIVITY): Troponin I (High Sensitivity): 5 ng/L (ref <18)

## 2023-08-08 LAB — LACTIC ACID, PLASMA: Lactic Acid, Venous: 1.9 mmol/L (ref 0.5–1.9)

## 2023-08-08 LAB — SEDIMENTATION RATE: Sed Rate: 5 mm/h (ref 0–20)

## 2023-08-08 MED ORDER — DIPHENHYDRAMINE HCL 25 MG PO CAPS
25.0000 mg | ORAL_CAPSULE | Freq: Three times a day (TID) | ORAL | Status: DC | PRN
  Administered 2023-08-09: 25 mg via ORAL
  Filled 2023-08-08: qty 1

## 2023-08-08 MED ORDER — ONDANSETRON HCL 4 MG/2ML IJ SOLN: 4.0000 mg | Freq: Three times a day (TID) | INTRAMUSCULAR | Status: DC | PRN

## 2023-08-08 MED ORDER — HYDRALAZINE HCL 20 MG/ML IJ SOLN: 5.0000 mg | INTRAMUSCULAR | Status: DC | PRN

## 2023-08-08 MED ORDER — FUROSEMIDE 10 MG/ML IJ SOLN
40.0000 mg | Freq: Two times a day (BID) | INTRAMUSCULAR | Status: DC
  Administered 2023-08-09: 40 mg via INTRAVENOUS
  Filled 2023-08-08: qty 4

## 2023-08-08 MED ORDER — ALBUTEROL SULFATE (2.5 MG/3ML) 0.083% IN NEBU: 2.5000 mg | INHALATION_SOLUTION | RESPIRATORY_TRACT | Status: DC | PRN

## 2023-08-08 MED ORDER — ALBUTEROL SULFATE HFA 108 (90 BASE) MCG/ACT IN AERS: 2.0000 | INHALATION_SPRAY | RESPIRATORY_TRACT | Status: DC | PRN

## 2023-08-08 MED ORDER — ENOXAPARIN SODIUM 60 MG/0.6ML IJ SOSY
50.0000 mg | PREFILLED_SYRINGE | INTRAMUSCULAR | Status: DC
  Filled 2023-08-08: qty 0.6

## 2023-08-08 MED ORDER — DM-GUAIFENESIN ER 30-600 MG PO TB12
1.0000 | ORAL_TABLET | Freq: Two times a day (BID) | ORAL | Status: DC | PRN
Start: 2023-08-08 — End: 2023-08-10

## 2023-08-08 MED ORDER — FUROSEMIDE 10 MG/ML IJ SOLN
40.0000 mg | Freq: Once | INTRAMUSCULAR | Status: AC
  Administered 2023-08-08: 40 mg via INTRAVENOUS
  Filled 2023-08-08: qty 4

## 2023-08-08 NOTE — ED Notes (Signed)
 Called to [facility] @904pm  per MD Tan/Rep Elita Quick.

## 2023-08-08 NOTE — Progress Notes (Deleted)
 There were no vitals taken for this visit.   Subjective:    Patient ID: Charles Bryant, male    DOB: 06-03-1955, 68 y.o.   MRN: [id number]  HPI: Charles Bryant is a 68 y.o. male presenting on 08/09/2023 for comprehensive medical examination. Current medical complaints include:{Blank single:19197::"none","***"}  He currently lives with: Interim Problems from his last visit: {Blank single:19197::"yes","no"}  HYPERTENSION / HYPERLIPIDEMIA Satisfied with current treatment? no Duration of hypertension: years BP monitoring frequency: daily BP range: 120-130/70-80 BP medication side effects: no Past BP meds:  amlodipine, quinapril, HCTZ, Metoprolol Duration of hyperlipidemia: years Cholesterol medication side effects: no Cholesterol supplements: none Past cholesterol medications: atorvastain (lipitor) Medication compliance: excellent compliance Aspirin: yes Recent stressors: no Recurrent headaches: no Visual changes: no Palpitations: no Dyspnea: no Chest pain: no Lower extremity edema: no Dizzy/lightheaded: no   COPD COPD status: controlled Satisfied with current treatment?: yes Oxygen use: no Dyspnea frequency:  Cough frequency:  Rescue inhaler frequency:   Limitation of activity: no Productive cough:  Last Spirometry:  Pneumovax: Up to Date Influenza: Up to Date  Depression Screen done today and results listed below:     06/01/2023   10:59 AM 02/08/2023    8:38 AM 07/06/2022    8:47 AM 07/02/2022    8:15 AM 12/30/2021    8:10 AM  Depression screen PHQ 2/9  Decreased Interest 0 0 0 0 0  Down, Depressed, Hopeless 0 0 0 0 0  PHQ - 2 Score 0 0 0 0 0  Altered sleeping 1 0 0 1 0  Tired, decreased energy 0 0 0 0 0  Change in appetite 0 0 0 0 0  Feeling bad or failure about yourself  0 0 0 0 0  Trouble concentrating 0 0 0 0 0  Moving slowly or fidgety/restless 0 0 0 0 0  Suicidal thoughts 0 0 0 0 0  PHQ-9 Score 1 0 0 1 0  Difficult doing work/chores  Not difficult at all Not  difficult at all Not difficult at all Not difficult at all    The patient {has/does not WUJW:11914} a history of falls. I {did/did not:19850} complete a risk assessment for falls. A plan of care for falls {was/was not:19852} documented.   Past Medical History:  Past Medical History:  Diagnosis Date   Advanced care planning/counseling discussion 07/02/2022   Allergy Childhood   Asthma    COPD (chronic obstructive pulmonary disease) (HCC)    COPD (chronic obstructive pulmonary disease) (HCC)    GERD (gastroesophageal reflux disease)    Hyperlipidemia    Hypertension     Surgical History:  Past Surgical History:  Procedure Laterality Date   CARDIOVERSION N/A 07/13/2023   Procedure: CARDIOVERSION;  Surgeon: Thomasene Ripple, DO;  Location: [facility];  Service: Cardiovascular;  Laterality: N/A;   carotid surgery  2009   CATARACT EXTRACTION Bilateral 09/2021   EYE SURGERY  6 years ago   Blepharoplasty    Medications:  Current Outpatient Medications on File Prior to Visit  Medication Sig   amLODipine (NORVASC) 10 MG tablet Take 1 tablet (10 mg total) by mouth daily.   atorvastatin (LIPITOR) 40 MG tablet TAKE 1 TABLET BY MOUTH DAILY   dabigatran (PRADAXA) 150 MG CAPS capsule TAKE 1 CAPSULE BY MOUTH TWICE A DAY   fluticasone-salmeterol (WIXELA INHUB) 500-50 MCG/ACT AEPB USE 1 INHALATION BY MOUTH  TWICE DAILY   furosemide (LASIX) 20 MG tablet Take 1 tablet (20 mg total) by mouth daily  as needed (swelling/weight gain).   ipratropium-albuterol (DUONEB) 0.5-2.5 (3) MG/3ML SOLN Take 3 mLs by nebulization every 6 (six) hours as needed.   lisinopril (ZESTRIL) 20 MG tablet Take 1 tablet (20 mg total) by mouth daily.   metoprolol succinate (TOPROL-XL) 50 MG 24 hr tablet TAKE 1 TABLET BY MOUTH DAILY  WITH OR IMMEDIATELY FOLLOWING A  MEAL   omeprazole (PRILOSEC) 40 MG capsule Take 1 capsule (40 mg total) by mouth daily.   quinapril (ACCUPRIL) 10 MG tablet Take 10 mg by mouth daily.    tadalafil (CIALIS) 20 MG tablet Take 1 tablet (20 mg total) by mouth daily as needed for erectile dysfunction.   VENTOLIN HFA 108 (90 Base) MCG/ACT inhaler USE 2 INHALATIONS BY MOUTH EVERY 6 HOURS AS NEEDED FOR WHEEZING  OR SHORTNESS OF BREATH   No current facility-administered medications on file prior to visit.    Allergies:  Allergies  Allergen Reactions   Eliquis [Apixaban] Hives    Social History:  Social History   Socioeconomic History   Marital status: Married    Spouse name: Not on file   Number of children: Not on file   Years of education: Not on file   Highest education level: Professional school degree (e.g., MD, DDS, DVM, JD)  Occupational History   Not on file  Tobacco Use   Smoking status: Former   Smokeless tobacco: Never   Tobacco comments:    Former smoker 07/20/23  Vaping Use   Vaping status: Never Used  Substance and Sexual Activity   Alcohol use: Yes    Alcohol/week: 24.0 standard drinks of alcohol    Types: 14 Cans of beer, 10 Shots of liquor per week    Comment: 2 beers depending on week 07/20/23   Drug use: Never   Sexual activity: Yes  Other Topics Concern   Not on file  Social History Narrative   Not on file   Social Drivers of Health   Financial Resource Strain: Low Risk  (05/31/2023)   Overall Financial Resource Strain (CARDIA)    Difficulty of Paying Living Expenses: Not hard at all  Food Insecurity: No Food Insecurity (05/31/2023)   Hunger Vital Sign    Worried About Running Out of Food in the Last Year: Never true    Ran Out of Food in the Last Year: Never true  Transportation Needs: No Transportation Needs (05/31/2023)   PRAPARE - Administrator, Civil Service (Medical): No    Lack of Transportation (Non-Medical): No  Physical Activity: Unknown (05/31/2023)   Exercise Vital Sign    Days of Exercise per Week: 0 days    Minutes of Exercise per Session: Not on file  Recent Concern: Physical Activity - Inactive (05/31/2023)    Exercise Vital Sign    Days of Exercise per Week: 0 days    Minutes of Exercise per Session: 0 min  Stress: No Stress Concern Present (05/31/2023)   [employer] of Occupational Health - Occupational Stress Questionnaire    Feeling of Stress : Not at all  Social Connections: Moderately Integrated (05/31/2023)   Social Connection and Isolation Panel [NHANES]    Frequency of Communication with Friends and Family: Twice a week    Frequency of Social Gatherings with Friends and Family: Once a week    Attends Religious Services: Never    Database administrator or Organizations: Yes    Attends Banker Meetings: 1 to 4 times per year  Marital Status: Married  Catering manager Violence: Not At Risk (07/06/2022)   Humiliation, Afraid, Rape, and Kick questionnaire    Fear of Current or Ex-Partner: No    Emotionally Abused: No    Physically Abused: No    Sexually Abused: No   Social History   Tobacco Use  Smoking Status Former  Smokeless Tobacco Never  Tobacco Comments   Former smoker 07/20/23   Social History   Substance and Sexual Activity  Alcohol Use Yes   Alcohol/week: 24.0 standard drinks of alcohol   Types: 14 Cans of beer, 10 Shots of liquor per week   Comment: 2 beers depending on week 07/20/23    Family History:  Family History  Problem Relation Age of Onset   COPD Mother    Cancer Father    Heart disease Maternal Grandmother    Stroke Maternal Grandfather    Hyperkalemia Maternal Grandfather     Past medical history, surgical history, medications, allergies, family history and social history reviewed with patient today and changes made to appropriate areas of the chart.   ROS All other ROS negative except what is listed above and in the HPI.      Objective:    There were no vitals taken for this visit.  Wt Readings from Last 3 Encounters:  07/20/23 222 lb 9.6 oz (101 kg)  07/13/23 217 lb (98.4 kg)  07/08/23 219 lb (99.3 kg)    Physical  Exam  Results for orders placed or performed during the hospital encounter of 08/08/23  CBC with Differential   Collection Time: 08/08/23  3:19 PM  Result Value Ref Range   WBC 9.1 4.0 - 10.5 K/uL   RBC 4.72 4.22 - 5.81 MIL/uL   Hemoglobin 14.9 13.0 - 17.0 g/dL   HCT 14.7 82.9 - 56.2 %   MCV 93.4 80.0 - 100.0 fL   MCH 31.6 26.0 - 34.0 pg   MCHC 33.8 30.0 - 36.0 g/dL   RDW 13.0 86.5 - 78.4 %   Platelets 293 150 - 400 K/uL   nRBC 0.0 0.0 - 0.2 %   Neutrophils Relative % 62 %   Neutro Abs 5.6 1.7 - 7.7 K/uL   Lymphocytes Relative 17 %   Lymphs Abs 1.6 0.7 - 4.0 K/uL   Monocytes Relative 9 %   Monocytes Absolute 0.8 0.1 - 1.0 K/uL   Eosinophils Relative 11 %   Eosinophils Absolute 1.0 (H) 0.0 - 0.5 K/uL   Basophils Relative 1 %   Basophils Absolute 0.1 0.0 - 0.1 K/uL   Immature Granulocytes 0 %   Abs Immature Granulocytes 0.04 0.00 - 0.07 K/uL  Basic metabolic panel   Collection Time: 08/08/23  3:19 PM  Result Value Ref Range   Sodium 133 (L) 135 - 145 mmol/L   Potassium 4.1 3.5 - 5.1 mmol/L   Chloride 96 (L) 98 - 111 mmol/L   CO2 27 22 - 32 mmol/L   Glucose, Bld 108 (H) 70 - 99 mg/dL   BUN 16 8 - 23 mg/dL   Creatinine, Ser 6.96 0.61 - 1.24 mg/dL   Calcium 8.9 8.9 - 29.5 mg/dL   GFR, Estimated >28 >41 mL/min   Anion gap 10 5 - 15      Assessment & Plan:   Problem List Items Addressed This Visit   None    Discussed aspirin prophylaxis for myocardial infarction prevention and decision was {Blank single:19197::"it was not indicated","made to continue ASA","made to start ASA","made to stop  ASA","that we recommended ASA, and patient refused"}  LABORATORY TESTING:  Health maintenance labs ordered today as discussed above.   The natural history of prostate cancer and ongoing controversy regarding screening and potential treatment outcomes of prostate cancer has been discussed with the patient. The meaning of a false positive PSA and a false negative PSA has been discussed.  He indicates understanding of the limitations of this screening test and wishes *** to proceed with screening PSA testing.   IMMUNIZATIONS:   - Tdap: Tetanus vaccination status reviewed: {tetanus status:315746}. - Influenza: {Blank single:19197::"Up to date","Administered today","Postponed to flu season","Refused","Given elsewhere"} - Pneumovax: {Blank single:19197::"Up to date","Administered today","Not applicable","Refused","Given elsewhere"} - Prevnar: {Blank single:19197::"Up to date","Administered today","Not applicable","Refused","Given elsewhere"} - COVID: {Blank single:19197::"Up to date","Administered today","Not applicable","Refused","Given elsewhere"} - HPV: {Blank single:19197::"Up to date","Administered today","Not applicable","Refused","Given elsewhere"} - Shingrix vaccine: {Blank single:19197::"Up to date","Administered today","Not applicable","Refused","Given elsewhere"}  SCREENING: - Colonoscopy: {Blank single:19197::"Up to date","Ordered today","Not applicable","Refused","Done elsewhere"}  Discussed with patient purpose of the colonoscopy is to detect colon cancer at curable precancerous or early stages   - AAA Screening: {Blank single:19197::"Up to date","Ordered today","Not applicable","Refused","Done elsewhere"}  -Hearing Test: {Blank single:19197::"Up to date","Ordered today","Not applicable","Refused","Done elsewhere"}  -Spirometry: {Blank single:19197::"Up to date","Ordered today","Not applicable","Refused","Done elsewhere"}   PATIENT COUNSELING:    Sexuality: Discussed sexually transmitted diseases, partner selection, use of condoms, avoidance of unintended pregnancy  and contraceptive alternatives.   Advised to avoid cigarette smoking.  I discussed with the patient that most people either abstain from alcohol or drink within safe limits (<=14/week and <=4 drinks/occasion for males, <=7/weeks and <= 3 drinks/occasion for females) and that the risk for alcohol  disorders and other health effects rises proportionally with the number of drinks per week and how often a drinker exceeds daily limits.  Discussed cessation/primary prevention of drug use and availability of treatment for abuse.   Diet: Encouraged to adjust caloric intake to maintain  or achieve ideal body weight, to reduce intake of dietary saturated fat and total fat, to limit sodium intake by avoiding high sodium foods and not adding table salt, and to maintain adequate dietary potassium and calcium preferably from fresh fruits, vegetables, and low-fat dairy products.    stressed the importance of regular exercise  Injury prevention: Discussed safety belts, safety helmets, smoke detector, smoking near bedding or upholstery.   Dental health: Discussed importance of regular tooth brushing, flossing, and dental visits.   Follow up plan: NEXT PREVENTATIVE PHYSICAL DUE IN 1 YEAR. No follow-ups on file.

## 2023-08-08 NOTE — ED Provider Notes (Signed)
 [facility] Provider Note    Event Date/Time   First MD Initiated Contact with Patient 08/08/23 1736     (approximate)   History   Allergic Reaction   HPI  Charles Bryant is a 68 y.o. male with history of COPD, hyperlipidemia, hypertension, CHF, A-fib on Pradaxa presenting with dyspnea on exertion as well as a rash to his body.  Patient states that he was on Eliquis for about 6 weeks, started developing an itchy rash.  Rash started on his abdomen, and migrated to his back as well as to the rest of extremities.  He denies any intraoral lesions.  States that the lesions are itchy.  He spoke to his cardiologist thought that this might be an allergy to Eliquis and he was switched over to Pradaxa and started that medication 3 weeks ago.  Wife states that she noted that he started to get more swollen to his extremities, seem to have some dyspnea on exertion.  Also noted that he has some blisters to left arm and bilateral lower extremities that have dried up.  He notes some mild tenderness to his anterior shins bilaterally.  No fevers, chest pain.  States that he had been taking Zyrtec for the rash.  He denies any history of rheumatological or immunological issues.  Independent history obtained from wife as above.  On independent chart review, he was seen by his cardiologist in mid March, had an echo done in February that showed an EF of 60 to 65%, has history of persistent atrial fibrillation, is on Lasix 20 mg as needed for swelling, is opposed to be on Pradaxa twice daily.  There was a discussion about starting him on dofetilide.     Physical Exam   Triage Vital Signs: ED Triage Vitals  Encounter Vitals Group     BP 08/08/23 1508 118/81     Systolic BP Percentile --      Diastolic BP Percentile --      Pulse Rate 08/08/23 1508 84     Resp 08/08/23 1508 18     Temp 08/08/23 1508 98.3 F (36.8 C)     Temp Source 08/08/23 1508 Oral     SpO2 08/08/23 1508 97 %      Weight --      Height --      Head Circumference --      Peak Flow --      Pain Score 08/08/23 1519 3     Pain Loc --      Pain Education --      Exclude from Growth Chart --     Most recent vital signs: Vitals:   08/08/23 1830 08/08/23 1844  BP: (!) 136/94   Pulse: (!) 101 92  Resp: 19   Temp:  98.1 F (36.7 C)  SpO2: 100%      General: Awake, no distress.  CV:  Good peripheral perfusion.  Resp:  Normal effort.  No respiratory distress or increased work of breathing Abd:  No distention.  Abdomen soft nontender Other:  No intraoral lesions, no intraoral swelling.  He has erythematous scaling rash to his abdomen, lower back, extremities.  Rash is blanching, does coalesce especially on the abdomen as well as his left upper extremity and bilateral lower extremities.  No obvious blisters.  He does have bilateral lower extremity edema that appears symmetrical.               ED Results / Procedures /  Treatments   Labs (all labs ordered are listed, but only abnormal results are displayed) Labs Reviewed  CBC WITH DIFFERENTIAL/PLATELET - Abnormal; Notable for the following components:      Result Value   Eosinophils Absolute 1.0 (*)    All other components within normal limits  BASIC METABOLIC PANEL WITH GFR - Abnormal; Notable for the following components:   Sodium 133 (*)    Chloride 96 (*)    Glucose, Bld 108 (*)    All other components within normal limits  BRAIN NATRIURETIC PEPTIDE - Abnormal; Notable for the following components:   B Natriuretic Peptide 185.3 (*)    All other components within normal limits  LACTIC ACID, PLASMA  SEDIMENTATION RATE  C-REACTIVE PROTEIN  TROPONIN I (HIGH SENSITIVITY)     EKG  EKG shows atrial fibrillation, rate 97, normal QRS, normal QTc, T wave flattening in 3, T wave inversion to V2, V3, no ischemic ST elevation, T wave changes are new compared to prior   RADIOLOGY Chest x-ray on my independent interpretation without  obvious consolidation   PROCEDURES:  Critical Care performed: No  Procedures   MEDICATIONS ORDERED IN ED: Medications  furosemide (LASIX) injection 40 mg (40 mg Intravenous Given 08/08/23 1842)     IMPRESSION / MDM / ASSESSMENT AND PLAN / ED COURSE  I reviewed the triage vital signs and the nursing notes.                              Differential diagnosis includes, but is not limited to, CHF, volume overload, allergic reaction, drug rash, considered vasculitis but lesions are blanching, consider Stevens-Johnson's, patient has no intraoral lesions, is nontoxic-appearing.  No evidence of anaphylaxis at this time.  Get labs, ESR, CRP, lactic acid, troponin, BNP, chest x-ray.  Will give him 40 mg of IV Lasix here given that he appears volume overloaded.  He will likely need to be admitted for further management and workup of his rash.  Patient's presentation is most consistent with acute presentation with potential threat to life or bodily function.  Independent review of labs imaging are below.  Clinical course as below.  Spoke to hospitalist after discussing with [facility] and he is agreeable plan for admission.  Patient is admitted.  Clinical Course as of 08/08/23 2227  Mon Aug 08, 2023  1832 DG Chest 2 View IMPRESSION: 1. No acute findings. 2. Aortic Atherosclerosis (ICD10-I70.0)   [TT]  2201 Consult to hospitalist who is concern for Stevens-Johnson syndrome, no Derm here, requesting Derm consult with [facility] to decide if he needs to be transferred. [TT]  2201 Reached out to [facility] transfer center, they will reach out to dermatology to see if patient warrants a transfer. [TT]  2225 Discussed with dermatology at [facility], they do not think that this is SJS and states that patient does not warrant transfer at this time given that he has had this rash for a while, vitals are stable, not hypoxic, rash is not incredibly tender, his labs are stable. [TT]     Clinical Course User Index [TT] Jodie Echevaria, Franchot Erichsen, MD     FINAL CLINICAL IMPRESSION(S) / ED DIAGNOSES   Final diagnoses:  Hypervolemia, unspecified hypervolemia type  DOE (dyspnea on exertion)  Rash     Rx / DC Orders   ED Discharge Orders     None        Note:  This  document was prepared using Conservation officer, historic buildings and may include unintentional dictation errors.    Claybon Jabs, MD 08/08/23 2227

## 2023-08-08 NOTE — ED Provider Triage Note (Signed)
 Emergency Medicine Provider Triage Evaluation Note  Charles Bryant , a 68 y.o. male  was evaluated in triage.  Pt complains of allergic reaction. Patient was started on eliquis for afib 2 months ago. Developed an allergic reaction to this about a month and half ago, recommended he keep taking the medication and treat it with allergy meds. 3 weeks ago, decided to take him off the medication as the reaction was getting worse. Symptoms never resolved, he is having leg swelling and erythema which is what brought him into the ED. Initial allergic reaction was hives, which then blistered and had some sersanguanous drained.   Patient was switched from eliquis to pradaxa.   Pts wife is a Engineer, civil (consulting) and she is concerned he has developed a vasculitis.   Review of Systems  Positive: Leg swelling, leg pain, fever, SOB Negative: Lip/tongue swelling  Physical Exam  BP 118/81 (BP Location: Right Arm)   Pulse 84   Temp 98.3 F (36.8 C) (Oral)   Resp 18   SpO2 97%  Gen:   Awake, no distress   Resp:  Normal effort  MSK:   Moves extremities without difficulty  Other:  Rash across abdomen, chest and back, bilateral lower leg swelling with scaly patches of scabbing and some areas of drainage  Medical Decision Making  Medically screening exam initiated at 3:10 PM.  Appropriate orders placed.  Charles Bryant was informed that the remainder of the evaluation will be completed by another provider, this initial triage assessment does not replace that evaluation, and the importance of remaining in the ED until their evaluation is complete.    Cameron Ali, PA-C 08/08/23 1519

## 2023-08-08 NOTE — Progress Notes (Signed)
 PHARMACIST - PHYSICIAN COMMUNICATION  CONCERNING:  Enoxaparin (Lovenox) for DVT Prophylaxis    RECOMMENDATION: Patient was prescribed enoxaprin 40mg  q24 hours for VTE prophylaxis.   There were no vitals filed for this visit.  There is no height or weight on file to calculate BMI.  CrCl cannot be calculated (Unknown ideal weight.).   Based on [facility] policy patient is candidate for enoxaparin 0.5mg /kg TBW SQ every 24 hours based on BMI being >30.  DESCRIPTION: Pharmacy has adjusted enoxaparin dose per [facility] policy.  Patient is now receiving enoxaparin 0.5 mg/kg every 24 hours based on pt wt of 98.6 kg taken from O/V note on 06/27/23.    Otelia Sergeant, PharmD, [facility] 08/08/2023 10:42 PM

## 2023-08-08 NOTE — H&P (Signed)
 History and Physical    Charles Bryant ZOX:[id number] DOB: 1955/07/05 DOA: 08/08/2023  Referring MD/NP/PA:   PCP: Larae Grooms, NP   Patient coming from:  The patient is coming from home.     Chief Complaint: rash  HPI: Charles Bryant is a 68 y.o. male with medical history significant of former smoker, COPD/asthma, HLD, GERD, A fib, AAA, carotid artery stenosis.   Patient states that because of her A-fib, his cardiologist started him on Eliquis 6 weeks ago.  About 5 days after starting taking Eliquis, he developed rash. The rashes started on his abdomen, then progressed to have involved bilateral arm, bilateral leg and back. He denies any intraoral lesions. The rash is itchy. He has some blisters to left arm and bilateral lower extremities that have dried up. He also has swelling in both arms (the left arm is worse than the left), and aching both legs. He states that he has been taking Zyrtec for the rash. His cardiologist switched Eliquis to Pradaxa for him 3 weeks ago. He sates that after started taking Pradaxa, his rash has not been getting worse, but also no significant improvement.  He has exertional shortness of breath, no cough, chest pain.  No fever or chills.  No nausea, vomiting, diarrhea or abdominal pain.  No symptoms of UTI.  He denies any history of rheumatological or immunological issues.   Data reviewed independently and ED Course: pt was found to have BNP 185, GFR> 60, troponin 5, lactic acid of 1.9, ESR 5, pending CRP. CXR negative.    EKG: I have personally reviewed.  A-fib, QTc 436, low voltage.   Review of Systems:   General: no fevers, chills, no body weight gain, has poor appetite, has fatigue HEENT: no blurry vision, hearing changes or sore throat Respiratory: no dyspnea, coughing, wheezing CV: no chest pain, no palpitations GI: no nausea, vomiting, abdominal pain, diarrhea, constipation GU: no dysuria, burning on urination, increased urinary frequency, hematuria   Ext: no leg edema Neuro: no unilateral weakness, numbness, or tingling, no vision change or hearing loss Skin: no rash, no skin tear. MSK: No muscle spasm, no deformity, no limitation of range of movement in spin Heme: No easy bruising.  Travel history: No recent long distant travel.   Allergy:  Allergies  Allergen Reactions   Eliquis [Apixaban] Hives    Past Medical History:  Diagnosis Date   Advanced care planning/counseling discussion 07/02/2022   Allergy Childhood   Asthma    COPD (chronic obstructive pulmonary disease) (HCC)    COPD (chronic obstructive pulmonary disease) (HCC)    GERD (gastroesophageal reflux disease)    Hyperlipidemia    Hypertension     Past Surgical History:  Procedure Laterality Date   CARDIOVERSION N/A 07/13/2023   Procedure: CARDIOVERSION;  Surgeon: Thomasene Ripple, DO;  Location: [facility];  Service: Cardiovascular;  Laterality: N/A;   carotid surgery  2009   CATARACT EXTRACTION Bilateral 09/2021   EYE SURGERY  6 years ago   Blepharoplasty    Social History:  reports that he has quit smoking. He has never used smokeless tobacco. He reports current alcohol use of about 24.0 standard drinks of alcohol per week. He reports that he does not use drugs.  Family History:  Family History  Problem Relation Age of Onset   COPD Mother    Cancer Father    Heart disease Maternal Grandmother    Stroke Maternal Grandfather    Hyperkalemia Maternal Grandfather  Prior to Admission medications   Medication Sig Start Date End Date Taking? Authorizing Provider  amLODipine (NORVASC) 10 MG tablet Take 1 tablet (10 mg total) by mouth daily. 07/14/23   Larae Grooms, NP  atorvastatin (LIPITOR) 40 MG tablet TAKE 1 TABLET BY MOUTH DAILY 03/02/23   Runell Gess, MD  dabigatran (PRADAXA) 150 MG CAPS capsule TAKE 1 CAPSULE BY MOUTH TWICE A DAY 07/20/23   Runell Gess, MD  fluticasone-salmeterol [facility] INHUB) 500-50 MCG/ACT AEPB USE 1  INHALATION BY MOUTH  TWICE DAILY 02/08/23   Pearley, Sherran Needs, NP  furosemide (LASIX) 20 MG tablet Take 1 tablet (20 mg total) by mouth daily as needed (swelling/weight gain). 07/20/23   Fenton, Clint R, PA  ipratropium-albuterol (DUONEB) 0.5-2.5 (3) MG/3ML SOLN Take 3 mLs by nebulization every 6 (six) hours as needed. 04/13/23   Larae Grooms, NP  lisinopril (ZESTRIL) 20 MG tablet Take 1 tablet (20 mg total) by mouth daily. 07/07/23   Larae Grooms, NP  metoprolol succinate (TOPROL-XL) 50 MG 24 hr tablet TAKE 1 TABLET BY MOUTH DAILY  WITH OR IMMEDIATELY FOLLOWING A  MEAL 07/18/23   Larae Grooms, NP  omeprazole (PRILOSEC) 40 MG capsule Take 1 capsule (40 mg total) by mouth daily. 02/08/23   Pearley, Sherran Needs, NP  quinapril (ACCUPRIL) 10 MG tablet Take 10 mg by mouth daily.    [provider]  tadalafil (CIALIS) 20 MG tablet Take 1 tablet (20 mg total) by mouth daily as needed for erectile dysfunction. 12/18/19   Particia Nearing, PA-C  VENTOLIN HFA 108 [phone] Base) MCG/ACT inhaler USE 2 INHALATIONS BY MOUTH EVERY 6 HOURS AS NEEDED FOR WHEEZING  OR SHORTNESS OF BREATH 04/19/23   Weber Cooks, NP    Physical Exam: Vitals:   08/08/23 1508 08/08/23 1820 08/08/23 1830 08/08/23 1844  BP: 118/81  (!) 136/94   Pulse: 84  (!) 101 92  Resp: 18  19   Temp: 98.3 F (36.8 C)   98.1 F (36.7 C)  TempSrc: Oral   Oral  SpO2: 97% 100% 100%    General: Not in acute distress HEENT:       Eyes: PERRL, EOMI, no jaundice       ENT: No discharge from the ears and nose, no pharynx injection, no tonsillar enlargement.        Neck: No JVD, no bruit, no mass felt. Heme: No neck lymph node enlargement. Cardiac: S1/S2, RRR, No murmurs, No gallops or rubs. Respiratory: No rales, wheezing, rhonchi or rubs. GI: Soft, nondistended, nontender, no rebound pain, no organomegaly, BS present. GU: No hematuria Ext: No pitting leg edema bilaterally. 1+DP/PT pulse  bilaterally. Musculoskeletal: No joint deformities, No joint redness or warmth, no limitation of ROM in spin. Skin: No rashes.  Neuro: Alert, oriented X3, cranial nerves II-XII grossly intact, moves all extremities normally. Muscle strength 5/5 in all extremities, sensation to light touch intact. Brachial reflex 2+ bilaterally. Knee reflex 1+ bilaterally. Negative Babinski's sign. Normal finger to nose test. Psych: Patient is not psychotic, no suicidal or hemocidal ideation.  Labs on Admission: I have personally reviewed following labs and imaging studies  CBC: Recent Labs  Lab 08/08/23 1519  WBC 9.1  NEUTROABS 5.6  HGB 14.9  HCT 44.1  MCV 93.4  PLT 293   Basic Metabolic Panel: Recent Labs  Lab 08/08/23 1519  NA 133*  K 4.1  CL 96*  CO2 27  GLUCOSE 108*  BUN 16  CREATININE 0.89  CALCIUM 8.9   GFR: CrCl cannot be calculated (Unknown ideal weight.). Liver Function Tests: No results for input(s): "AST", "ALT", "ALKPHOS", "BILITOT", "PROT", "ALBUMIN" in the last 168 hours. No results for input(s): "LIPASE", "AMYLASE" in the last 168 hours. No results for input(s): "AMMONIA" in the last 168 hours. Coagulation Profile: No results for input(s): "INR", "PROTIME" in the last 168 hours. Cardiac Enzymes: No results for input(s): "CKTOTAL", "CKMB", "CKMBINDEX", "TROPONINI" in the last 168 hours. BNP (last 3 results) No results for input(s): "PROBNP" in the last 8760 hours. HbA1C: No results for input(s): "HGBA1C" in the last 72 hours. CBG: No results for input(s): "GLUCAP" in the last 168 hours. Lipid Profile: No results for input(s): "CHOL", "HDL", "LDLCALC", "TRIG", "CHOLHDL", "LDLDIRECT" in the last 72 hours. Thyroid Function Tests: No results for input(s): "TSH", "T4TOTAL", "FREET4", "T3FREE", "THYROIDAB" in the last 72 hours. Anemia Panel: No results for input(s): "VITAMINB12", "FOLATE", "FERRITIN", "TIBC", "IRON", "RETICCTPCT" in the last 72 hours. Urine analysis:     Component Value Date/Time   APPEARANCEUR Clear 12/30/2021 0841   GLUCOSEU Negative 12/30/2021 0841   BILIRUBINUR Negative 12/30/2021 0841   PROTEINUR Negative 12/30/2021 0841   NITRITE Negative 12/30/2021 0841   LEUKOCYTESUR Negative 12/30/2021 0841   Sepsis Labs: @LABRCNTIP (procalcitonin:4,lacticidven:4) )No results found for this or any previous visit (from the past 240 hours).   Radiological Exams on Admission:   Assessment/Plan Principal Problem:   Rash Active Problems:   SOB (shortness of breath)   Hyperlipidemia   COPD (chronic obstructive pulmonary disease) (HCC)   Asthma   Essential hypertension   Atrial fibrillation, chronic (HCC)   Obesity (BMI 30-39.9)   Assessment and Plan: No notes have been filed under this hospital service. Service: Hospitalist      Principal Problem:   Rash Active Problems:   SOB (shortness of breath)   Hyperlipidemia   COPD (chronic obstructive pulmonary disease) (HCC)   Asthma   Essential hypertension   Atrial fibrillation, chronic (HCC)   Obesity (BMI 30-39.9)    DVT ppx: SQ Heparin         SQ Lovenox  Code Status: Full code   ***  Family Communication:     not done, no family member is at bed side.              Yes, patient's    at bed side.       by phone   ***  Disposition Plan:  Anticipate discharge back to previous environment  Consults called:    Admission status and Level of care: Telemetry Cardiac:    for obs as inpt        Dispo: The patient is from: {From:23814}              Anticipated d/c is to: {To:23815}              Anticipated d/c date is: {Days:23816}              Patient currently {Medically stable:23817}    Severity of Illness:  {Observation/Inpatient:21159}       Date of Service 08/08/2023    Lorretta Harp [facility]   If 7PM-7AM, please contact night-coverage [email or web address] 08/08/2023, 10:34 PM

## 2023-08-08 NOTE — ED Triage Notes (Addendum)
 Pt to ED via POV from home. Pt reports allergic rxn to Eliquis that started 2 months ago. Pt reports has been having hives but doctor wanted pt to stay on medication and has been taking zyrtec. Pt switched 3wks ago to pradaxa and is still hives with blistering with drainage and SOB.   PA in triage to place orders

## 2023-08-09 ENCOUNTER — Ambulatory Visit: Payer: Medicare Other | Admitting: Nurse Practitioner

## 2023-08-09 ENCOUNTER — Encounter: Payer: Self-pay | Admitting: Internal Medicine

## 2023-08-09 DIAGNOSIS — I482 Chronic atrial fibrillation, unspecified: Secondary | ICD-10-CM

## 2023-08-09 DIAGNOSIS — Z7189 Other specified counseling: Secondary | ICD-10-CM

## 2023-08-09 DIAGNOSIS — I10 Essential (primary) hypertension: Secondary | ICD-10-CM

## 2023-08-09 DIAGNOSIS — J432 Centrilobular emphysema: Secondary | ICD-10-CM

## 2023-08-09 DIAGNOSIS — I6521 Occlusion and stenosis of right carotid artery: Secondary | ICD-10-CM

## 2023-08-09 DIAGNOSIS — D6869 Other thrombophilia: Secondary | ICD-10-CM

## 2023-08-09 DIAGNOSIS — E782 Mixed hyperlipidemia: Secondary | ICD-10-CM

## 2023-08-09 DIAGNOSIS — R21 Rash and other nonspecific skin eruption: Secondary | ICD-10-CM | POA: Diagnosis not present

## 2023-08-09 DIAGNOSIS — I714 Abdominal aortic aneurysm, without rupture, unspecified: Secondary | ICD-10-CM

## 2023-08-09 DIAGNOSIS — Z0000 Encounter for general adult medical examination without abnormal findings: Secondary | ICD-10-CM

## 2023-08-09 LAB — BASIC METABOLIC PANEL WITH GFR
Anion gap: 12 (ref 5–15)
BUN: 16 mg/dL (ref 8–23)
CO2: 26 mmol/L (ref 22–32)
Calcium: 8.9 mg/dL (ref 8.9–10.3)
Chloride: 96 mmol/L — ABNORMAL LOW (ref 98–111)
Creatinine, Ser: 0.86 mg/dL (ref 0.61–1.24)
GFR, Estimated: >60 mL/min (ref >60)
Glucose, Bld: 103 mg/dL — ABNORMAL HIGH (ref 70–99)
Potassium: 3.4 mmol/L — ABNORMAL LOW (ref 3.5–5.1)
Sodium: 134 mmol/L — ABNORMAL LOW (ref 135–145)

## 2023-08-09 LAB — MAGNESIUM: Magnesium: 2.2 mg/dL (ref 1.7–2.4)

## 2023-08-09 LAB — HIV ANTIBODY (ROUTINE TESTING W REFLEX): HIV Screen 4th Generation wRfx: NONREACTIVE

## 2023-08-09 LAB — CBC
HCT: 40.7 % (ref 39.0–52.0)
Hemoglobin: 14.2 g/dL (ref 13.0–17.0)
MCH: 31.7 pg (ref 26.0–34.0)
MCHC: 34.9 g/dL (ref 30.0–36.0)
MCV: 90.8 fL (ref 80.0–100.0)
Platelets: 265 10*3/uL (ref 150–400)
RBC: 4.48 MIL/uL (ref 4.22–5.81)
RDW: 13.2 % (ref 11.5–15.5)
WBC: 8.8 10*3/uL (ref 4.0–10.5)
nRBC: 0 % (ref 0.0–0.2)

## 2023-08-09 LAB — TYPE AND SCREEN
ABO/RH(D): O POS
Antibody Screen: NEGATIVE

## 2023-08-09 MED ORDER — ATORVASTATIN CALCIUM 20 MG PO TABS
40.0000 mg | ORAL_TABLET | Freq: Every day | ORAL | Status: DC
  Administered 2023-08-09 – 2023-08-10 (×2): 40 mg via ORAL
  Filled 2023-08-09 (×2): qty 2

## 2023-08-09 MED ORDER — IPRATROPIUM-ALBUTEROL 0.5-2.5 (3) MG/3ML IN SOLN
3.0000 mL | Freq: Four times a day (QID) | RESPIRATORY_TRACT | Status: DC
  Administered 2023-08-09 (×3): 3 mL via RESPIRATORY_TRACT
  Filled 2023-08-09 (×3): qty 3

## 2023-08-09 MED ORDER — PANTOPRAZOLE SODIUM 40 MG PO TBEC
40.0000 mg | DELAYED_RELEASE_TABLET | Freq: Every day | ORAL | Status: DC
  Administered 2023-08-09 – 2023-08-10 (×2): 40 mg via ORAL
  Filled 2023-08-09 (×2): qty 1

## 2023-08-09 MED ORDER — POTASSIUM CHLORIDE CRYS ER 20 MEQ PO TBCR
40.0000 meq | EXTENDED_RELEASE_TABLET | Freq: Once | ORAL | Status: AC
  Administered 2023-08-09: 40 meq via ORAL
  Filled 2023-08-09: qty 2

## 2023-08-09 MED ORDER — METOPROLOL SUCCINATE ER 50 MG PO TB24
50.0000 mg | ORAL_TABLET | Freq: Every day | ORAL | Status: DC
  Administered 2023-08-09 – 2023-08-10 (×2): 50 mg via ORAL
  Filled 2023-08-09 (×2): qty 1

## 2023-08-09 MED ORDER — LISINOPRIL 20 MG PO TABS
20.0000 mg | ORAL_TABLET | Freq: Every day | ORAL | Status: DC
  Administered 2023-08-09 – 2023-08-10 (×2): 20 mg via ORAL
  Filled 2023-08-09: qty 2
  Filled 2023-08-09: qty 1

## 2023-08-09 MED ORDER — MOMETASONE FURO-FORMOTEROL FUM 200-5 MCG/ACT IN AERO
2.0000 | INHALATION_SPRAY | Freq: Two times a day (BID) | RESPIRATORY_TRACT | Status: DC
  Administered 2023-08-09 – 2023-08-10 (×3): 2 via RESPIRATORY_TRACT
  Filled 2023-08-09 (×3): qty 8.8

## 2023-08-09 MED ORDER — AMLODIPINE BESYLATE 10 MG PO TABS
10.0000 mg | ORAL_TABLET | Freq: Every day | ORAL | Status: DC
  Administered 2023-08-09 – 2023-08-10 (×2): 10 mg via ORAL
  Filled 2023-08-09: qty 1
  Filled 2023-08-09: qty 2

## 2023-08-09 MED ORDER — PREDNISONE 20 MG PO TABS
50.0000 mg | ORAL_TABLET | Freq: Every day | ORAL | Status: DC
  Administered 2023-08-09 – 2023-08-10 (×2): 50 mg via ORAL
  Filled 2023-08-09 (×2): qty 1

## 2023-08-09 MED ORDER — DABIGATRAN ETEXILATE MESYLATE 150 MG PO CAPS
150.0000 mg | ORAL_CAPSULE | Freq: Two times a day (BID) | ORAL | Status: DC
  Administered 2023-08-09 – 2023-08-10 (×3): 150 mg via ORAL
  Filled 2023-08-09 (×4): qty 1

## 2023-08-09 NOTE — TOC Initial Note (Signed)
 Transition of Care [facility]) - Initial/Assessment Note    Patient Details  Name: Charles Bryant MRN: [id number] Date of Birth: 02-05-1956  Transition of Care Charles Bryant [facility]) CM/SW Contact:    Elberta Fortis, RN Phone Number: 08/09/2023, 2:59 PM  Clinical Narrative:                 Met with patient in his hospital room. TOC consult was placed for possible SNF or home health care. When discussing with patient he laughed and said, "I don't need to go to a nursing facility, my wife is a Editor, commissioning and my daughter is an Charity fundraiser". He states he doesn't utilize any DME and is independent in his ADL's. He doesn't anticipate any home needs at this time. TOC to monitor for DC needs/planning  Expected Discharge Plan: Home/Self Care Barriers to Discharge: Continued Medical Work up   Patient Goals and CMS Choice Patient states their goals for this hospitalization and ongoing recovery are:: "To go back home with my wife"          Expected Discharge Plan and Services   Discharge Planning Services: CM Consult   Living arrangements for the past 2 months: Independent Living Facility                                      Prior Living Arrangements/Services Living arrangements for the past 2 months: Independent Living Facility Lives with:: Spouse Patient language and need for interpreter reviewed:: Yes Do you feel safe going back to the place where you live?: Yes      Need for Family Participation in Patient Care: Yes (Comment) Care giver support system in place?: Yes (comment) Current home services: DME ([employer]) Criminal Activity/Legal Involvement Pertinent to Current Situation/Hospitalization: No - Comment as needed  Activities of Daily Living   ADL Screening (condition at time of admission) Independently performs ADLs?: Yes (appropriate for developmental age) Is the patient deaf or have difficulty hearing?: No Does the patient have difficulty seeing, even when wearing glasses/contacts?: No Does  the patient have difficulty concentrating, remembering, or making decisions?: No  Permission Sought/Granted                  Emotional Assessment Appearance:: Appears younger than stated age Attitude/Demeanor/Rapport: Apprehensive, Guarded Affect (typically observed): Accepting, Agitated Orientation: : Oriented to Self, Oriented to Place, Oriented to  Time, Oriented to Situation Alcohol / Substance Use: Never Used Psych Involvement: No (comment)  Admission diagnosis:  Rash [R21] SOB (shortness of breath) [R06.02] Patient Active Problem List   Diagnosis Date Noted   Rash 08/08/2023   Atrial fibrillation, chronic (HCC) 08/08/2023   Obesity (BMI 30-39.9) 08/08/2023   SOB (shortness of breath) 08/08/2023   Hypercoagulable state due to persistent atrial fibrillation (HCC) 07/20/2023   A-fib (HCC) 06/01/2023   Advanced care planning/counseling discussion 07/02/2022   Tobacco abuse 01/15/2022   AAA (abdominal aortic aneurysm) without rupture (HCC) 12/30/2021   Carotid artery stenosis 12/30/2021   Hyperlipidemia    GERD (gastroesophageal reflux disease)    COPD (chronic obstructive pulmonary disease) (HCC)    Asthma    Essential hypertension    PCP:  Larae Grooms, NP Pharmacy:   [facility] [phone] [address] [address], [place] - [address] [place] [address] Phone: [phone] Fax: [phone]     Social Drivers of Health (SDOH) Social History: SDOH Screenings   Food Insecurity: No Food Insecurity (  08/09/2023)  Housing: Low Risk  (08/09/2023)  Transportation Needs: No Transportation Needs (08/09/2023)  Utilities: Not At Risk (08/09/2023)  Alcohol Screen: Medium Risk (05/31/2023)  Depression (PHQ2-9): Low Risk  (06/01/2023)  Financial Resource Strain: Low Risk  (05/31/2023)  Physical Activity: Unknown (05/31/2023)  Recent Concern: Physical Activity - Inactive (05/31/2023)  Social Connections: Moderately Integrated (08/09/2023)  Stress: No Stress Concern  Present (05/31/2023)  Tobacco Use: Medium Risk (08/09/2023)   SDOH Interventions:     Readmission Risk Interventions     No data to display

## 2023-08-09 NOTE — Progress Notes (Signed)
 PHARMACY - ANTICOAGULATION CONSULT NOTE  Pharmacy Consult for DABIGATRAN [facility])  Indication: atrial fibrillation  Allergies  Allergen Reactions   Eliquis [Apixaban] Hives    Patient Measurements:    Vital Signs: Temp: 98.2 F (36.8 C) (04/01 0549) Temp Source: Oral (04/01 0549) BP: 130/84 (04/01 0600) Pulse Rate: 93 (04/01 0600)  Labs: Recent Labs    08/08/23 1519 08/08/23 1821 08/09/23 0243  HGB 14.9  --  14.2  HCT 44.1  --  40.7  PLT 293  --  265  CREATININE 0.89  --  0.86  TROPONINIHS  --  5  --     CrCl cannot be calculated (Unknown ideal weight.).   Medical History: Past Medical History:  Diagnosis Date   Advanced care planning/counseling discussion 07/02/2022   Allergy Childhood   Asthma    COPD (chronic obstructive pulmonary disease) (HCC)    COPD (chronic obstructive pulmonary disease) (HCC)    GERD (gastroesophageal reflux disease)    Hyperlipidemia    Hypertension     Medications:  (Not in a hospital admission)  Scheduled:   amLODipine  10 mg Oral Daily   atorvastatin  40 mg Oral Daily   dabigatran  150 mg Oral Q12H   furosemide  40 mg Intravenous Q12H   ipratropium-albuterol  3 mL Nebulization Q6H   lisinopril  20 mg Oral Daily   metoprolol succinate  50 mg Oral Daily   mometasone-formoterol  2 puff Inhalation BID   pantoprazole  40 mg Oral Daily   potassium chloride  40 mEq Oral Once   predniSONE  50 mg Oral Q breakfast   Infusions:   Assessment: 68 yo M on Pradaxa PTA for AFIB. Now to be continued. Per patient  cardiologist switched Eliquis to Pradaxa for him 3 weeks ago due to rash possibly attributed to apixaban. Note recent office visit 07/20/23:  Wt 101 kg (222 lb 9.6 oz) Scr 0.86 Hgb 14.2  Plt 265  Goal of Therapy:  Monitor platelets by anticoagulation protocol: Yes   Plan:  Resume PTA dabigatran (Pradaxa) 150 mg po BID. F/u Scr/CBC per protocol  Bari Mantis PharmD Clinical Pharmacist 08/09/2023

## 2023-08-09 NOTE — ED Notes (Addendum)
 Pt given urinal, call bell within reach and instructed to call if assistance needed

## 2023-08-09 NOTE — Progress Notes (Signed)
 PROGRESS NOTE  Charles Bryant  ZOX:[id number] DOB: 08/31/1955 DOA: 08/08/2023 PCP: Larae Grooms, NP   Brief Narrative: Patient  is a 68 y.o. male with medical history significant of former smoker, COPD/asthma, HLD, GERD, A fib, AAA, carotid artery stenosis who presented to the emergency department with complaint of rash.. The rashes started on his abdomen, then progressed to have involved bilateral arm, bilateral leg and back.  Rash started about 5 days ago after starting on Eliquis .due to rash, his cardiologist switched  his anticoagulation from Eliquis to Pradaxa.  Also reported exertional shortness of breath.  On presentation he had mild elevated BNP, blood pressure normal, mild sinus tachycardia.  EDP discussed with dermatology at [facility] and they do not think that the patient needs to be transferred, is less likely Stevens-Johnson syndrome, started on prednisone.  Assessment & Plan:  Principal Problem:   Rash Active Problems:   SOB (shortness of breath)   Hyperlipidemia   COPD (chronic obstructive pulmonary disease) (HCC)   Asthma   Essential hypertension   Atrial fibrillation, chronic (HCC)   Obesity (BMI 30-39.9)   Rash: Etiology is not clear, started after  Eliquis use.  Due to concerning for possible Stevens-Johnson syndrome, we recommended to consult dermatology at tertiary facility. EDP dicussed with dermatology at [facility], dermatology did not recommend transfer and continue monitoring with steroids -Continue as needed Benadryl -Start prednisone empirically, 50 mg daily -Patient may need to follow-up with dermatologist as outpatient. -As per patient, rash is less itchy now.  Rash generalized.  May need skin biopsy if the rash does not improve within next few days   SOB : Etiology is not clear.  No wheezing, does not seem to have COPD exacerbation.  Chest x-ray negative.  BNP slightly elevated at 185.  Patient had 2D echo on 06/27/2023 which showed EF 60 to 65%, diastolic  function was not determined.  Patient may have mild fluid overload due to possible diastolic CHF.  No oxygen desaturation. -Given a dose of Lasix.  Currently euvolemic -Bronchodilators as needed Mucinex -Continue volume status daily   Hyperlipidemia -Lipitor   History of COPD  and Asthma -Continue bronchodilators as needed   Essential hypertension -Monitor blood pressure -Continue home amlodipine, lisinopril, metoprolol   Chronic atrial fibrillation: Heart rate 100s --> 90s -On metoprolol for rate control -On Pradaxa for anticoagulation.  Initially on Eliquis but patient stated the rash started after starting on Eliquis   Obesity: Body weight 101 kg, BMI 32.23 -Encourage losing weight         DVT prophylaxis:Pradaxa     Code Status: Full Code  Family Communication: None at bedside  Patient status:Obs  Patient is from :Home  Anticipated discharge WJ:XBJY  Estimated DC date:after improvement in the rash   Consultants: None  Procedures:None  Antimicrobials:  Anti-infectives (From admission, onward)    None       Subjective: Patient seen and examined at bedside today.  Comfortable lying on bed.  EKG monitor with A-fib rhythm.  He says that rash is less itchy.  Rash is generalized.  No fever  Objective: Vitals:   08/09/23 0008 08/09/23 0330 08/09/23 0549 08/09/23 0600  BP:  98/69  130/84  Pulse: 87 87  93  Resp: (!) 21 17  19   Temp:   98.2 F (36.8 C)   TempSrc:   Oral   SpO2: 95% 94%  96%   No intake or output data in the 24 hours ending 08/09/23 0823 There  were no vitals filed for this visit.  Examination:  General exam: Overall comfortable, not in distress,obese HEENT: PERRL Respiratory system:  no wheezes or crackles  Cardiovascular system: Irregularly irregular rhythm.  Gastrointestinal system: Abdomen is nondistended, soft and nontender. Central nervous system: Alert and oriented Extremities: No edema, no clubbing ,no cyanosis Skin:  Generalized maculopapular rash   Data Reviewed: I have personally reviewed following labs and imaging studies  CBC: Recent Labs  Lab 08/08/23 1519 08/09/23 0243  WBC 9.1 8.8  NEUTROABS 5.6  --   HGB 14.9 14.2  HCT 44.1 40.7  MCV 93.4 90.8  PLT 293 265   Basic Metabolic Panel: Recent Labs  Lab 08/08/23 1519 08/09/23 0243  NA 133* 134*  K 4.1 3.4*  CL 96* 96*  CO2 27 26  GLUCOSE 108* 103*  BUN 16 16  CREATININE 0.89 0.86  CALCIUM 8.9 8.9  MG  --  2.2     No results found for this or any previous visit (from the past 240 hours).   Radiology Studies: DG Chest 2 View Result Date: 08/08/2023 CLINICAL DATA:  Shortness of breath EXAM: CHEST - 2 VIEW COMPARISON:  06/13/2022 FINDINGS: Atherosclerotic calcification of the aortic arch. The lungs appear clear. No blunting of the costophrenic angles. Heart size within normal limits. Midthoracic spine density seen on the lateral view is attributable to costovertebral spurring based on comparison to the 02/19/2022 cardiac CT. IMPRESSION: 1. No acute findings. 2. Aortic Atherosclerosis (ICD10-I70.0). Electronically Signed   By: Gaylyn Rong M.D.   On: 08/08/2023 18:26    Scheduled Meds:  amLODipine  10 mg Oral Daily   atorvastatin  40 mg Oral Daily   enoxaparin (LOVENOX) injection  50 mg Subcutaneous Q24H   furosemide  40 mg Intravenous Q12H   ipratropium-albuterol  3 mL Nebulization Q6H   lisinopril  20 mg Oral Daily   metoprolol succinate  50 mg Oral Daily   mometasone-formoterol  2 puff Inhalation BID   pantoprazole  40 mg Oral Daily   potassium chloride  40 mEq Oral Once   predniSONE  50 mg Oral Q breakfast   Continuous Infusions:   LOS: 0 days   Burnadette Pop, MD [facility] P4/05/2023, 8:23 AM

## 2023-08-09 NOTE — ED Notes (Signed)
 This RN gave report to [employer] and performed care handoff via phone. Pt updated on care plan.

## 2023-08-09 NOTE — Progress Notes (Signed)
 Consult to HF Navigation Team Placed. Unfortunately, this patient does not meet criteria given no current CHF diagnosis noted. Please feel free to reach out with any questions or medication assistance needs.  Thank you for involving the HF Navigation Team in this patient's care.  Enos Fling, PharmD, BCPS Clinical Pharmacist 12/16/2022 12:50 PM

## 2023-08-10 DIAGNOSIS — I482 Chronic atrial fibrillation, unspecified: Secondary | ICD-10-CM | POA: Diagnosis not present

## 2023-08-10 DIAGNOSIS — I10 Essential (primary) hypertension: Secondary | ICD-10-CM | POA: Diagnosis not present

## 2023-08-10 DIAGNOSIS — R21 Rash and other nonspecific skin eruption: Secondary | ICD-10-CM | POA: Diagnosis not present

## 2023-08-10 DIAGNOSIS — E669 Obesity, unspecified: Secondary | ICD-10-CM

## 2023-08-10 DIAGNOSIS — Z889 Allergy status to unspecified drugs, medicaments and biological substances status: Secondary | ICD-10-CM

## 2023-08-10 DIAGNOSIS — I5032 Chronic diastolic (congestive) heart failure: Secondary | ICD-10-CM

## 2023-08-10 LAB — CBC
HCT: 39.5 % (ref 39.0–52.0)
Hemoglobin: 13.8 g/dL (ref 13.0–17.0)
MCH: 31.7 pg (ref 26.0–34.0)
MCHC: 34.9 g/dL (ref 30.0–36.0)
MCV: 90.6 fL (ref 80.0–100.0)
Platelets: 250 10*3/uL (ref 150–400)
RBC: 4.36 MIL/uL (ref 4.22–5.81)
RDW: 13.2 % (ref 11.5–15.5)
WBC: 7.8 10*3/uL (ref 4.0–10.5)
nRBC: 0 % (ref 0.0–0.2)

## 2023-08-10 LAB — BASIC METABOLIC PANEL WITH GFR
Anion gap: 8 (ref 5–15)
BUN: 18 mg/dL (ref 8–23)
CO2: 27 mmol/L (ref 22–32)
Calcium: 8.5 mg/dL — ABNORMAL LOW (ref 8.9–10.3)
Chloride: 95 mmol/L — ABNORMAL LOW (ref 98–111)
Creatinine, Ser: 0.7 mg/dL (ref 0.61–1.24)
GFR, Estimated: >60 mL/min (ref >60)
Glucose, Bld: 119 mg/dL — ABNORMAL HIGH (ref 70–99)
Potassium: 3.2 mmol/L — ABNORMAL LOW (ref 3.5–5.1)
Sodium: 130 mmol/L — ABNORMAL LOW (ref 135–145)

## 2023-08-10 MED ORDER — POTASSIUM CHLORIDE CRYS ER 20 MEQ PO TBCR: 20.0000 meq | EXTENDED_RELEASE_TABLET | Freq: Every day | ORAL | 0 refills | Status: DC

## 2023-08-10 MED ORDER — DIPHENHYDRAMINE HCL 25 MG PO CAPS: 25.0000 mg | ORAL_CAPSULE | Freq: Three times a day (TID) | ORAL | 0 refills | Status: DC | PRN

## 2023-08-10 MED ORDER — POTASSIUM CHLORIDE CRYS ER 20 MEQ PO TBCR: 40.0000 meq | EXTENDED_RELEASE_TABLET | Freq: Every day | ORAL | Status: DC

## 2023-08-10 MED ORDER — IPRATROPIUM-ALBUTEROL 0.5-2.5 (3) MG/3ML IN SOLN
3.0000 mL | Freq: Two times a day (BID) | RESPIRATORY_TRACT | Status: DC
Start: 2023-08-10 — End: 2023-08-10
  Administered 2023-08-10: 3 mL via RESPIRATORY_TRACT
  Filled 2023-08-10: qty 3

## 2023-08-10 MED ORDER — PREDNISONE 20 MG PO TABS: ORAL_TABLET | ORAL | 0 refills | Status: AC

## 2023-08-10 NOTE — Care Management Obs Status (Signed)
 MEDICARE OBSERVATION STATUS NOTIFICATION   Patient Details  Name: Charles Bryant MRN: [id number] Date of Birth: [birth year]   Medicare Observation Status Notification Given:  Orland Dec, CMA 08/10/2023, 9:44 AM

## 2023-08-10 NOTE — TOC Transition Note (Signed)
 Transition of Care [facility]) - Discharge Note   Patient Details  Name: Charles Bryant MRN: [id number] Date of Birth: [birth year]  Transition of Care [facility]) CM/SW Contact:  Chapman Fitch, RN Phone Number: 08/10/2023, 10:20 AM   Clinical Narrative:       Transition of Care (TOC) Screening Note   Patient Details  Name: Charles Bryant Date of Birth: [birth year]   Transition of Care [facility]) CM/SW Contact:    Chapman Fitch, RN Phone Number: 08/10/2023, 10:21 AM    Transition of Care Department [facility]) has reviewed patient and no TOC needs have been identified at this time.  If new patient transition needs arise, please place a TOC consult.       Barriers to Discharge: Continued Medical Work up   Patient Goals and CMS Choice Patient states their goals for this hospitalization and ongoing recovery are:: "To go back home with my wife"          Discharge Placement                       Discharge Plan and Services Additional resources added to the After Visit Summary for     Discharge Planning Services: CM Consult                                 Social Drivers of Health (SDOH) Interventions SDOH Screenings   Food Insecurity: No Food Insecurity (08/09/2023)  Housing: Low Risk  (08/09/2023)  Transportation Needs: No Transportation Needs (08/09/2023)  Utilities: Not At Risk (08/09/2023)  Alcohol Screen: Medium Risk (05/31/2023)  Depression (PHQ2-9): Low Risk  (06/01/2023)  Financial Resource Strain: Low Risk  (05/31/2023)  Physical Activity: Unknown (05/31/2023)  Recent Concern: Physical Activity - Inactive (05/31/2023)  Social Connections: Moderately Integrated (08/09/2023)  Stress: No Stress Concern Present (05/31/2023)  Tobacco Use: Medium Risk (08/09/2023)     Readmission Risk Interventions     No data to display

## 2023-08-10 NOTE — Discharge Summary (Signed)
 Physician Discharge Summary   Patient: Charles Bryant MRN: [id number] DOB: 1955/07/29  Admit date:     08/08/2023  Discharge date: {dischdate:26783}  Discharge Physician: Marcelino Duster   PCP: Larae Grooms, NP   Recommendations at discharge:  {Tip this will not be part of the note when signed- Example include specific recommendations for outpatient follow-up, pending tests to follow-up on. (Optional):26781}  ***  Discharge Diagnoses: Principal Problem:   Rash Active Problems:   SOB (shortness of breath)   Hyperlipidemia   COPD (chronic obstructive pulmonary disease) (HCC)   Asthma   Essential hypertension   Atrial fibrillation, chronic (HCC)   Obesity (BMI 30-39.9)  Resolved Problems:   * No resolved hospital problems. [facility] Course: No notes on file  Assessment and Plan: No notes have been filed under this hospital service. Service: Hospitalist     {Tip this will not be part of the note when signed Body mass index is 32.65 kg/m. , ,  (Optional):26781}  {(NOTE) Pain control PDMP Statment (Optional):26782} Consultants: *** Procedures performed: ***  Disposition: {Plan; Disposition:26390} Diet recommendation:  Discharge Diet Orders (From admission, onward)     Start     Ordered   08/10/23 0000  Diet - low sodium heart healthy        08/10/23 1008           {Diet_Plan:26776} DISCHARGE MEDICATION: Allergies as of 08/10/2023       Reactions   Eliquis [apixaban] Hives        Medication List     STOP taking these medications    quinapril 10 MG tablet Commonly known as: ACCUPRIL       TAKE these medications    amLODipine 10 MG tablet Commonly known as: NORVASC Take 1 tablet (10 mg total) by mouth daily.   atorvastatin 40 MG tablet Commonly known as: LIPITOR TAKE 1 TABLET BY MOUTH DAILY   dabigatran 150 MG Caps capsule Commonly known as: PRADAXA TAKE 1 CAPSULE BY MOUTH TWICE A DAY   diphenhydrAMINE 25 mg capsule Commonly  known as: BENADRYL Take 1 capsule (25 mg total) by mouth every 8 (eight) hours as needed for itching or allergies.   fluticasone-salmeterol 500-50 MCG/ACT Aepb Commonly known as: Wixela Inhub USE 1 INHALATION BY MOUTH  TWICE DAILY What changed:  how much to take how to take this when to take this   furosemide 20 MG tablet Commonly known as: Lasix Take 1 tablet (20 mg total) by mouth daily as needed (swelling/weight gain).   hydrochlorothiazide 12.5 MG tablet Commonly known as: HYDRODIURIL Take 12.5 mg by mouth daily.   ipratropium-albuterol 0.5-2.5 (3) MG/3ML Soln Commonly known as: DUONEB Take 3 mLs by nebulization every 6 (six) hours as needed.   lisinopril 20 MG tablet Commonly known as: ZESTRIL Take 1 tablet (20 mg total) by mouth daily.   metoprolol succinate 50 MG 24 hr tablet Commonly known as: TOPROL-XL TAKE 1 TABLET BY MOUTH DAILY  WITH OR IMMEDIATELY FOLLOWING A  MEAL What changed:  how much to take how to take this when to take this   omeprazole 40 MG capsule Commonly known as: PRILOSEC Take 1 capsule (40 mg total) by mouth daily.   potassium chloride SA 20 MEQ tablet Commonly known as: KLOR-CON M Take 1 tablet (20 mEq total) by mouth daily.   predniSONE 20 MG tablet Commonly known as: DELTASONE Take 2 tablets (40 mg total) by mouth daily with breakfast for 2 days, THEN 1 tablet (20  mg total) daily with breakfast for 2 days. Start taking on: August 11, 2023   tadalafil 20 MG tablet Commonly known as: CIALIS Take 1 tablet (20 mg total) by mouth daily as needed for erectile dysfunction.   Ventolin HFA 108 (90 Base) MCG/ACT inhaler Generic drug: albuterol USE 2 INHALATIONS BY MOUTH EVERY 6 HOURS AS NEEDED FOR WHEEZING  OR SHORTNESS OF BREATH What changed: See the new instructions.        Discharge Exam: [name] Weights   08/09/23 2009 08/10/23 0421  Weight: 103.6 kg 103.2 kg   ***  Condition at discharge: {DC Condition:26389}  The results of  significant diagnostics from this hospitalization (including imaging, microbiology, ancillary and laboratory) are listed below for reference.   Imaging Studies: DG Chest 2 View Result Date: 08/08/2023 CLINICAL DATA:  Shortness of breath EXAM: CHEST - 2 VIEW COMPARISON:  06/13/2022 FINDINGS: Atherosclerotic calcification of the aortic arch. The lungs appear clear. No blunting of the costophrenic angles. Heart size within normal limits. Midthoracic spine density seen on the lateral view is attributable to costovertebral spurring based on comparison to the 02/19/2022 cardiac CT. IMPRESSION: 1. No acute findings. 2. Aortic Atherosclerosis (ICD10-I70.0). Electronically Signed   By: Gaylyn Rong M.D.   On: 08/08/2023 18:26   EP STUDY Result Date: 07/13/2023 See surgical note for result.   Microbiology: Results for orders placed or performed during the hospital encounter of 04/13/23  Resp panel by RT-PCR (RSV, Flu A&B, Covid) Anterior Nasal Swab     Status: None   Collection Time: 04/13/23  1:02 AM   Specimen: Anterior Nasal Swab  Result Value Ref Range Status   SARS Coronavirus 2 by RT PCR NEGATIVE NEGATIVE Final    Comment: (NOTE) SARS-CoV-2 target nucleic acids are NOT DETECTED.  The SARS-CoV-2 RNA is generally detectable in upper respiratory specimens during the acute phase of infection. The lowest concentration of SARS-CoV-2 viral copies this assay can detect is 138 copies/mL. A negative result does not preclude SARS-Cov-2 infection and should not be used as the sole basis for treatment or other patient management decisions. A negative result may occur with  improper specimen collection/handling, submission of specimen other than nasopharyngeal swab, presence of viral mutation(s) within the areas targeted by this assay, and inadequate number of viral copies(<138 copies/mL). A negative result must be combined with clinical observations, patient history, and  epidemiological information. The expected result is Negative.  Fact Sheet for Patients:  BloggerCourse.com  Fact Sheet for Healthcare Providers:  SeriousBroker.it  This test is no t yet approved or cleared by the [place] FDA and  has been authorized for detection and/or diagnosis of SARS-CoV-2 by FDA under an Emergency Use Authorization (EUA). This EUA will remain  in effect (meaning this test can be used) for the duration of the COVID-19 declaration under Section 564(b)(1) of the Act, 21 U.S.C.section 360bbb-3(b)(1), unless the authorization is terminated  or revoked sooner.       Influenza A by PCR NEGATIVE NEGATIVE Final   Influenza B by PCR NEGATIVE NEGATIVE Final    Comment: (NOTE) The Xpert Xpress SARS-CoV-2/FLU/RSV plus assay is intended as an aid in the diagnosis of influenza from Nasopharyngeal swab specimens and should not be used as a sole basis for treatment. Nasal washings and aspirates are unacceptable for Xpert Xpress SARS-CoV-2/FLU/RSV testing.  Fact Sheet for Patients: BloggerCourse.com  Fact Sheet for Healthcare Providers: SeriousBroker.it  This test is not yet approved or cleared by the [place] FDA and has  been authorized for detection and/or diagnosis of SARS-CoV-2 by FDA under an Emergency Use Authorization (EUA). This EUA will remain in effect (meaning this test can be used) for the duration of the COVID-19 declaration under Section 564(b)(1) of the Act, 21 U.S.C. section 360bbb-3(b)(1), unless the authorization is terminated or revoked.     Resp Syncytial Virus by PCR NEGATIVE NEGATIVE Final    Comment: (NOTE) Fact Sheet for Patients: BloggerCourse.com  Fact Sheet for Healthcare Providers: SeriousBroker.it  This test is not yet approved or cleared by the [place] FDA and has been  authorized for detection and/or diagnosis of SARS-CoV-2 by FDA under an Emergency Use Authorization (EUA). This EUA will remain in effect (meaning this test can be used) for the duration of the COVID-19 declaration under Section 564(b)(1) of the Act, 21 U.S.C. section 360bbb-3(b)(1), unless the authorization is terminated or revoked.  Performed at [facility], [address] [place], [place] [address]     Labs: CBC: Recent Labs  Lab 08/08/23 1519 08/09/23 0243 08/10/23 0519  WBC 9.1 8.8 7.8  NEUTROABS 5.6  --   --   HGB 14.9 14.2 13.8  HCT 44.1 40.7 39.5  MCV 93.4 90.8 90.6  PLT 293 265 250   Basic Metabolic Panel: Recent Labs  Lab 08/08/23 1519 08/09/23 0243 08/10/23 0519  NA 133* 134* 130*  K 4.1 3.4* 3.2*  CL 96* 96* 95*  CO2 27 26 27   GLUCOSE 108* 103* 119*  BUN 16 16 18   CREATININE 0.89 0.86 0.70  CALCIUM 8.9 8.9 8.5*  MG  --  2.2  --    Liver Function Tests: No results for input(s): "AST", "ALT", "ALKPHOS", "BILITOT", "PROT", "ALBUMIN" in the last 168 hours. CBG: No results for input(s): "GLUCAP" in the last 168 hours.  Discharge time spent: {LESS THAN/GREATER OZHY:86578} 30 minutes.  Signed: Marcelino Duster, MD [facility] 08/10/2023

## 2023-08-11 ENCOUNTER — Other Ambulatory Visit (HOSPITAL_COMMUNITY): Payer: Self-pay | Admitting: Physician Assistant

## 2023-08-16 ENCOUNTER — Encounter: Payer: Self-pay | Admitting: Nurse Practitioner

## 2023-08-16 ENCOUNTER — Ambulatory Visit (INDEPENDENT_AMBULATORY_CARE_PROVIDER_SITE_OTHER): Admitting: Nurse Practitioner

## 2023-08-16 VITALS — BP 106/65 | HR 81 | Temp 98.7°F | Resp 17 | Ht 70.0 in | Wt 214.2 lb

## 2023-08-16 DIAGNOSIS — R21 Rash and other nonspecific skin eruption: Secondary | ICD-10-CM

## 2023-08-16 DIAGNOSIS — I482 Chronic atrial fibrillation, unspecified: Secondary | ICD-10-CM | POA: Diagnosis not present

## 2023-08-16 DIAGNOSIS — Z09 Encounter for follow-up examination after completed treatment for conditions other than malignant neoplasm: Secondary | ICD-10-CM

## 2023-08-16 NOTE — Assessment & Plan Note (Signed)
 Secondary to allergic reaction from Eliquis.  Symptoms are improving.

## 2023-08-16 NOTE — Assessment & Plan Note (Signed)
 Chronic.  Likely going for Ablation. Had a severe allergic reaction to Eliquis.  Now on Pradaxa and tolerating it well.  Continue to follow up with Cardiology and EP.

## 2023-08-16 NOTE — Progress Notes (Signed)
 BP 106/65 (BP Location: Right Arm, Patient Position: Sitting, Cuff Size: Large)   Pulse 81   Temp 98.7 F (37.1 C) (Oral)   Resp 17   Ht 5\' 10"  (1.778 m)   Wt 214 lb 3.2 oz (97.2 kg)   SpO2 97%   BMI 30.73 kg/m    Subjective:    Patient ID: Charles Bryant, male    DOB: [birth year], 68 y.o.   MRN: [id number]  HPI: Charles Bryant is a 68 y.o. male  Chief Complaint  Patient presents with   Hospitalization Follow-up    Allergic reaction to Eliquis. Getting better every day, still some itching.    Transition of Care Hospital Follow up.   Hospital/Facility: [facility] D/C Physician: Dr. Clide Dales D/C Date: 08/08/23  Records Requested: NA Records Received:  Records Reviewed:   Diagnoses on Discharge:   Possible CHF: Currently euvolemic Continue home dose Lasix as needed. Advised to follow up with PCP.- no longer taking lasix.  He is taking HCTZ.  Has an appointment Cardiology.   Hyponatremia-  Seems chronic. Advised to hold hydrochlorothiazide and discuss with PCP.   Hyperlipidemia On Lipitor   History of COPD  and Asthma Continue bronchodilators as needed   Essential hypertension BP stable. Continue home amlodipine, lisinopril, metoprolol   Denies HA, CP, SOB, dizziness, palpitations, visual changes, and lower extremity swelling.    Chronic atrial fibrillation: Heart rate 100s --> 90s On metoprolol for rate control On Pradaxa for anticoagulation.  Initially on Eliquis but stopped due to the rash.   Obesity class 1: BMI 32.23 Encourage losing weight    Date of interactive Contact within 48 hours of discharge:  Contact was through:  no contact was made  Date of 7 day or 14 day face-to-face visit:    within 7 days  Outpatient Encounter Medications as of 08/16/2023  Medication Sig Note   amLODipine (NORVASC) 10 MG tablet Take 1 tablet (10 mg total) by mouth daily.    atorvastatin (LIPITOR) 40 MG tablet TAKE 1 TABLET BY MOUTH DAILY    dabigatran (PRADAXA) 150 MG CAPS  capsule TAKE 1 CAPSULE BY MOUTH TWICE A DAY    diphenhydrAMINE (BENADRYL) 25 mg capsule Take 1 capsule (25 mg total) by mouth every 8 (eight) hours as needed for itching or allergies.    fluticasone-salmeterol (WIXELA INHUB) 500-50 MCG/ACT AEPB USE 1 INHALATION BY MOUTH  TWICE DAILY (Patient taking differently: Inhale 1 puff into the lungs in the morning and at bedtime. USE 1 INHALATION BY MOUTH  TWICE DAILY)    hydrochlorothiazide (HYDRODIURIL) 12.5 MG tablet Take 12.5 mg by mouth daily.    ipratropium-albuterol (DUONEB) 0.5-2.5 (3) MG/3ML SOLN Take 3 mLs by nebulization every 6 (six) hours as needed. 08/09/2023: prn   lisinopril (ZESTRIL) 20 MG tablet Take 1 tablet (20 mg total) by mouth daily.    metoprolol succinate (TOPROL-XL) 50 MG 24 hr tablet TAKE 1 TABLET BY MOUTH DAILY  WITH OR IMMEDIATELY FOLLOWING A  MEAL (Patient taking differently: Take 50 mg by mouth daily. TAKE 1 TABLET BY MOUTH DAILY  WITH OR IMMEDIATELY FOLLOWING A  MEAL)    omeprazole (PRILOSEC) 40 MG capsule Take 1 capsule (40 mg total) by mouth daily.    potassium chloride SA (KLOR-CON M) 20 MEQ tablet Take 1 tablet (20 mEq total) by mouth daily.    tadalafil (CIALIS) 20 MG tablet Take 1 tablet (20 mg total) by mouth daily as needed for erectile dysfunction. 08/09/2023: prn  VENTOLIN HFA 108 (90 Base) MCG/ACT inhaler USE 2 INHALATIONS BY MOUTH EVERY 6 HOURS AS NEEDED FOR WHEEZING  OR SHORTNESS OF BREATH (Patient taking differently: Inhale 2 puffs into the lungs every 6 (six) hours as needed for wheezing or shortness of breath.) 08/09/2023: prn   [DISCONTINUED] furosemide (LASIX) 20 MG tablet TAKE 1 TABLET (20 MG TOTAL) BY MOUTH DAILY AS NEEDED (SWELLING/WEIGHT GAIN).    No facility-administered encounter medications on file as of 08/16/2023.    Diagnostic Tests Reviewed/Disposition: Reviewed  Consults: Seeing EP and Cardiology  Discharge Instructions: Reviewed  Disease/illness Education: Provided during visit  Home  Health/Community Services Discussions/Referrals: NA  Establishment or re-establishment of referral orders for community resources: Discussed during the visit  Discussion with other health care providers: NA  Assessment and Support of treatment regimen adherence: Discussed during visit  Appointments Coordinated with: Patient  Education for self-management, independent living, and ADLs: Independent  Relevant past medical, surgical, family and social history reviewed and updated as indicated. Interim medical history since our last visit reviewed. Allergies and medications reviewed and updated.  Review of Systems  Eyes:  Negative for visual disturbance.  Respiratory:  Negative for shortness of breath.   Cardiovascular:  Negative for chest pain and leg swelling.  Skin:  Positive for rash.  Neurological:  Negative for light-headedness and headaches.    Per HPI unless specifically indicated above     Objective:    BP 106/65 (BP Location: Right Arm, Patient Position: Sitting, Cuff Size: Large)   Pulse 81   Temp 98.7 F (37.1 C) (Oral)   Resp 17   Ht 5\' 10"  (1.778 m)   Wt 214 lb 3.2 oz (97.2 kg)   SpO2 97%   BMI 30.73 kg/m   Wt Readings from Last 3 Encounters:  08/16/23 214 lb 3.2 oz (97.2 kg)  08/10/23 227 lb 8.2 oz (103.2 kg)  07/20/23 222 lb 9.6 oz (101 kg)    Physical Exam Vitals and nursing note reviewed.  Constitutional:      General: He is not in acute distress.    Appearance: Normal appearance. He is not ill-appearing, toxic-appearing or diaphoretic.  HENT:     Head: Normocephalic.     Right Ear: External ear normal.     Left Ear: External ear normal.     Nose: Nose normal. No congestion or rhinorrhea.     Mouth/Throat:     Mouth: Mucous membranes are moist.  Eyes:     General:        Right eye: No discharge.        Left eye: No discharge.     Extraocular Movements: Extraocular movements intact.     Conjunctiva/sclera: Conjunctivae normal.     Pupils:  Pupils are equal, round, and reactive to light.  Cardiovascular:     Rate and Rhythm: Normal rate and regular rhythm.     Heart sounds: No murmur heard. Pulmonary:     Effort: Pulmonary effort is normal. No respiratory distress.     Breath sounds: Normal breath sounds. No wheezing, rhonchi or rales.  Abdominal:     General: Abdomen is flat. Bowel sounds are normal.  Musculoskeletal:     Cervical back: Normal range of motion and neck supple.  Skin:    General: Skin is warm and dry.     Capillary Refill: Capillary refill takes less than 2 seconds.  Neurological:     General: No focal deficit present.     Mental Status: He is  alert and oriented to person, place, and time.  Psychiatric:        Mood and Affect: Mood normal.        Behavior: Behavior normal.        Thought Content: Thought content normal.        Judgment: Judgment normal.     Results for orders placed or performed during the hospital encounter of 08/08/23  CBC with Differential   Collection Time: 08/08/23  3:19 PM  Result Value Ref Range   WBC 9.1 4.0 - 10.5 K/uL   RBC 4.72 4.22 - 5.81 MIL/uL   Hemoglobin 14.9 13.0 - 17.0 g/dL   HCT 16.1 09.6 - 04.5 %   MCV 93.4 80.0 - 100.0 fL   MCH 31.6 26.0 - 34.0 pg   MCHC 33.8 30.0 - 36.0 g/dL   RDW 40.9 81.1 - 91.4 %   Platelets 293 150 - 400 K/uL   nRBC 0.0 0.0 - 0.2 %   Neutrophils Relative % 62 %   Neutro Abs 5.6 1.7 - 7.7 K/uL   Lymphocytes Relative 17 %   Lymphs Abs 1.6 0.7 - 4.0 K/uL   Monocytes Relative 9 %   Monocytes Absolute 0.8 0.1 - 1.0 K/uL   Eosinophils Relative 11 %   Eosinophils Absolute 1.0 (H) 0.0 - 0.5 K/uL   Basophils Relative 1 %   Basophils Absolute 0.1 0.0 - 0.1 K/uL   Immature Granulocytes 0 %   Abs Immature Granulocytes 0.04 0.00 - 0.07 K/uL  Basic metabolic panel   Collection Time: 08/08/23  3:19 PM  Result Value Ref Range   Sodium 133 (L) 135 - 145 mmol/L   Potassium 4.1 3.5 - 5.1 mmol/L   Chloride 96 (L) 98 - 111 mmol/L   CO2 27 22  - 32 mmol/L   Glucose, Bld 108 (H) 70 - 99 mg/dL   BUN 16 8 - 23 mg/dL   Creatinine, Ser 7.82 0.61 - 1.24 mg/dL   Calcium 8.9 8.9 - 95.6 mg/dL   GFR, Estimated >21 >30 mL/min   Anion gap 10 5 - 15  Brain natriuretic peptide   Collection Time: 08/08/23  3:19 PM  Result Value Ref Range   B Natriuretic Peptide 185.3 (H) 0.0 - 100.0 pg/mL  Lactic acid, plasma   Collection Time: 08/08/23  6:21 PM  Result Value Ref Range   Lactic Acid, Venous 1.9 0.5 - 1.9 mmol/L  Sedimentation rate   Collection Time: 08/08/23  6:21 PM  Result Value Ref Range   Sed Rate 5 0 - 20 mm/hr  C-reactive protein   Collection Time: 08/08/23  6:21 PM  Result Value Ref Range   CRP 1.8 (H) <1.0 mg/dL  Troponin I (High Sensitivity)   Collection Time: 08/08/23  6:21 PM  Result Value Ref Range   Troponin I (High Sensitivity) 5 <18 ng/L  HIV Antibody (routine testing w rflx)   Collection Time: 08/09/23  1:24 AM  Result Value Ref Range   HIV Screen 4th Generation wRfx Non Reactive Non Reactive  Type and screen   Collection Time: 08/09/23  2:30 AM  Result Value Ref Range   ABO/RH(D) O POS    Antibody Screen NEG    Sample Expiration      08/12/2023,2359 Performed at [facility], [address] [place], [place] [address]   Basic metabolic panel   Collection Time: 08/09/23  2:43 AM  Result Value Ref Range   Sodium 134 (L) 135 - 145  mmol/L   Potassium 3.4 (L) 3.5 - 5.1 mmol/L   Chloride 96 (L) 98 - 111 mmol/L   CO2 26 22 - 32 mmol/L   Glucose, Bld 103 (H) 70 - 99 mg/dL   BUN 16 8 - 23 mg/dL   Creatinine, Ser 1.61 0.61 - 1.24 mg/dL   Calcium 8.9 8.9 - 09.6 mg/dL   GFR, Estimated >04 >54 mL/min   Anion gap 12 5 - 15  CBC   Collection Time: 08/09/23  2:43 AM  Result Value Ref Range   WBC 8.8 4.0 - 10.5 K/uL   RBC 4.48 4.22 - 5.81 MIL/uL   Hemoglobin 14.2 13.0 - 17.0 g/dL   HCT 09.8 11.9 - 14.7 %   MCV 90.8 80.0 - 100.0 fL   MCH 31.7 26.0 - 34.0 pg   MCHC 34.9 30.0 - 36.0 g/dL   RDW 82.9  56.2 - 13.0 %   Platelets 265 150 - 400 K/uL   nRBC 0.0 0.0 - 0.2 %  Magnesium   Collection Time: 08/09/23  2:43 AM  Result Value Ref Range   Magnesium 2.2 1.7 - 2.4 mg/dL  Basic metabolic panel with GFR   Collection Time: 08/10/23  5:19 AM  Result Value Ref Range   Sodium 130 (L) 135 - 145 mmol/L   Potassium 3.2 (L) 3.5 - 5.1 mmol/L   Chloride 95 (L) 98 - 111 mmol/L   CO2 27 22 - 32 mmol/L   Glucose, Bld 119 (H) 70 - 99 mg/dL   BUN 18 8 - 23 mg/dL   Creatinine, Ser 8.65 0.61 - 1.24 mg/dL   Calcium 8.5 (L) 8.9 - 10.3 mg/dL   GFR, Estimated >78 >46 mL/min   Anion gap 8 5 - 15  CBC   Collection Time: 08/10/23  5:19 AM  Result Value Ref Range   WBC 7.8 4.0 - 10.5 K/uL   RBC 4.36 4.22 - 5.81 MIL/uL   Hemoglobin 13.8 13.0 - 17.0 g/dL   HCT 96.2 95.2 - 84.1 %   MCV 90.6 80.0 - 100.0 fL   MCH 31.7 26.0 - 34.0 pg   MCHC 34.9 30.0 - 36.0 g/dL   RDW 32.4 40.1 - 02.7 %   Platelets 250 150 - 400 K/uL   nRBC 0.0 0.0 - 0.2 %      Assessment & Plan:   Problem List Items Addressed This Visit       Cardiovascular and Mediastinum   A-fib (HCC)   Chronic.  Likely going for Ablation. Had a severe allergic reaction to Eliquis.  Now on Pradaxa and tolerating it well.  Continue to follow up with Cardiology and EP.        Musculoskeletal and Integument   Rash   Secondary to allergic reaction from Eliquis.  Symptoms are improving.        Other Visit Diagnoses       Hospital discharge follow-up    -  Primary   Relevant Orders   Comp Met (CMET)   CBC w/Diff        Follow up plan: Return in 1 month (on 09/15/2023) for Annual wellness with PCP.

## 2023-08-17 ENCOUNTER — Encounter: Payer: Self-pay | Admitting: Nurse Practitioner

## 2023-08-17 LAB — CBC WITH DIFFERENTIAL/PLATELET
Basophils Absolute: 0.1 10*3/uL (ref 0.0–0.2)
Basos: 1 %
EOS (ABSOLUTE): 0.6 10*3/uL — ABNORMAL HIGH (ref 0.0–0.4)
Eos: 7 %
Hematocrit: 41.3 % (ref 37.5–51.0)
Hemoglobin: 13.9 g/dL (ref 13.0–17.7)
Immature Grans (Abs): 0.1 10*3/uL (ref 0.0–0.1)
Immature Granulocytes: 1 %
Lymphocytes Absolute: 2.6 10*3/uL (ref 0.7–3.1)
Lymphs: 29 %
MCH: 32 pg (ref 26.6–33.0)
MCHC: 33.7 g/dL (ref 31.5–35.7)
MCV: 95 fL (ref 79–97)
Monocytes Absolute: 0.6 10*3/uL (ref 0.1–0.9)
Monocytes: 6 %
Neutrophils Absolute: 4.9 10*3/uL (ref 1.4–7.0)
Neutrophils: 56 %
Platelets: 231 10*3/uL (ref 150–450)
RBC: 4.35 x10E6/uL (ref 4.14–5.80)
RDW: 12.5 % (ref 11.6–15.4)
WBC: 8.8 10*3/uL (ref 3.4–10.8)

## 2023-08-17 LAB — COMPREHENSIVE METABOLIC PANEL WITH GFR
ALT: 31 IU/L (ref 0–44)
AST: 22 IU/L (ref 0–40)
Albumin: 4 g/dL (ref 3.9–4.9)
Alkaline Phosphatase: 101 IU/L (ref 44–121)
BUN/Creatinine Ratio: 14 (ref 10–24)
BUN: 12 mg/dL (ref 8–27)
Bilirubin Total: 0.4 mg/dL (ref 0.0–1.2)
CO2: 24 mmol/L (ref 20–29)
Calcium: 9.5 mg/dL (ref 8.6–10.2)
Chloride: 98 mmol/L (ref 96–106)
Creatinine, Ser: 0.83 mg/dL (ref 0.76–1.27)
Globulin, Total: 1.8 g/dL (ref 1.5–4.5)
Glucose: 92 mg/dL (ref 70–99)
Potassium: 4.2 mmol/L (ref 3.5–5.2)
Sodium: 137 mmol/L (ref 134–144)
Total Protein: 5.8 g/dL — ABNORMAL LOW (ref 6.0–8.5)
eGFR: 95 mL/min/{1.73_m2} (ref >59)

## 2023-08-18 ENCOUNTER — Encounter: Payer: Self-pay | Admitting: Nurse Practitioner

## 2023-08-19 ENCOUNTER — Telehealth: Admitting: Nurse Practitioner

## 2023-08-19 ENCOUNTER — Ambulatory Visit: Payer: Self-pay

## 2023-08-19 ENCOUNTER — Encounter: Payer: Self-pay | Admitting: Nurse Practitioner

## 2023-08-19 VITALS — Wt 214.0 lb

## 2023-08-19 DIAGNOSIS — T50905D Adverse effect of unspecified drugs, medicaments and biological substances, subsequent encounter: Secondary | ICD-10-CM | POA: Diagnosis not present

## 2023-08-19 MED ORDER — PREDNISONE 10 MG PO TABS: 40.0000 mg | ORAL_TABLET | Freq: Every day | ORAL | 0 refills | Status: DC

## 2023-08-19 NOTE — Progress Notes (Signed)
 Wt 214 lb (97.1 kg)   BMI 30.71 kg/m    Subjective:    Patient ID: Charles Bryant, male    DOB: 1956-01-07, 67 y.o.   MRN: [id number]  HPI: Charles Bryant is a 68 y.o. male  Chief Complaint  Patient presents with   Rash    Has returned since last night. Itching and wonders if related to Pradaxa.    RASH Patient seen today with worsening hives all over his body but especially his torso.  He was recently hospitalized with similar reaction to Eliquis.  Patient is not having SOB.  However, his skin is itching and red.   Relevant past medical, surgical, family and social history reviewed and updated as indicated. Interim medical history since our last visit reviewed. Allergies and medications reviewed and updated.  Review of Systems  Skin:  Positive for rash.    Per HPI unless specifically indicated above     Objective:    Wt 214 lb (97.1 kg)   BMI 30.71 kg/m   Wt Readings from Last 3 Encounters:  08/19/23 214 lb (97.1 kg)  08/16/23 214 lb 3.2 oz (97.2 kg)  08/10/23 227 lb 8.2 oz (103.2 kg)    Physical Exam Vitals and nursing note reviewed.  Constitutional:      General: He is not in acute distress.    Appearance: He is not ill-appearing.  HENT:     Head: Normocephalic.     Right Ear: Hearing normal.     Left Ear: Hearing normal.     Nose: Nose normal.  Pulmonary:     Effort: Pulmonary effort is normal. No respiratory distress.  Neurological:     Mental Status: He is alert and oriented to person, place, and time.  Psychiatric:        Mood and Affect: Mood normal.        Behavior: Behavior normal.        Thought Content: Thought content normal.        Judgment: Judgment normal.     Results for orders placed or performed in visit on 08/16/23  Comp Met (CMET)   Collection Time: 08/16/23  9:41 AM  Result Value Ref Range   Glucose 92 70 - 99 mg/dL   BUN 12 8 - 27 mg/dL   Creatinine, Ser 1.30 0.76 - 1.27 mg/dL   eGFR 95 >86 VH/QIO/9.62   BUN/Creatinine Ratio 14  10 - 24   Sodium 137 134 - 144 mmol/L   Potassium 4.2 3.5 - 5.2 mmol/L   Chloride 98 96 - 106 mmol/L   CO2 24 20 - 29 mmol/L   Calcium 9.5 8.6 - 10.2 mg/dL   Total Protein 5.8 (L) 6.0 - 8.5 g/dL   Albumin 4.0 3.9 - 4.9 g/dL   Globulin, Total 1.8 1.5 - 4.5 g/dL   Bilirubin Total 0.4 0.0 - 1.2 mg/dL   Alkaline Phosphatase 101 44 - 121 IU/L   AST 22 0 - 40 IU/L   ALT 31 0 - 44 IU/L  CBC w/Diff   Collection Time: 08/16/23  9:41 AM  Result Value Ref Range   WBC 8.8 3.4 - 10.8 x10E3/uL   RBC 4.35 4.14 - 5.80 x10E6/uL   Hemoglobin 13.9 13.0 - 17.7 g/dL   Hematocrit 95.2 84.1 - 51.0 %   MCV 95 79 - 97 fL   MCH 32.0 26.6 - 33.0 pg   MCHC 33.7 31.5 - 35.7 g/dL   RDW 32.4 40.1 - 02.7 %  Platelets 231 150 - 450 x10E3/uL   Neutrophils 56 Not Estab. %   Lymphs 29 Not Estab. %   Monocytes 6 Not Estab. %   Eos 7 Not Estab. %   Basos 1 Not Estab. %   Neutrophils Absolute 4.9 1.4 - 7.0 x10E3/uL   Lymphocytes Absolute 2.6 0.7 - 3.1 x10E3/uL   Monocytes Absolute 0.6 0.1 - 0.9 x10E3/uL   EOS (ABSOLUTE) 0.6 (H) 0.0 - 0.4 x10E3/uL   Basophils Absolute 0.1 0.0 - 0.2 x10E3/uL   Immature Granulocytes 1 Not Estab. %   Immature Grans (Abs) 0.1 0.0 - 0.1 x10E3/uL      Assessment & Plan:   Problem List Items Addressed This Visit   None Visit Diagnoses       Adverse effect of drug, subsequent encounter    -  Primary   Will treat with Prednisone 40mg  x 5 days.  Follow up next week for reevaluation.   Relevant Orders   Ambulatory referral to Dermatology        Follow up plan: No follow-ups on file.  This visit was completed via MyChart due to the restrictions of the COVID-19 pandemic. All issues as above were discussed and addressed. Physical exam was done as above through visual confirmation on MyChart. If it was felt that the patient should be evaluated in the office, they were directed there. The patient verbally consented to this visit. Location of the patient: Home Location of the  provider: Office Those involved with this call:  Provider: Larae Grooms, NP CMA: Arnetha Gula, CMA Front Desk/Registration: Servando Snare This encounter was conducted via video.  I spent 20 dedicated to the care of this patient on the date of this encounter to include previsit review of symptoms, plan of care and follow up, face to face time with the patient, and post visit ordering of testing.

## 2023-08-22 ENCOUNTER — Encounter: Payer: Self-pay | Admitting: Nurse Practitioner

## 2023-08-22 ENCOUNTER — Ambulatory Visit (INDEPENDENT_AMBULATORY_CARE_PROVIDER_SITE_OTHER): Admitting: Nurse Practitioner

## 2023-08-22 VITALS — BP 96/63 | HR 70 | Temp 97.8°F | Wt 208.8 lb

## 2023-08-22 DIAGNOSIS — L509 Urticaria, unspecified: Secondary | ICD-10-CM | POA: Diagnosis not present

## 2023-08-22 MED ORDER — FAMOTIDINE 20 MG PO TABS: 20.0000 mg | ORAL_TABLET | Freq: Two times a day (BID) | ORAL | 1 refills | Status: DC

## 2023-08-22 MED ORDER — PREDNISONE 10 MG PO TABS: 10.0000 mg | ORAL_TABLET | Freq: Every day | ORAL | 0 refills | Status: DC

## 2023-08-22 NOTE — Assessment & Plan Note (Signed)
 Ongoing issue since March 31.  Patient was hospitalized with rash that was hive like but ruled out as Rogue Clear Syndrome.  He was switched from Eliquis to Pradaxa.  Symptoms returned on 08/19/23.  At this point, it does not appear to be a result of a drug reaction.  Will extend course of Prednisone for an additional 12 day taper.  Will start Famotidine 20mg  BID and stop Benadryl and start Zyrtec 20mg  BID.  Follow up in 2 weeks to check labs.

## 2023-08-22 NOTE — Progress Notes (Signed)
 BP 96/63   Pulse 70   Temp 97.8 F (36.6 C) (Oral)   Wt 208 lb 12.8 oz (94.7 kg)   SpO2 97%   BMI 29.96 kg/m    Subjective:    Patient ID: Charles Bryant, male    DOB: [birth year], 68 y.o.   MRN: [id number]  HPI: Charles Bryant is a 68 y.o. male  Chief Complaint  Patient presents with   Rash   Patient presents to clinic with complaints of ongoing rash.  This has been an ongoing concern since March 31.  Patient completed prednisone after hospitalization where Trudie Buckler syndrome was ruled out.  Patient was seen virtually on 08/19/23 with Hives that returned although Eliquis was changed to Pradaxa. Patient was given 5 day prednisone burst at that time.    Patient presents to clinic today with complaints of ongoing rash.  Although his skin is less reactive today.  He did not realize that he was having difficulty swallowing and urinating until he started the prednisone and the symptoms resolved.  He does not have difficulty swallowing or urinating today.  He is afraid his symptoms will return once he stops the prednisone after tomorrow.      Relevant past medical, surgical, family and social history reviewed and updated as indicated. Interim medical history since our last visit reviewed. Allergies and medications reviewed and updated.  Review of Systems  HENT:  Positive for trouble swallowing.   Genitourinary:  Positive for difficulty urinating.  Skin:  Positive for rash.    Per HPI unless specifically indicated above     Objective:    BP 96/63   Pulse 70   Temp 97.8 F (36.6 C) (Oral)   Wt 208 lb 12.8 oz (94.7 kg)   SpO2 97%   BMI 29.96 kg/m   Wt Readings from Last 3 Encounters:  08/22/23 208 lb 12.8 oz (94.7 kg)  08/19/23 214 lb (97.1 kg)  08/16/23 214 lb 3.2 oz (97.2 kg)    Physical Exam Vitals and nursing note reviewed.  Constitutional:      General: He is not in acute distress.    Appearance: Normal appearance. He is not ill-appearing, toxic-appearing or  diaphoretic.  HENT:     Head: Normocephalic.     Right Ear: External ear normal.     Left Ear: External ear normal.     Nose: Nose normal. No congestion or rhinorrhea.     Mouth/Throat:     Mouth: Mucous membranes are moist.  Eyes:     General:        Right eye: No discharge.        Left eye: No discharge.     Extraocular Movements: Extraocular movements intact.     Conjunctiva/sclera: Conjunctivae normal.     Pupils: Pupils are equal, round, and reactive to light.  Cardiovascular:     Rate and Rhythm: Normal rate and regular rhythm.     Heart sounds: No murmur heard. Pulmonary:     Effort: Pulmonary effort is normal. No respiratory distress.     Breath sounds: Normal breath sounds. No wheezing, rhonchi or rales.  Abdominal:     General: Abdomen is flat. Bowel sounds are normal.  Musculoskeletal:     Cervical back: Normal range of motion and neck supple.  Skin:    General: Skin is warm and dry.     Capillary Refill: Capillary refill takes less than 2 seconds.     Comments: Skin is red,  dry, resolving hives, open areas where blisters have erupted and are resolving  Neurological:     General: No focal deficit present.     Mental Status: He is alert and oriented to person, place, and time.  Psychiatric:        Mood and Affect: Mood normal.        Behavior: Behavior normal.        Thought Content: Thought content normal.        Judgment: Judgment normal.     Results for orders placed or performed in visit on 08/16/23  Comp Met (CMET)   Collection Time: 08/16/23  9:41 AM  Result Value Ref Range   Glucose 92 70 - 99 mg/dL   BUN 12 8 - 27 mg/dL   Creatinine, Ser 8.11 0.76 - 1.27 mg/dL   eGFR 95 >91 YN/WGN/5.62   BUN/Creatinine Ratio 14 10 - 24   Sodium 137 134 - 144 mmol/L   Potassium 4.2 3.5 - 5.2 mmol/L   Chloride 98 96 - 106 mmol/L   CO2 24 20 - 29 mmol/L   Calcium 9.5 8.6 - 10.2 mg/dL   Total Protein 5.8 (L) 6.0 - 8.5 g/dL   Albumin 4.0 3.9 - 4.9 g/dL   Globulin,  Total 1.8 1.5 - 4.5 g/dL   Bilirubin Total 0.4 0.0 - 1.2 mg/dL   Alkaline Phosphatase 101 44 - 121 IU/L   AST 22 0 - 40 IU/L   ALT 31 0 - 44 IU/L  CBC w/Diff   Collection Time: 08/16/23  9:41 AM  Result Value Ref Range   WBC 8.8 3.4 - 10.8 x10E3/uL   RBC 4.35 4.14 - 5.80 x10E6/uL   Hemoglobin 13.9 13.0 - 17.7 g/dL   Hematocrit 13.0 86.5 - 51.0 %   MCV 95 79 - 97 fL   MCH 32.0 26.6 - 33.0 pg   MCHC 33.7 31.5 - 35.7 g/dL   RDW 78.4 69.6 - 29.5 %   Platelets 231 150 - 450 x10E3/uL   Neutrophils 56 Not Estab. %   Lymphs 29 Not Estab. %   Monocytes 6 Not Estab. %   Eos 7 Not Estab. %   Basos 1 Not Estab. %   Neutrophils Absolute 4.9 1.4 - 7.0 x10E3/uL   Lymphocytes Absolute 2.6 0.7 - 3.1 x10E3/uL   Monocytes Absolute 0.6 0.1 - 0.9 x10E3/uL   EOS (ABSOLUTE) 0.6 (H) 0.0 - 0.4 x10E3/uL   Basophils Absolute 0.1 0.0 - 0.2 x10E3/uL   Immature Granulocytes 1 Not Estab. %   Immature Grans (Abs) 0.1 0.0 - 0.1 x10E3/uL      Assessment & Plan:   Problem List Items Addressed This Visit       Musculoskeletal and Integument   Hives - Primary   Ongoing issue since March 31.  Patient was hospitalized with rash that was hive like but ruled out as Trudie Buckler Syndrome.  He was switched from Eliquis to Pradaxa.  Symptoms returned on 08/19/23.  At this point, it does not appear to be a result of a drug reaction.  Will extend course of Prednisone for an additional 12 day taper.  Will start Famotidine 20mg  BID and stop Benadryl and start Zyrtec 20mg  BID.  Follow up in 2 weeks to check labs.        Relevant Orders   Ambulatory referral to Immunology     Follow up plan: Return in about 12 days (around 09/03/2023) for Allergic Reaction FU.   A total of  30 minutes were spent on this encounter today.  When total time is documented, this includes both the face-to-face and non-face-to-face time personally spent before, during and after the visit on the date of the encounter plan of care, medications,  symptoms and follow up.

## 2023-08-23 ENCOUNTER — Ambulatory Visit: Attending: Cardiology | Admitting: Cardiology

## 2023-08-23 ENCOUNTER — Encounter: Payer: Self-pay | Admitting: Cardiology

## 2023-08-23 VITALS — BP 122/80 | HR 108 | Ht 70.0 in | Wt 206.0 lb

## 2023-08-23 DIAGNOSIS — I4819 Other persistent atrial fibrillation: Secondary | ICD-10-CM

## 2023-08-23 DIAGNOSIS — I2510 Atherosclerotic heart disease of native coronary artery without angina pectoris: Secondary | ICD-10-CM

## 2023-08-23 DIAGNOSIS — D6869 Other thrombophilia: Secondary | ICD-10-CM | POA: Diagnosis not present

## 2023-08-23 DIAGNOSIS — I10 Essential (primary) hypertension: Secondary | ICD-10-CM

## 2023-08-23 NOTE — Progress Notes (Signed)
 Electrophysiology Office Note:   Date:  08/23/2023  ID:  Charles Bryant, DOB 1955/09/02, MRN [id number]  Primary Cardiologist: Nanetta Batty, MD Primary Heart Failure: None Electrophysiologist: None      History of Present Illness:   Charles Bryant is a 68 y.o. male with h/o carotid artery disease post endarterectomy in 2008, coronary disease, tobacco abuse, hypertension, hyperlipidemia, COPD, atrial fibrillation seen today for  for Electrophysiology evaluation of atrial fibrillation at the request of Jorja Loa.    He saw his primary physician in January for evaluation of irregular heart rates based on his Apple watch.  He was found to be in atrial fibrillation at that time.  He was started on Eliquis but developed a rash and was changed to Pradaxa.  He is post cardioversion 07/13/2023.  On follow-up in atrial fibrillation clinic, he was found to be in atrial fibrillation.  He is unaware of his arrhythmia.  He was well rate controlled.  Today, denies symptoms of palpitations, chest pain, shortness of breath, orthopnea, PND, lower extremity edema, claudication, dizziness, presyncope, syncope, bleeding, or neurologic sequela. The patient is tolerating medications without difficulties.  Potentially has some fatigue.  He has not complained of shortness of breath.  He continues to be able to do his daily activities.  He prefer a rhythm control strategy.  Initially he opted for dofetilide, but with further research, he feels that ablation would be more appropriate.  Review of systems complete and found to be negative unless listed in HPI.   EP Information / Studies Reviewed:    EKG is not ordered today. EKG from 08/08/2023 reviewed which showed atrial fibrillation        Risk Assessment/Calculations:    CHA2DS2-VASc Score = 3   This indicates a 3.2% annual risk of stroke. The patient's score is based upon: CHF History: 0 HTN History: 1 Diabetes History: 0 Stroke History: 0 Vascular Disease  History: 1 Age Score: 1 Gender Score: 0             Physical Exam:   VS:  There were no vitals taken for this visit.   Wt Readings from Last 3 Encounters:  08/22/23 208 lb 12.8 oz (94.7 kg)  08/19/23 214 lb (97.1 kg)  08/16/23 214 lb 3.2 oz (97.2 kg)     GEN: Well nourished, well developed in no acute distress NECK: No JVD; No carotid bruits CARDIAC: Irregularly irregular rate and rhythm, no murmurs, rubs, gallops RESPIRATORY:  Clear to auscultation without rales, wheezing or rhonchi  ABDOMEN: Soft, non-tender, non-distended EXTREMITIES:  No edema; No deformity   ASSESSMENT AND PLAN:    1.  Persistent atrial fibrillation: On Toprol-XL 50 mg daily.  He is in atrial fibrillation today with some possible fatigue.  He would prefer rhythm control.  He has had issues with medications in the past and would like to avoid antiarrhythmics.  Due to that, we Zniyah Midkiff plan for ablation.  Risk, benefits, and alternatives to EP study and radiofrequency/pulse field ablation for afib were also discussed in detail today. These risks include but are not limited to stroke, bleeding, vascular damage, tamponade, perforation, damage to the esophagus, lungs, and other structures, pulmonary vein stenosis, worsening renal function, and death. The patient understands these risk and wishes to proceed.  We Elishia Kaczorowski therefore proceed with catheter ablation at the next available time.  Carto, ICE, anesthesia are requested for the procedure.  Keyah Blizard also obtain CT PV protocol prior to the procedure to exclude LAA thrombus  and further evaluate atrial anatomy.  2.  Secondary hypercoagulable state: Continue Pradaxa for atrial fibrillation  3.  Hypertension: Well-controlled  4.  Snoring: Plan sleep study  5.  Coronary artery disease: No current anginal symptoms.  Plan per primary cardiology  Follow up with Afib Clinic as usual post procedure  Signed, Maleke Feria Cortland Ding, MD

## 2023-08-23 NOTE — Patient Instructions (Signed)
 Medication Instructions:  Your physician recommends that you continue on your current medications as directed. Please refer to the Current Medication list given to you today.  *If you need a refill on your cardiac medications before your next appointment, please call your pharmacy*  Lab Work: CBC and BMET - please have pre-procedure lab work completed on Monday 10/24/2023 . This can be done at ANY [facility] near you - no appointment required and this does not have to be fasting. If you have labs (blood work) drawn today and your tests are completely normal, you will receive your results only by: MyChart Message (if you have MyChart) OR A paper copy in the mail If you have any lab test that is abnormal or we need to change your treatment, we will call you to review the results.  Testing/Procedures: Cardiac CT - someone will contact you to schedule this  Your physician has requested that you have cardiac CT. Cardiac computed tomography (CT) is a painless test that uses an x-ray machine to take clear, detailed pictures of your heart. For further information please visit [email or web address] Please follow instruction sheet as given.   Atrial Fibrillation Ablation - scheduled on Thursday, 11/17/2023 We will be in contact closer to your ablation date with further instructions Your physician has recommended that you have an ablation. Catheter ablation is a medical procedure used to treat some cardiac arrhythmias (irregular heartbeats). During catheter ablation, a long, thin, flexible tube is put into a blood vessel in your groin (upper thigh), or neck. This tube is called an ablation catheter. It is then guided to your heart through the blood vessel. Radio frequency waves destroy small areas of heart tissue where abnormal heartbeats may cause an arrhythmia to start. Please see the instruction sheet given to you today.  Follow-Up: At [facility], you and your health needs are our priority.  As  part of our continuing mission to provide you with exceptional heart care, our providers are all part of one team.  This team includes your primary Cardiologist (physician) and Advanced Practice Providers or APPs (Physician Assistants and Nurse Practitioners) who all work together to provide you with the care you need, when you need it.  Your next appointment:   We will schedule follow up after your ablation  Provider:   Dr Lawana Pray   Cardiac Ablation Cardiac ablation is a procedure to destroy, or ablate, a small amount of heart tissue that is causing problems. The heart has many electrical connections. Sometimes, these connections are abnormal and can cause the heart to beat very fast or irregularly. Ablating the abnormal areas can improve the heart's rhythm or return it to normal. Ablation may be done for people who: Have irregular or rapid heartbeats (arrhythmias). Have Wolff-Parkinson-White syndrome. Have taken medicines for an arrhythmia that did not work or caused side effects. Have a high-risk heartbeat that may be life-threatening. Tell a health care provider about: Any allergies you have. All medicines you are taking, including vitamins, herbs, eye drops, creams, and over-the-counter medicines. Any problems you or family members have had with anesthesia. Any bleeding problems you have. Any surgeries you have had. Any medical conditions you have. Whether you are pregnant or may be pregnant. What are the risks? Your health care provider will talk with you about risks. These may include: Infection. Bruising and bleeding. Stroke or blood clots. Damage to nearby structures or organs. Allergic reaction to medicines or dyes. Needing a pacemaker if the heart gets damaged. A pacemaker  is a device that helps the heart beat normally. Failure of the procedure. A repeat procedure may be needed. What happens before the procedure? Medicines Ask your health care provider about: Changing or  stopping your regular medicines. These include any heart rhythm medicines, diabetes medicines, or blood thinners you take. Taking medicines such as aspirin and ibuprofen. These medicines can thin your blood. Do not take them unless your health care provider tells you to. Taking over-the-counter medicines, vitamins, herbs, and supplements. General instructions Follow instructions from your health care provider about what you may eat and drink. If you will be going home right after the procedure, plan to have a responsible adult: Take you home from the hospital or clinic. You will not be allowed to drive. Care for you for the time you are told. Ask your health care provider what steps will be taken to prevent infection. What happens during the procedure?  An IV will be inserted into one of your veins. You may be given: A sedative. This helps you relax. Anesthesia. This will: Numb certain areas of your body. An incision will be made in your neck or your groin. A needle will be inserted through the incision and into a large vein in your neck or groin. The small, thin tube (catheter) will be inserted through the needle and moved to your heart. A type of X-ray (fluoroscopy) will be used to help guide the catheter and provide images of the heart on a monitor. Dye may be injected through the catheter to help your surgeon see the area of the heart that needs treatment. Electrical currents will be sent from the catheter to destroy heart tissue in certain areas. There are three types of energy that may be used to do this: Heat (radiofrequency energy). Laser energy. Extreme cold (cryoablation). When the tissue has been destroyed, the catheter will be removed. Pressure will be held on the insertion area to prevent bleeding. A bandage (dressing) will be placed over the insertion area. The procedure may vary among health care providers and hospitals. What happens after the procedure? Your blood  pressure, heart rate and rhythm, breathing rate, and blood oxygen level will be monitored until you leave the hospital or clinic. Your insertion area will be checked for bleeding. You will need to lie still for a few hours. If your groin was used, you will need to keep your leg straight for a few hours after the catheter is removed. This information is not intended to replace advice given to you by your health care provider. Make sure you discuss any questions you have with your health care provider. Document Revised: 10/13/2021 Document Reviewed: 10/13/2021 Elsevier Patient Education  2024 [employer].

## 2023-08-24 ENCOUNTER — Ambulatory Visit: Admitting: Pediatrics

## 2023-08-24 ENCOUNTER — Encounter: Payer: Self-pay | Admitting: Pediatrics

## 2023-08-24 ENCOUNTER — Other Ambulatory Visit (HOSPITAL_COMMUNITY)
Admission: RE | Admit: 2023-08-24 | Discharge: 2023-08-24 | Disposition: A | Discharge status: Home / Self Care | Source: Ambulatory Visit | Attending: Pediatrics | Admitting: Pediatrics

## 2023-08-24 VITALS — BP 110/74 | HR 71 | Temp 98.4°F | Wt 210.2 lb

## 2023-08-24 DIAGNOSIS — R21 Rash and other nonspecific skin eruption: Secondary | ICD-10-CM

## 2023-08-24 NOTE — Progress Notes (Signed)
 Punch Biopsy Procedure Note  Pre-operative Diagnosis: coalescent erythematous patches and plaques after eliquis initiation, presumed drug rash not resolving after 3 weeks of steroids.  Location: left lower abdomen plaque   Patient informed of the risks (including bleeding and infection) and benefits of the  procedure and verbal informed consent obtained.  Patient had the opportunities to ask questions and they were addressed.  Anesthesia: Lidocaine 1% with epinephrine, 4 ml  Procedure Details  The skin was cleaned with alcohol and local anesthetic was administered with adequate analgesia. The lesion and surrounding area was given a sterile prep using chlorhexidine and draped in the usual sterile fashion.  The lesion was removed using the 5 mm punch.  The resulting ellipse was then closed with 4-0 prolene using x2 simple interrupted stitches.  Antibiotic ointment and a sterile dressing applied.  The specimen was sent for pathologic examination.  The patient tolerated the procedure well.  Complications: none.  Plan: 1. Instructed to keep the wound dry and covered for 24-48h and clean thereafter. 2. Warning signs of infection were reviewed.   3. Recommended that the patient use OTC analgesics as needed for pain.  4. Return for suture removal in 7 days.  Suture removal and information: Anatomic area   Days until removal External suture size Absorable suture size Face   3-5   5-0 or 6-0  5-0 Scalp   10   4-0, staples  3-0 Upper body  7-10   4-0   4-0 Hand   7-10   4-0   4-0 Lower Body  10-14   4-0   3-0 Other joint  14-21   4-0   3-0 (Consider splint)  Hadassah Letters, MD

## 2023-08-24 NOTE — Patient Instructions (Signed)
 Sutures ok to remove after 7-10 days. Keep area clean, dry and covered for the next 24-48 hours.  If you develop severe redness, bleeding, pain in the area, please call our office and seek medical attention.  We will let you know what the biopsy says.

## 2023-08-26 LAB — SURGICAL PATHOLOGY

## 2023-08-29 ENCOUNTER — Ambulatory Visit (HOSPITAL_COMMUNITY): Admitting: Physician Assistant

## 2023-09-02 ENCOUNTER — Encounter: Payer: Self-pay | Admitting: Nurse Practitioner

## 2023-09-02 ENCOUNTER — Ambulatory Visit (INDEPENDENT_AMBULATORY_CARE_PROVIDER_SITE_OTHER): Admitting: Nurse Practitioner

## 2023-09-02 VITALS — BP 117/70 | HR 90 | Temp 98.0°F | Ht 70.0 in | Wt 210.8 lb

## 2023-09-02 DIAGNOSIS — L509 Urticaria, unspecified: Secondary | ICD-10-CM | POA: Diagnosis not present

## 2023-09-02 MED ORDER — PREDNISONE 10 MG PO TABS: 10.0000 mg | ORAL_TABLET | Freq: Every day | ORAL | 0 refills | Status: DC

## 2023-09-02 MED ORDER — CLOBETASOL PROPIONATE 0.05 % EX CREA
1.0000 | TOPICAL_CREAM | Freq: Two times a day (BID) | CUTANEOUS | 1 refills | Status: AC
Start: 2023-09-02 — End: ?

## 2023-09-02 NOTE — Assessment & Plan Note (Signed)
 Ongoing problem.  Will slow down taper. Will treat with Prednisone  40mg  for 1 week, 30mg  for 1 week, 20mg  for 1 week and 10mg  for 1 week.  Will also treat with clobetasol cream.  Follow up in 10 days.

## 2023-09-02 NOTE — Progress Notes (Signed)
 BP 117/70   Pulse 90   Temp 98 F (36.7 C) (Oral)   Ht 5\' 10"  (1.778 m)   Wt 210 lb 12.8 oz (95.6 kg)   SpO2 95%   BMI 30.25 kg/m    Subjective:    Patient ID: Charles Bryant, male    DOB: 03-25-1956, 68 y.o.   MRN: [id number]  HPI: Charles Bryant is a 68 y.o. male  Chief Complaint  Patient presents with   Urticaria   Patient presents to clinic with continued hives and itching after recent drug reaction.  He came off the Eliquis  on March 12.  Since that time he has been battling hives, itches that comes and goes depending on his dose of prednisone .  Patient was on a 12 day taper and went from 40mg  to 30mg  his itching increased.      Relevant past medical, surgical, family and social history reviewed and updated as indicated. Interim medical history since our last visit reviewed. Allergies and medications reviewed and updated.  Review of Systems  Skin:  Positive for rash.    Per HPI unless specifically indicated above     Objective:    BP 117/70   Pulse 90   Temp 98 F (36.7 C) (Oral)   Ht 5\' 10"  (1.778 m)   Wt 210 lb 12.8 oz (95.6 kg)   SpO2 95%   BMI 30.25 kg/m   Wt Readings from Last 3 Encounters:  09/02/23 210 lb 12.8 oz (95.6 kg)  08/24/23 210 lb 3.2 oz (95.3 kg)  08/23/23 206 lb (93.4 kg)    Physical Exam Vitals and nursing note reviewed.  Constitutional:      General: He is not in acute distress.    Appearance: Normal appearance. He is not ill-appearing, toxic-appearing or diaphoretic.  HENT:     Head: Normocephalic.     Right Ear: External ear normal.     Left Ear: External ear normal.     Nose: Nose normal. No congestion or rhinorrhea.     Mouth/Throat:     Mouth: Mucous membranes are moist.  Eyes:     General:        Right eye: No discharge.        Left eye: No discharge.     Extraocular Movements: Extraocular movements intact.     Conjunctiva/sclera: Conjunctivae normal.     Pupils: Pupils are equal, round, and reactive to light.   Cardiovascular:     Rate and Rhythm: Normal rate and regular rhythm.     Heart sounds: No murmur heard. Pulmonary:     Effort: Pulmonary effort is normal. No respiratory distress.     Breath sounds: Normal breath sounds. No wheezing, rhonchi or rales.  Abdominal:     General: Abdomen is flat. Bowel sounds are normal.  Musculoskeletal:     Cervical back: Normal range of motion and neck supple.  Skin:    General: Skin is warm and dry.     Capillary Refill: Capillary refill takes less than 2 seconds.     Findings: Rash present.     Comments: Dry skin  Neurological:     General: No focal deficit present.     Mental Status: He is alert and oriented to person, place, and time.  Psychiatric:        Mood and Affect: Mood normal.        Behavior: Behavior normal.        Thought Content: Thought content normal.  Judgment: Judgment normal.     Results for orders placed or performed in visit on 08/24/23  Surgical pathology   Collection Time: 08/24/23 10:49 AM  Result Value Ref Range   SURGICAL PATHOLOGY      SURGICAL PATHOLOGY CASE: [id number] PATIENT: Phyllip Jepsen Surgical Pathology Report     Clinical History: history of presumed drug reaction 3 weeks ago, recurs and worsens when off steroids (cm)     FINAL MICROSCOPIC DIAGNOSIS:  A. 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  Within the dermis there is a dense superficial and mid perivascular chronic inflammatory cell infiltrate with rare scattered eosinophils.  The surface hyperparakeratosis suggest prior spongiosis.  The differential diagnosis would include a drug reaction or possible contact hypersensitivity reaction/dermatitis. Clinical correlation is recommended.   GROSS DESCRIPTION:  Received fresh is a 0.5 x 0.4 x 0.3  cm fragment of tan, wrinkled skin, without a grossly distinct lesion.  The specimen is entirely submit ted in 1 block (bisected). (KW, 08/25/2023)  Final Diagnosis performed by Judithann Novas, MD.   Electronically signed 08/26/2023 Technical and / or Professional components performed at [facility]. [facility], [address] [place], [place] [address].  Immunohistochemistry Technical component (if applicable) was performed at [facility]. [address], [place], [place] [address].   IMMUNOHISTOCHEMISTRY DISCLAIMER (if applicable): Some of these immunohistochemical stains may have been developed and the performance characteristics determine by [facility]. Some may not have been cleared or approved by the U.S. Food and Drug Administration. The FDA has determined that such clearance or approval is not necessary. This test is used for clinical purposes. It should not be regarded as investigational or for research. This laboratory is certified under the Clinical Laboratory Improvement Amendments of 1988 (CLIA-88) as qualifi ed to perform high complexity clinical laboratory testing.  The controls stained appropriately.   IHC stains are performed on formalin fixed, paraffin embedded tissue using a 3,3"diaminobenzidine (DAB) chromogen and Leica Bond Autostainer System. The staining intensity of the nucleus is score manually and is reported as the percentage of tumor cell nuclei demonstrating specific nuclear staining. The specimens are fixed in 10% Neutral Formalin for at least 6 hours and up to 72hrs. These tests are validated on decalcified tissue. Results should be interpreted with caution given the possibility of false negative results on decalcified specimens. Antibody Clones are as follows ER-clone 66F, PR-clone 16, Ki67- clone MM1. Some of these immunohistochemical stains may have been developed and the performance characteristics  determined by [facility].       Assessment & Plan:   Problem List Items Addressed This Visit       Musculoskeletal and Integument   Hives - Primary   Ongoing problem.  Will slow down taper. Will treat with Prednisone  40mg  for 1 week, 30mg  for 1 week, 20mg  for 1 week and 10mg  for 1 week.  Will also treat with clobetasol cream.  Follow up in 10 days.        Follow up plan: Return in about 10 days (around 09/12/2023) for Rash.

## 2023-09-09 ENCOUNTER — Other Ambulatory Visit: Payer: Self-pay | Admitting: Nurse Practitioner

## 2023-09-09 MED ORDER — PREDNISONE 10 MG PO TABS: 10.0000 mg | ORAL_TABLET | Freq: Every day | ORAL | 0 refills | Status: DC

## 2023-09-10 ENCOUNTER — Other Ambulatory Visit: Payer: Self-pay | Admitting: Nurse Practitioner

## 2023-09-13 NOTE — Telephone Encounter (Signed)
 Requested Prescriptions  Pending Prescriptions Disp Refills   lisinopril  (ZESTRIL ) 20 MG tablet [Pharmacy Med Name: Lisinopril  20 MG Oral Tablet] 100 tablet 1    Sig: TAKE 1 TABLET BY MOUTH DAILY     Cardiovascular:  ACE Inhibitors Passed - 09/13/2023 12:01 PM      Passed - Cr in normal range and within 180 days    Creatinine, Ser  Date Value Ref Range Status  08/16/2023 0.83 0.76 - 1.27 mg/dL Final         Passed - K in normal range and within 180 days    Potassium  Date Value Ref Range Status  08/16/2023 4.2 3.5 - 5.2 mmol/L Final         Passed - Patient is not pregnant      Passed - Last BP in normal range    BP Readings from Last 1 Encounters:  09/02/23 117/70         Passed - Valid encounter within last 6 months    Recent Outpatient Visits           1 week ago Hives   [facility] Aileen Alexanders, NP   2 weeks ago Rash   [facility] Hadassah Letters, MD   3 weeks ago Hives   [facility] Aileen Alexanders, NP   3 weeks ago Adverse effect of drug, subsequent encounter   [facility] Aileen Alexanders, NP   4 weeks ago Hospital discharge follow-up   [facility] Aileen Alexanders, NP       Future Appointments             In 1 month Katheryne Pane, Frederico Jan, MD [facility] at [facility] of [facility] [employer], [employer]

## 2023-09-15 ENCOUNTER — Encounter: Payer: Self-pay | Admitting: Cardiology

## 2023-09-15 ENCOUNTER — Encounter: Payer: Self-pay | Admitting: Nurse Practitioner

## 2023-09-15 ENCOUNTER — Ambulatory Visit (INDEPENDENT_AMBULATORY_CARE_PROVIDER_SITE_OTHER): Admitting: Nurse Practitioner

## 2023-09-15 VITALS — BP 115/72 | HR 109 | Temp 98.0°F | Ht 70.0 in | Wt 213.2 lb

## 2023-09-15 DIAGNOSIS — Z0000 Encounter for general adult medical examination without abnormal findings: Secondary | ICD-10-CM | POA: Diagnosis not present

## 2023-09-15 DIAGNOSIS — L509 Urticaria, unspecified: Secondary | ICD-10-CM | POA: Diagnosis not present

## 2023-09-15 DIAGNOSIS — Z7189 Other specified counseling: Secondary | ICD-10-CM | POA: Diagnosis not present

## 2023-09-15 DIAGNOSIS — I482 Chronic atrial fibrillation, unspecified: Secondary | ICD-10-CM

## 2023-09-15 NOTE — Progress Notes (Signed)
 Subjective:   Charles Bryant is a 68 y.o. male who presents for Medicare Annual/Subsequent preventive examination.  Visit Complete: In person  Patient Medicare AWV questionnaire was completed by the patient on 09/15/23 ; I have confirmed that all information answered by patient is correct and no changes since this date.  Cardiac Risk Factors include: advanced age (>74men, >26 women);male gender;hypertension     Objective:    Today's Vitals   09/15/23 1608  BP: 115/72  Pulse: (!) 109  Temp: 98 F (36.7 C)  TempSrc: Oral  SpO2: 97%  Weight: 213 lb 3.2 oz (96.7 kg)  Height: 5\' 10"  (1.778 m)  PainSc: 0-No pain   Body mass index is 30.59 kg/m.     09/15/2023    4:15 PM 08/08/2023    3:20 PM 07/13/2023    8:35 AM 04/13/2023   12:58 AM 07/06/2022    8:49 AM 05/28/2021    9:49 AM 05/23/2020    1:07 PM  Advanced Directives  Does Patient Have a Medical Advance Directive? Yes No Yes No No Yes Yes  Type of Estate agent of [place];Living will  Healthcare Power of [employer] Power of [employer] Power of [place];Living will  Copy of Healthcare Power of Attorney in Chart? No - copy requested  No - copy requested   No - copy requested No - copy requested  Would patient like information on creating a medical advance directive?  No - Patient declined   No - Patient declined      Current Medications (verified) Outpatient Encounter Medications as of 09/15/2023  Medication Sig   amLODipine  (NORVASC ) 10 MG tablet Take 1 tablet (10 mg total) by mouth daily.   atorvastatin  (LIPITOR) 40 MG tablet TAKE 1 TABLET BY MOUTH DAILY   dabigatran  (PRADAXA ) 150 MG CAPS capsule TAKE 1 CAPSULE BY MOUTH TWICE A DAY   diphenhydrAMINE  (BENADRYL ) 25 mg capsule Take 1 capsule (25 mg total) by mouth every 8 (eight) hours as needed for itching or allergies.   famotidine  (PEPCID ) 20 MG tablet Take 1 tablet (20 mg total) by mouth 2 (two) times daily.   fluticasone -salmeterol  (WIXELA INHUB) 500-50 MCG/ACT AEPB USE 1 INHALATION BY MOUTH  TWICE DAILY (Patient taking differently: Inhale 1 puff into the lungs in the morning and at bedtime. USE 1 INHALATION BY MOUTH  TWICE DAILY)   furosemide  (LASIX ) 20 MG tablet Take 20 mg by mouth daily as needed.   hydrochlorothiazide  (HYDRODIURIL ) 12.5 MG tablet Take 12.5 mg by mouth daily.   ipratropium-albuterol  (DUONEB) 0.5-2.5 (3) MG/3ML SOLN Take 3 mLs by nebulization every 6 (six) hours as needed.   lisinopril  (ZESTRIL ) 20 MG tablet TAKE 1 TABLET BY MOUTH DAILY   metoprolol  succinate (TOPROL -XL) 50 MG 24 hr tablet TAKE 1 TABLET BY MOUTH DAILY  WITH OR IMMEDIATELY FOLLOWING A  MEAL (Patient taking differently: Take 50 mg by mouth daily. TAKE 1 TABLET BY MOUTH DAILY  WITH OR IMMEDIATELY FOLLOWING A  MEAL)   omeprazole  (PRILOSEC) 40 MG capsule Take 1 capsule (40 mg total) by mouth daily.   predniSONE  (DELTASONE ) 10 MG tablet Take 1 tablet (10 mg total) by mouth daily with breakfast. 30mg  for 7 days, 20mg  for 7 days, and 10mg  for 7 days   tadalafil  (CIALIS ) 20 MG tablet Take 1 tablet (20 mg total) by mouth daily as needed for erectile dysfunction.   VENTOLIN  HFA 108 (90 Base) MCG/ACT inhaler USE 2 INHALATIONS BY MOUTH EVERY 6  HOURS AS NEEDED FOR WHEEZING  OR SHORTNESS OF BREATH (Patient taking differently: Inhale 2 puffs into the lungs every 6 (six) hours as needed for wheezing or shortness of breath.)   clobetasol  cream (TEMOVATE ) 0.05 % Apply 1 Application topically 2 (two) times daily. (Patient not taking: Reported on 09/15/2023)   [DISCONTINUED] potassium chloride  SA (KLOR-CON  M) 20 MEQ tablet Take 1 tablet (20 mEq total) by mouth daily. (Patient not taking: Reported on 09/15/2023)   No facility-administered encounter medications on file as of 09/15/2023.    Allergies (verified) Eliquis  [apixaban ]   History: Past Medical History:  Diagnosis Date   Advanced care planning/counseling discussion 07/02/2022   Allergy Childhood   Asthma     COPD (chronic obstructive pulmonary disease) (HCC)    COPD (chronic obstructive pulmonary disease) (HCC)    GERD (gastroesophageal reflux disease)    Hyperlipidemia    Hypertension    Past Surgical History:  Procedure Laterality Date   CARDIOVERSION N/A 07/13/2023   Procedure: CARDIOVERSION;  Surgeon: Jerryl Morin, DO;  Location: [facility] INVASIVE CV [facility];  Service: Cardiovascular;  Laterality: N/A;   carotid surgery  2009   CATARACT EXTRACTION Bilateral 09/2021   EYE SURGERY  6 years ago   Blepharoplasty   Family History  Problem Relation Age of Onset   COPD Mother    Hypertension Mother    Stroke Mother    Cancer Father    Asthma Father    Asthma Brother    Cancer Brother    Heart disease Maternal Grandmother    Stroke Maternal Grandfather    Hyperkalemia Maternal Grandfather    Heart disease Maternal Grandfather    Social History   Socioeconomic History   Marital status: Married    Spouse name: Not on file   Number of children: Not on file   Years of education: Not on file   Highest education level: Professional school degree (e.g., MD, DDS, DVM, JD)  Occupational History   Not on file  Tobacco Use   Smoking status: Former    Current packs/day: 0.00    Average packs/day: 2.0 packs/day for 29.5 years (59.0 ttl pk-yrs)    Types: Cigarettes    Start date: 05/10/1969    Quit date: 10/28/1998    Years since quitting: 24.8   Smokeless tobacco: Never   Tobacco comments:    Former smoker 07/20/23  Vaping Use   Vaping status: Never Used  Substance and Sexual Activity   Alcohol use: Yes    Alcohol/week: 3.0 standard drinks of alcohol    Types: 3 Cans of beer per week    Comment: 2 beers depending on week 07/20/23   Drug use: Never   Sexual activity: Yes  Other Topics Concern   Not on file  Social History Narrative   Not on file   Social Drivers of Health   Financial Resource Strain: Low Risk  (05/31/2023)   Overall Financial Resource Strain (CARDIA)    Difficulty of  Paying Living Expenses: Not hard at all  Food Insecurity: No Food Insecurity (08/09/2023)   Hunger Vital Sign    Worried About Running Out of Food in the Last Year: Never true    Ran Out of Food in the Last Year: Never true  Transportation Needs: No Transportation Needs (08/09/2023)   PRAPARE - Administrator, Civil Service (Medical): No    Lack of Transportation (Non-Medical): No  Physical Activity: Inactive (05/31/2023)   Exercise Vital Sign    Days  of Exercise per Week: 0 days    Minutes of Exercise per Session: 0 min  Stress: No Stress Concern Present (05/31/2023)   [employer] of Occupational Health - Occupational Stress Questionnaire    Feeling of Stress : Not at all  Social Connections: Moderately Integrated (08/09/2023)   Social Connection and Isolation Panel [NHANES]    Frequency of Communication with Friends and Family: Twice a week    Frequency of Social Gatherings with Friends and Family: Once a week    Attends Religious Services: Never    Database administrator or Organizations: Yes    Attends Banker Meetings: 1 to 4 times per year    Marital Status: Married    Tobacco Counseling Counseling given: Not Answered Tobacco comments: Former smoker 07/20/23   Clinical Intake:     Pain Score: 0-No pain                  Activities of Daily Living    09/15/2023    4:16 PM 09/11/2023    8:41 AM  In your present state of health, do you have any difficulty performing the following activities:  Hearing? 0 0  Vision? 0 0  Difficulty concentrating or making decisions? 0 0  Walking or climbing stairs? 0 0  Dressing or bathing? 0 0  Doing errands, shopping? 0 0  Preparing Food and eating ? N N  Using the Toilet? N N  In the past six months, have you accidently leaked urine? N N  Do you have problems with loss of bowel control? N N  Managing your Medications? N N  Managing your Finances? N N  Housekeeping or managing your Housekeeping? N  N    Patient Care Team: Aileen Alexanders, NP as PCP - General Katheryne Pane Frederico Jan, MD as PCP - Cardiology (Cardiology)  Indicate any recent Medical Services you may have received from other than [facility] providers in the past year (date may be approximate).     Assessment:   This is a routine wellness examination for [name].  Hearing/Vision screen No results found.   Goals Addressed   None   Depression Screen    08/22/2023   11:26 AM 08/19/2023    8:18 AM 06/01/2023   10:59 AM 02/08/2023    8:38 AM 07/06/2022    8:47 AM 07/02/2022    8:15 AM 12/30/2021    8:10 AM  PHQ 2/9 Scores  PHQ - 2 Score 0 0 0 0 0 0 0  PHQ- 9 Score 0  1 0 0 1 0    Fall Risk    09/11/2023    8:41 AM 08/22/2023   11:25 AM 08/19/2023    8:18 AM 08/16/2023    9:18 AM 07/06/2022    8:51 AM  Fall Risk   Falls in the past year? 1 0 0 0 1  Number falls in past yr: 0 0 0 0 0  Injury with Fall? 1 0 0 0 0  Risk for fall due to :  No Fall Risks No Fall Risks No Fall Risks History of fall(s)  Follow up  Falls evaluation completed Falls evaluation completed Falls evaluation completed Falls evaluation completed;Falls prevention discussed    MEDICARE RISK AT HOME: Medicare Risk at Home Any stairs in or around the home?: (Patient-Rptd) Yes If so, are there any without handrails?: (Patient-Rptd) No Home free of loose throw rugs in walkways, pet beds, electrical cords, etc?: (Patient-Rptd) Yes Adequate lighting in your  home to reduce risk of falls?: (Patient-Rptd) Yes Life alert?: (Patient-Rptd) No Use of a cane, walker or w/c?: (Patient-Rptd) No Grab bars in the bathroom?: (Patient-Rptd) Yes Shower chair or bench in shower?: (Patient-Rptd) Yes Elevated toilet seat or a handicapped toilet?: (Patient-Rptd) No  TIMED UP AND GO:  Was the test performed?  Yes  Length of time to ambulate 10 feet: 8 sec Gait steady and fast without use of assistive device    Cognitive Function:        09/15/2023    4:13 PM 07/06/2022     8:57 AM 05/23/2020    1:09 PM  6CIT Screen  What Year? 0 points 0 points 0 points  What month? 0 points 0 points 0 points  What time? 0 points 0 points 0 points  Count back from 20 0 points 0 points 0 points  Months in reverse 0 points 0 points 0 points  Repeat phrase 0 points 0 points 2 points  Total Score 0 points 0 points 2 points    Immunizations Immunization History  Administered Date(s) Administered   Fluad Trivalent(High Dose 65+) 01/24/2023   Influenza, High Dose Seasonal PF 02/01/2022   Influenza-Unspecified 02/22/2019, 02/04/2020, 01/22/2021   PFIZER(Purple Top)SARS-COV-2 Vaccination 02/04/2020, 08/13/2020, 01/22/2021, 09/30/2021, 09/30/2021   PNEUMOCOCCAL CONJUGATE-20 12/30/2021   Pfizer(Comirnaty)Fall Seasonal Vaccine 12 years and older 08/02/2022, 01/24/2023   Pneumococcal Conjugate-13 12/18/2019   Rsv, Bivalent, Protein Subunit Rsvpref,pf Pattricia Bores) 04/28/2022   Tdap 07/14/2016   Zoster Recombinant(Shingrix) 07/08/2016, 02/23/2017    TDAP status: Up to date  Flu Vaccine status: Up to date  Pneumococcal vaccine status: Due, Education has been provided regarding the importance of this vaccine. Advised may receive this vaccine at local pharmacy or Health Dept. Aware to provide a copy of the vaccination record if obtained from local pharmacy or Health Dept. Verbalized acceptance and understanding.  Covid-19 vaccine status: Completed vaccines  Qualifies for Shingles Vaccine? Yes   Zostavax completed No  Shingrix Completed?: Yes  Screening Tests Health Maintenance  Topic Date Due   COVID-19 Vaccine (8 - 2024-25 season) 07/24/2023   Fecal DNA (Cologuard)  09/24/2023   INFLUENZA VACCINE  12/09/2023   Medicare Annual Wellness (AWV)  09/14/2024   DTaP/Tdap/Td (2 - Td or Tdap) 07/15/2026   Pneumonia Vaccine 41+ Years old  Completed   Hepatitis C Screening  Completed   Zoster Vaccines- Shingrix  Completed   HPV VACCINES  Aged Out   Meningococcal B Vaccine  Aged Out     Health Maintenance  Health Maintenance Due  Topic Date Due   COVID-19 Vaccine (8 - 2024-25 season) 07/24/2023    Colorectal cancer screening: Type of screening: Cologuard. Completed 03/172022. Repeat every 3 years  Lung Cancer Screening: (Low Dose CT Chest recommended if Age 66-80 years, 20 pack-year currently smoking OR have quit w/in 15years.) does not qualify.   Additional Screening:  Hepatitis C Screening: does qualify; Completed 12/18/2019  Vision Screening: Recommended annual ophthalmology exams for early detection of glaucoma and other disorders of the eye. Is the patient up to date with their annual eye exam?  No  If pt is not established with a provider, would they like to be referred to a provider to establish care? No .   Dental Screening: Recommended annual dental exams for proper oral hygiene    Community Resource Referral / Chronic Care Management: CRR required this visit?  No   CCM required this visit?  No     Plan:  I have personally reviewed and noted the following in the patient's chart:   Medical and social history Use of alcohol, tobacco or illicit drugs  Current medications and supplements including opioid prescriptions. Patient is not currently taking opioid prescriptions. Functional ability and status Nutritional status Physical activity Advanced directives List of other physicians Hospitalizations, surgeries, and ER visits in previous 12 months Vitals Screenings to include cognitive, depression, and falls Referrals and appointments  In addition, I have reviewed and discussed with patient certain preventive protocols, quality metrics, and best practice recommendations. A written personalized care plan for preventive services as well as general preventive health recommendations were provided to patient.     Darnella Elder, CMA   09/15/2023   After Visit Summary: (In Person-Printed) AVS printed and given to the patient

## 2023-09-15 NOTE — Telephone Encounter (Signed)
 Dr. Lawana Pray please respond to patient directly as I am out of the office tomorrow.  Thanks

## 2023-09-15 NOTE — Progress Notes (Signed)
 BP 115/72 (BP Location: Left Arm, Patient Position: Sitting, Cuff Size: Large)   Pulse (!) 109   Temp 98 F (36.7 C) (Oral)   Ht 5\' 10"  (1.778 m)   Wt 213 lb 3.2 oz (96.7 kg)   SpO2 97%   BMI 30.59 kg/m    Subjective:    Patient ID: Charles Bryant, male    DOB: October 16, 1955, 68 y.o.   MRN: [id number]  HPI: Charles Bryant is a 68 y.o. male  Chief Complaint  Patient presents with   Medicare Annual Wellness    RASH Patient seen today to follow up on his hives that have been ongoing secondary to a drug rash that occurred in March.  The predisnose course was extended 60mg  for 1 week, 50mg  for 1 week, 40mg  for 1 week, 30mg  for 1 week, 20mg  for 1 week and 10mg  for 1 week.  Patient just dropped from 30mg  down to 20mg .  The hives have not returned as they have in the past when he dropped the dose.  Denies itching, redness, shortness of breath or difficulty swallowing.    Patient will have an ablation on July 17 for the Afib.  The goal is to come off the Pradaxa  after that time.  Relevant past medical, surgical, family and social history reviewed and updated as indicated. Interim medical history since our last visit reviewed. Allergies and medications reviewed and updated.  Review of Systems  HENT:  Negative for trouble swallowing.   Respiratory:  Negative for shortness of breath.   Skin:  Negative for rash.    Per HPI unless specifically indicated above     Objective:     BP 115/72 (BP Location: Left Arm, Patient Position: Sitting, Cuff Size: Large)   Pulse (!) 109   Temp 98 F (36.7 C) (Oral)   Ht 5\' 10"  (1.778 m)   Wt 213 lb 3.2 oz (96.7 kg)   SpO2 97%   BMI 30.59 kg/m   Wt Readings from Last 3 Encounters:  09/15/23 213 lb 3.2 oz (96.7 kg)  09/02/23 210 lb 12.8 oz (95.6 kg)  08/24/23 210 lb 3.2 oz (95.3 kg)    Physical Exam Vitals and nursing note reviewed.  Constitutional:      General: He is not in acute distress.    Appearance: Normal appearance. He is not  ill-appearing, toxic-appearing or diaphoretic.  HENT:     Head: Normocephalic.     Right Ear: External ear normal.     Left Ear: External ear normal.     Nose: Nose normal. No congestion or rhinorrhea.     Mouth/Throat:     Mouth: Mucous membranes are moist.  Eyes:     General:        Right eye: No discharge.        Left eye: No discharge.     Extraocular Movements: Extraocular movements intact.     Conjunctiva/sclera: Conjunctivae normal.     Pupils: Pupils are equal, round, and reactive to light.  Cardiovascular:     Rate and Rhythm: Normal rate and regular rhythm.     Heart sounds: No murmur heard. Pulmonary:     Effort: Pulmonary effort is normal. No respiratory distress.     Breath sounds: Normal breath sounds. No wheezing, rhonchi or rales.  Abdominal:     General: Abdomen is flat. Bowel sounds are normal.  Musculoskeletal:     Cervical back: Normal range of motion and neck supple.  Skin:  General: Skin is warm and dry.     Capillary Refill: Capillary refill takes less than 2 seconds.     Comments: Skin is still healing from hives.    Neurological:     General: No focal deficit present.     Mental Status: He is alert and oriented to person, place, and time.  Psychiatric:        Mood and Affect: Mood normal.        Behavior: Behavior normal.        Thought Content: Thought content normal.        Judgment: Judgment normal.     Results for orders placed or performed in visit on 08/24/23  Surgical pathology   Collection Time: 08/24/23 10:49 AM  Result Value Ref Range   SURGICAL PATHOLOGY      SURGICAL PATHOLOGY CASE: [id number] PATIENT: Charles Bryant Surgical Pathology Report     Clinical History: history of presumed drug reaction 3 weeks ago, recurs and worsens when off steroids (cm)     FINAL MICROSCOPIC DIAGNOSIS:  A. 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  Within the dermis there is a dense superficial and mid perivascular chronic inflammatory cell infiltrate with rare scattered eosinophils.  The surface hyperparakeratosis suggest prior spongiosis.  The differential diagnosis would include a drug reaction or possible contact hypersensitivity reaction/dermatitis. Clinical correlation is recommended.   GROSS DESCRIPTION:  Received fresh is a 0.5 x 0.4 x 0.3 cm fragment of tan, wrinkled skin, without a grossly distinct lesion.  The specimen is entirely submit ted in 1 block (bisected). (KW, 08/25/2023)  Final Diagnosis performed by Judithann Novas, MD.   Electronically signed 08/26/2023 Technical and / or Professional components performed at [facility]. [facility], [address] [place], [place] [address].  Immunohistochemistry Technical component (if applicable) was performed at [facility]. [address], [place], [place] [address].   IMMUNOHISTOCHEMISTRY DISCLAIMER (if applicable): Some of these immunohistochemical stains may have been developed and the performance characteristics determine by [facility]. Some may not have been cleared or approved by the U.S. Food and Drug Administration. The FDA has determined that such clearance or approval is not necessary. This test is used for clinical purposes. It should not be regarded as investigational or for research. This laboratory is certified under the Clinical Laboratory Improvement Amendments of 1988 (CLIA-88) as qualifi ed to perform high complexity clinical laboratory testing.  The controls stained appropriately.   IHC stains are performed on formalin fixed, paraffin embedded tissue using a 3,3"diaminobenzidine (DAB) chromogen and Leica Bond Autostainer System. The staining intensity of the nucleus is score  manually and is reported as the percentage of tumor cell nuclei demonstrating specific nuclear staining. The specimens are fixed in 10% Neutral Formalin for at least 6 hours and up to 72hrs. These tests are validated on decalcified tissue. Results should be interpreted with caution given the possibility of false negative results on decalcified specimens. Antibody Clones are as follows ER-clone [id number], PR-clone 16, Ki67- clone MM1. Some of these immunohistochemical stains may have been developed and the performance characteristics determined by [facility].       Assessment & Plan:   Problem List Items Addressed This Visit       Cardiovascular and Mediastinum   Atrial fibrillation, chronic (HCC)   Chronic.  Ongoing.  Will have  ablation on July 10. Note reviewed from Cardiology.         Musculoskeletal and Integument   Hives   Improving.  Continue with prednisone  taper.  He reports doing well with the slow taper. Follow up in 1 month. Call sooner if concerns arise.         Other   Advanced care planning/counseling discussion   A voluntary discussion about advance care planning including the explanation and discussion of advance directives was extensively discussed  with the patient for 5 minutes with patient.  Explanation about the health care proxy and Living will was reviewed and packet with forms with explanation of how to fill them out was given.  During this discussion, the patient was able to identify a health care proxy as his wife and does not wish to make changes to paperwork.       Other Visit Diagnoses       Encounter for Medicare annual wellness exam    -  Primary        Follow up plan: Return in 3 months (on 12/16/2023) for HTN, HLD, DM2 FU.

## 2023-09-16 ENCOUNTER — Ambulatory Visit: Admitting: Pediatrics

## 2023-09-16 NOTE — Assessment & Plan Note (Signed)
 Chronic.  Ongoing.  Will have ablation on July 10. Note reviewed from Cardiology.

## 2023-09-16 NOTE — Assessment & Plan Note (Signed)
 Improving.  Continue with prednisone  taper.  He reports doing well with the slow taper. Follow up in 1 month. Call sooner if concerns arise.

## 2023-09-16 NOTE — Assessment & Plan Note (Addendum)
 A voluntary discussion about advance care planning including the explanation and discussion of advance directives was extensively discussed  with the patient for 5 minutes with patient.  Explanation about the health care proxy and Living will was reviewed and packet with forms with explanation of how to fill them out was given.  During this discussion, the patient was able to identify a health care proxy as his wife and does not wish to make changes to paperwork.

## 2023-09-19 ENCOUNTER — Encounter: Payer: Self-pay | Admitting: Nurse Practitioner

## 2023-09-20 ENCOUNTER — Other Ambulatory Visit: Payer: Self-pay | Admitting: Nurse Practitioner

## 2023-09-20 MED ORDER — ATORVASTATIN CALCIUM 40 MG PO TABS: 40.0000 mg | ORAL_TABLET | Freq: Every day | ORAL | 0 refills | Status: DC

## 2023-09-22 NOTE — Addendum Note (Signed)
 Addended by: Alvenia Aus on: 09/22/2023 10:38 AM   Modules accepted: Orders

## 2023-09-22 NOTE — Telephone Encounter (Signed)
 Spoke to pt, aware cancelling procedure date/CT. Aware office will contact him to arrange OV w/ [facility] to discuss ablation & watchman. Patient verbalized understanding and agreeable to plan.

## 2023-09-24 ENCOUNTER — Other Ambulatory Visit: Payer: Self-pay | Admitting: Nurse Practitioner

## 2023-09-25 ENCOUNTER — Other Ambulatory Visit: Payer: Self-pay | Admitting: Nurse Practitioner

## 2023-09-26 ENCOUNTER — Telehealth: Payer: Self-pay | Admitting: *Deleted

## 2023-09-26 NOTE — Telephone Encounter (Signed)
 Pt made aware I would cancel 7/10 ablation w/ [facility] and any associated orders.  He will discuss next steps with Dr. Daneil Dunker at his 6/5 OV to discuss Watchman/Ablation.   Patient verbalized understanding and agreeable to plan.

## 2023-09-27 NOTE — Telephone Encounter (Signed)
 Requested medication (s) are due for refill today: yes  Requested medication (s) are on the active medication list: yes  Last refill:  08/09/23  Future visit scheduled: no  Notes to clinic:  Historical provider/medication, please review for refill       Requested Prescriptions  Pending Prescriptions Disp Refills   hydrochlorothiazide  (HYDRODIURIL ) 12.5 MG tablet [Pharmacy Med Name: hydroCHLOROthiazide  12.5 MG Oral Tablet] 100 tablet 2    Sig: TAKE 1 TABLET BY MOUTH DAILY     Cardiovascular: Diuretics - Thiazide Passed - 09/27/2023  1:22 PM      Passed - Cr in normal range and within 180 days    Creatinine, Ser  Date Value Ref Range Status  08/16/2023 0.83 0.76 - 1.27 mg/dL Final         Passed - K in normal range and within 180 days    Potassium  Date Value Ref Range Status  08/16/2023 4.2 3.5 - 5.2 mmol/L Final         Passed - Na in normal range and within 180 days    Sodium  Date Value Ref Range Status  08/16/2023 137 134 - 144 mmol/L Final         Passed - Last BP in normal range    BP Readings from Last 1 Encounters:  09/15/23 115/72         Passed - Valid encounter within last 6 months    Recent Outpatient Visits           3 weeks ago Hives   [place] [facility] Aileen Alexanders, NP   1 month ago Rash   [place] [facility] Hadassah Letters, MD   1 month ago Hives   [place] [facility] Aileen Alexanders, NP   1 month ago Adverse effect of drug, subsequent encounter   [place] [facility] Aileen Alexanders, NP   1 month ago Hospital discharge follow-up   [place] [facility] Aileen Alexanders, NP       Future Appointments             In 2 weeks Ardeen Kohler, MD [facility] at [facility] A Dept of The [place] [facility], [facility]   In 3 weeks Katheryne Pane, Frederico Jan, MD [facility] at [facility] A Dept of The [place]. [facility], [facility]

## 2023-09-27 NOTE — Telephone Encounter (Signed)
 Requested Prescriptions  Pending Prescriptions Disp Refills   metoprolol  succinate (TOPROL -XL) 50 MG 24 hr tablet [Pharmacy Med Name: Metoprolol  Succinate ER 50 MG Oral Tablet Extended Release 24 Hour] 100 tablet 1    Sig: TAKE 1 TABLET BY MOUTH DAILY  WITH OR IMMEDIATELY FOLLOWING A  MEAL     Cardiovascular:  Beta Blockers Passed - 09/27/2023  2:06 PM      Passed - Last BP in normal range    BP Readings from Last 1 Encounters:  09/15/23 115/72         Passed - Last Heart Rate in normal range    Pulse Readings from Last 1 Encounters:  09/15/23 (!) 109         Passed - Valid encounter within last 6 months    Recent Outpatient Visits           3 weeks ago [employer]   [place] [facility] Aileen Alexanders, NP   1 month ago Rash   [place] [facility] Hadassah Letters, MD   1 month ago Hives   [place] [facility] Aileen Alexanders, NP   1 month ago Adverse effect of drug, subsequent encounter   [place] [facility] Aileen Alexanders, NP   1 month ago Hospital discharge follow-up   [place] [facility] Aileen Alexanders, NP       Future Appointments             In 2 weeks Ardeen Kohler, MD [facility] at [facility] A Dept of The [place]. [facility], [facility]   In 3 weeks Katheryne Pane, Frederico Jan, MD [facility] at [facility] A Dept of The [place]. [facility], [facility]

## 2023-09-28 ENCOUNTER — Other Ambulatory Visit: Payer: Self-pay | Admitting: Nurse Practitioner

## 2023-09-28 NOTE — Telephone Encounter (Signed)
 Requested medication (s) are due for refill today - provider review   Requested medication (s) are on the active medication list -yes  Future visit scheduled -yes  Last refill: 09/09/23 #42  Notes to clinic: non delegated Rx  Requested Prescriptions  Pending Prescriptions Disp Refills   predniSONE  (DELTASONE ) 10 MG tablet [Pharmacy Med Name: PREDNISONE  10 MG TABLET] 42 tablet 0    Sig: TAKE BY MOUTH WITH BREAKFAST. TAKE 30MG  FOR 7 DAYS, 20MG  FOR 7 DAYS, AND 10MG  FOR 7 DAYS     Not Delegated - Endocrinology:  Oral Corticosteroids Failed - 09/28/2023  2:36 PM      Failed - This refill cannot be delegated      Failed - Manual Review: Eye exam for IOP if prolonged treatment      Failed - Bone Mineral Density or Dexa Scan completed in the last 2 years      Passed - Glucose (serum) in normal range and within 180 days    Glucose  Date Value Ref Range Status  08/16/2023 92 70 - 99 mg/dL Final   Glucose, Bld  Date Value Ref Range Status  08/10/2023 119 (H) 70 - 99 mg/dL Final    Comment:    Glucose reference range applies only to samples taken after fasting for at least 8 hours.         Passed - K in normal range and within 180 days    Potassium  Date Value Ref Range Status  08/16/2023 4.2 3.5 - 5.2 mmol/L Final         Passed - Na in normal range and within 180 days    Sodium  Date Value Ref Range Status  08/16/2023 137 134 - 144 mmol/L Final         Passed - Last BP in normal range    BP Readings from Last 1 Encounters:  09/15/23 115/72         Passed - Valid encounter within last 6 months    Recent Outpatient Visits           3 weeks ago Hives   [place] [facility] Aileen Alexanders, NP   1 month ago Rash   [place] [facility] Hadassah Letters, MD   1 month ago Hives   [place] [facility] Aileen Alexanders, NP   1 month ago Adverse effect of drug, subsequent encounter   [place] [facility]  Aileen Alexanders, NP   1 month ago Hospital discharge follow-up   [place] [facility] Aileen Alexanders, NP       Future Appointments             In 2 weeks Ardeen Kohler, MD [facility] at [facility] A Dept of The [place] [facility]. [facility], H&[facility]   In 3 weeks Katheryne Pane, Frederico Jan, MD [facility] at [facility] A Dept of The [place]. [facility]               Requested Prescriptions  Pending Prescriptions Disp Refills   predniSONE  (DELTASONE ) 10 MG tablet [Pharmacy Med Name: PREDNISONE  10 MG TABLET] 42 tablet 0    Sig: TAKE BY MOUTH WITH BREAKFAST. TAKE 30MG  FOR 7 DAYS, 20MG  FOR 7 DAYS, AND 10MG  FOR 7 DAYS     Not Delegated - Endocrinology:  Oral Corticosteroids Failed - 09/28/2023  2:36 PM      Failed - This refill cannot be delegated  Failed - Manual Review: Eye exam for IOP if prolonged treatment      Failed - Bone Mineral Density or Dexa Scan completed in the last 2 years      Passed - Glucose (serum) in normal range and within 180 days    Glucose  Date Value Ref Range Status  08/16/2023 92 70 - 99 mg/dL Final   Glucose, Bld  Date Value Ref Range Status  08/10/2023 119 (H) 70 - 99 mg/dL Final    Comment:    Glucose reference range applies only to samples taken after fasting for at least 8 hours.         Passed - K in normal range and within 180 days    Potassium  Date Value Ref Range Status  08/16/2023 4.2 3.5 - 5.2 mmol/L Final         Passed - Na in normal range and within 180 days    Sodium  Date Value Ref Range Status  08/16/2023 137 134 - 144 mmol/L Final         Passed - Last BP in normal range    BP Readings from Last 1 Encounters:  09/15/23 115/72         Passed - Valid encounter within last 6 months    Recent Outpatient Visits           3 weeks ago Hives   [place] [facility] Aileen Alexanders, NP   1 month ago Rash   [place] [facility] Hadassah Letters, MD   1 month ago Hives   [facility] Aileen Alexanders, NP   1 month ago Adverse effect of drug, subsequent encounter   [place] [facility] Aileen Alexanders, NP   1 month ago Hospital discharge follow-up   [place] [facility] Aileen Alexanders, NP       Future Appointments             In 2 weeks Ardeen Kohler, MD [facility] at [facility] A Dept of The [place] [facility], H[facility]   In 3 weeks Katheryne Pane, Frederico Jan, MD [facility] HeartCare at [facility] A Dept of The [place]. [facility], [facility]

## 2023-10-08 ENCOUNTER — Other Ambulatory Visit: Payer: Self-pay | Admitting: Nurse Practitioner

## 2023-10-10 NOTE — Telephone Encounter (Signed)
 Requested Prescriptions  Pending Prescriptions Disp Refills   amLODipine  (NORVASC ) 10 MG tablet [Pharmacy Med Name: amLODIPine  Besylate 10 MG Oral Tablet] 100 tablet 2    Sig: TAKE 1 TABLET BY MOUTH DAILY     Cardiovascular: Calcium  Channel Blockers 2 Passed - 10/10/2023  3:38 PM      Passed - Last BP in normal range    BP Readings from Last 1 Encounters:  09/15/23 115/72         Passed - Last Heart Rate in normal range    Pulse Readings from Last 1 Encounters:  09/15/23 (!) 109         Passed - Valid encounter within last 6 months    Recent Outpatient Visits           1 month ago [employer]   [place] [facility] Aileen Alexanders, NP   1 month ago Rash   [place] [facility] Hadassah Letters, MD   1 month ago Hives   [place] [facility] Aileen Alexanders, NP   1 month ago Adverse effect of drug, subsequent encounter   [place] [facility] Aileen Alexanders, NP   1 month ago Hospital discharge follow-up   [place] [facility] Aileen Alexanders, NP       Future Appointments             In 3 days Ardeen Kohler, MD [facility] at [facility] A Dept of The [place] [facility], H[facility]   In 2 weeks Katheryne Pane, Frederico Jan, MD [facility] HeartCare at [facility] A Dept of The [place]. [facility], H&[facility]

## 2023-10-13 ENCOUNTER — Ambulatory Visit: Attending: Cardiology | Admitting: Cardiology

## 2023-10-13 ENCOUNTER — Encounter: Payer: Self-pay | Admitting: Cardiology

## 2023-10-13 ENCOUNTER — Other Ambulatory Visit: Payer: Self-pay

## 2023-10-13 VITALS — BP 123/76 | HR 96 | Ht 70.0 in | Wt 205.0 lb

## 2023-10-13 DIAGNOSIS — D6869 Other thrombophilia: Secondary | ICD-10-CM | POA: Diagnosis not present

## 2023-10-13 DIAGNOSIS — I4819 Other persistent atrial fibrillation: Secondary | ICD-10-CM

## 2023-10-13 DIAGNOSIS — I5031 Acute diastolic (congestive) heart failure: Secondary | ICD-10-CM

## 2023-10-13 NOTE — Patient Instructions (Addendum)
 Medication Instructions:  Your physician recommends that you continue on your current medications as directed. Please refer to the Current Medication list given to you today.  *If you need a refill on your cardiac medications before your next appointment, please call your pharmacy*  Lab Work: BMET and CBC - you may go to any [facility] location to have these drawn within 30 days of your procedure  Testing/Procedures: Cardiac CT Your physician has requested that you have cardiac CT. Cardiac computed tomography (CT) is a painless test that uses an x-ray machine to take clear, detailed pictures of your heart. For further information please visit [email or web address] Please follow instruction sheet as given. We will call you to schedule your CT scan. It will be done about three weeks prior to your ablation.  Ablation Your physician has recommended that you have an ablation. Catheter ablation is a medical procedure used to treat some cardiac arrhythmias (irregular heartbeats). During catheter ablation, a long, thin, flexible tube is put into a blood vessel in your groin (upper thigh), or neck. This tube is called an ablation catheter. It is then guided to your heart through the blood vessel. Radio frequency waves destroy small areas of heart tissue where abnormal heartbeats may cause an arrhythmia to start. You are scheduled for Atrial Fibrillation Ablation on Friday, August 8 with Dr. Clinton Danas.Please arrive at the Main Entrance A at [facility]: [address] 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 The LAA is a small sac in the wall of the heart's left upper chamber. Blood clots can form in this area. The device, Watchman closes the LAA to help prevent a blood clot and stroke.  You will be contacted by Nurse Navigator, Larkin Plumb to schedule your pre-procedure visit and procedure date. If you have any questions she can be reached at [phone].   Follow-Up: At [facility], you and your health needs are our priority.  As part of our continuing mission to provide you with exceptional heart care, we have created designated Provider Care Teams.  These Care Teams include your primary Cardiologist (physician) and Advanced Practice Providers (APPs -  Physician Assistants and Nurse Practitioners) who all work together to provide you with the care you need, when you need it.   Your next appointment:   We will contact you about your post-procedure follow up appointments.

## 2023-10-13 NOTE — Progress Notes (Signed)
 " Electrophysiology Office Note:   Date:  10/14/2023  ID:  Charles Bryant, DOB [birth year], MRN [id number]  Primary Cardiologist: Dorn Lesches, MD Electrophysiologist: Fonda Kitty, MD      History of Present Illness:   Charles Bryant is a 68 y.o. male with h/o carotid artery disease post endarterectomy in 2008, coronary disease, tobacco abuse, hypertension, hyperlipidemia, COPD, and persistent atrial fibrillation who is being seen today for evaluation for Watchman and ablation.   Discussed the use of AI scribe software for clinical note transcription with the patient, who gave verbal consent to proceed.  History of Present Illness He has a history of atrial fibrillation, initially detected by his iWatch and later confirmed with a twelve-lead ECG. He has been in atrial fibrillation for approximately six months, with the first episode occurring in January. He self-converted from afib once. He has experienced significant adverse reactions to blood thinners. An allergic reaction to Eliquis  resulted in an autoimmune response with thirty percent of his body covered in hives. He is currently on Pradaxa  but suspects it may also be causing a mild reaction. Due to these reactions and concerns about bleeding risks, he is interested in the Watchman procedure to avoid long-term use of blood thinners. He reports some swelling in his legs and has Lasix  available for use if needed. This has developed since he went into AF.   Review of systems complete and found to be negative unless listed in HPI.   EP Information / Studies Reviewed:    EKG is not ordered today. EKG from 08/08/23 reviewed which showed AF.      Echo 06/27/23:   1. Left ventricular ejection fraction, by estimation, is 60 to 65%. The  left ventricle has normal function. The left ventricle has no regional  wall motion abnormalities. Left ventricular diastolic function could not  be evaluated.   2. Right ventricular systolic function is normal. The  right ventricular  size is normal.   3. The mitral valve is normal in structure. No evidence of mitral valve  regurgitation. No evidence of mitral stenosis.   4. The aortic valve is normal in structure. Aortic valve regurgitation is  not visualized. No aortic stenosis is present.   5. The inferior vena cava is normal in size with greater than 50%  respiratory variability, suggesting right atrial pressure of 3 mmHg.    Risk Assessment/Calculations:    CHA2DS2-VASc Score = 4   This indicates a 4.8% annual risk of stroke. The patient's score is based upon: CHF History: 1 HTN History: 1 Diabetes History: 0 Stroke History: 0 Vascular Disease History: 1 Age Score: 1 Gender Score: 0             Physical Exam:   VS:  BP 123/76   Pulse 96   Ht 5' 10 (1.778 m)   Wt 205 lb (93 kg)   SpO2 96%   BMI 29.41 kg/m    Wt Readings from Last 3 Encounters:  10/13/23 205 lb (93 kg)  09/15/23 213 lb 3.2 oz (96.7 kg)  09/02/23 210 lb 12.8 oz (95.6 kg)     GEN: Well nourished, well developed in no acute distress NECK: No JVD; No carotid bruits CARDIAC: Normal rate, irregular rhythm RESPIRATORY:  Clear to auscultation without rales, wheezing or rhonchi  ABDOMEN: Soft, non-tender, non-distended EXTREMITIES:  1+ edema; No deformity   ASSESSMENT AND PLAN:   I have seen Charles Bryant in the office today who is being considered for  a Watchman left atrial appendage closure device. I believe they will benefit from this procedure given their history of atrial fibrillation, CHA2DS2-VASc score of 3 and unadjusted ischemic stroke rate of 3.2% per year. Unfortunately, the patient is not felt to be a long term anticoagulation candidate secondary to adverse allergic reactions to multiple oral anti-coagulants and bleeding. The patient's chart has been reviewed and I feel that they would be a candidate for short term oral anticoagulation after Watchman implant.   It is my belief that after undergoing a LAA  closure procedure, Charles Bryant will not need long term anticoagulation which eliminates anticoagulation side effects and major bleeding risk.   Procedural risks for the Watchman implant have been reviewed with the patient including a 0.5% risk of stroke, <1% risk of perforation and <1% risk of device embolization. Other risks include bleeding, vascular damage, tamponade, worsening renal function, and death. The patient understands these risk and wishes to proceed.     The published clinical data on the safety and effectiveness of WATCHMAN include but are not limited to the following: - Holmes DR, Jess BEARD, Sick P et al. for the PROTECT AF Investigators. Percutaneous closure of the left atrial appendage versus warfarin therapy for prevention of stroke in patients with atrial fibrillation: a randomised non-inferiority trial. Lancet 2009; 374: 534-42. GLENWOOD Jess BEARD, Doshi SK, Jonita VEAR Satchel D et al. on behalf of the PROTECT AF Investigators. Percutaneous Left Atrial Appendage Closure for Stroke Prophylaxis in Patients With Atrial Fibrillation 2.3-Year Follow-up of the PROTECT AF (Watchman Left Atrial Appendage System for Embolic Protection in Patients With Atrial Fibrillation) Trial. Circulation 2013; 127:720-729. - Alli O, Doshi S,  Kar S, Reddy VY, Sievert H et al. Quality of Life Assessment in the Randomized PROTECT AF (Percutaneous Closure of the Left Atrial Appendage Versus Warfarin Therapy for Prevention of Stroke in Patients With Atrial Fibrillation) Trial of Patients at Risk for Stroke With Nonvalvular Atrial Fibrillation. J Am Coll Cardiol 2013; 61:1790-8. GLENWOOD Satchel DR, Archer RAMAN, Price M, Whisenant B, Sievert H, Doshi S, Huber K, Reddy V. Prospective randomized evaluation of the Watchman left atrial appendage Device in patients with atrial fibrillation versus long-term warfarin therapy; the PREVAIL trial. Journal of the [employer] of Cardiology, Vol. 4, No. 1, 2014, 1-11. - Kar S, Doshi SK, Sadhu  A, Horton R, Osorio J et al. Primary outcome evaluation of a next-generation left atrial appendage closure device: results from the PINNACLE FLX trial. Circulation 2021;143(18)1754-1762.   HAS-BLED score: 3 Hypertension Yes  Abnormal renal and liver function (Dialysis, transplant, Cr >2.26 mg/dL /Cirrhosis or Bilirubin >2x Normal or AST/ALT/AP >3x Normal) No  Stroke No  Bleeding Yes  Labile INR (Unstable/high INR) No  Elderly (>65) Yes  Drugs or alcohol (>= 8 drinks/week, anti-plt or NSAID) No   CHA2DS2-VASc Score = 4  The patient's score is based upon: CHF History: 1 - NYHA class II HTN History: 1 Diabetes History: 0 Stroke History: 0 Vascular Disease History: 1 Age Score: 1 Gender Score: 0       ASSESSMENT AND PLAN: #. Persistent Atrial Fibrillation: Associated with diastolic heart failure. For this reason, we have prioritized a rhythm control strategy. -Discussed treatment options today for AF including antiarrhythmic drug therapy and ablation. Discussed risks, recovery and likelihood of success with each treatment strategy. Risk, benefits, and alternatives to EP study and ablation for afib were discussed. These risks include but are not limited to stroke, bleeding, vascular damage, tamponade, perforation, damage to  the esophagus, lungs, phrenic nerve and other structures, pulmonary vein stenosis, worsening renal function, coronary vasospasm and death.  Discussed potential need for repeat ablation procedures and antiarrhythmic drugs after an initial ablation. The patient understands these risk and wishes to proceed.  We will therefore proceed with catheter ablation at the next available time.  Carto, ICE, anesthesia are requested for the procedure.  Will also obtain CT PV protocol prior to the procedure to exclude LAA thrombus and further evaluate atrial anatomy. -Continue metoprolol  XL 50mg  daily.  #. Secondary Hypercoagulable State  - Did not tolerate Eliquis . Reports side effects  with Pradaxa  but able to take. Will continue for now.   #. Diastolic heart failure: Lower extremity edema. Likely precipitated by AF. -Instructed patient to take Lasix  until edema resolves. Then resume as needed.   After today's visit with the patient would like to proceed with concomitant AF ablation and Watchman implant. We discussed possibility of doing staged procedures if necessary in order to get ablation done faster - given that he has been in AF for near 6 months now and is developing edema. He is amenable to staged procedures if necessary. He would have ablation then Watchman 6-8 weeks later. We will obtain a gated CT scan of the chest with contrast timed for PV/LA visualization.   Follow up with Dr. Kennyth 3 months after [facility] implant.   Signed, Fonda Kennyth, MD  "

## 2023-10-14 ENCOUNTER — Encounter: Payer: Self-pay | Admitting: Nurse Practitioner

## 2023-10-14 MED ORDER — RIVAROXABAN 20 MG PO TABS: 20.0000 mg | ORAL_TABLET | Freq: Every day | ORAL | 0 refills | Status: DC

## 2023-10-21 ENCOUNTER — Encounter: Payer: Self-pay | Admitting: Cardiovascular Disease

## 2023-10-24 ENCOUNTER — Telehealth: Payer: Self-pay

## 2023-10-24 ENCOUNTER — Encounter: Payer: Self-pay | Admitting: Cardiovascular Disease

## 2023-10-24 ENCOUNTER — Ambulatory Visit: Payer: Medicare Other | Attending: Cardiovascular Disease | Admitting: Cardiovascular Disease

## 2023-10-24 ENCOUNTER — Other Ambulatory Visit: Payer: Self-pay

## 2023-10-24 VITALS — BP 140/90 | HR 98 | Ht 70.0 in | Wt 211.8 lb

## 2023-10-24 DIAGNOSIS — E782 Mixed hyperlipidemia: Secondary | ICD-10-CM | POA: Diagnosis not present

## 2023-10-24 DIAGNOSIS — I10 Essential (primary) hypertension: Secondary | ICD-10-CM | POA: Diagnosis not present

## 2023-10-24 DIAGNOSIS — I6521 Occlusion and stenosis of right carotid artery: Secondary | ICD-10-CM

## 2023-10-24 DIAGNOSIS — I482 Chronic atrial fibrillation, unspecified: Secondary | ICD-10-CM

## 2023-10-24 DIAGNOSIS — Z720 Tobacco use: Secondary | ICD-10-CM | POA: Diagnosis not present

## 2023-10-24 MED ORDER — WARFARIN SODIUM 5 MG PO TABS: 5.0000 mg | ORAL_TABLET | Freq: Every day | ORAL | 3 refills | Status: DC

## 2023-10-24 NOTE — Telephone Encounter (Signed)
 Lpmtcb to discuss transition from Xarelto  to Warfarin and cancel New Coumadin appt 6/17 @ 10:30

## 2023-10-24 NOTE — Assessment & Plan Note (Signed)
 History of hyperlipidemia on statin therapy with lipid profile performed 02/08/2023 revealing a total cholesterol 148, LDL 67 HDL 69.

## 2023-10-24 NOTE — Assessment & Plan Note (Signed)
 History of A-fib demonstrated by Katlyn West NP 07/08/2023.  He had successful DC cardioversion by Dr. Emmette Harms 07/13/2023 which did not last long.  Unfortunately, he is intolerant to Eliquis  and Xarelto  breaking out with skin rash.  He did see Dr. Lawana Pray and Dr. Daneil Dunker who felt that he was a good candidate for A-fib ablation and Watchman device.  Referring him to the Coumadin clinic to try and transition from Xarelto  to Coumadin oral anticoagulation.

## 2023-10-24 NOTE — Assessment & Plan Note (Signed)
 History of carotid artery disease status post right carotid endarterectomy performed at [facility] 2008 in [place] ..  His most recent carotid Dopplers performed 02/01/2022 showed a widely patent endarterectomy site.

## 2023-10-24 NOTE — Assessment & Plan Note (Signed)
 Discontinue tobacco abuse and 2000.

## 2023-10-24 NOTE — Progress Notes (Signed)
 10/24/2023 Charles Bryant   12-Mar-1956  [id number]  Primary Physician Aileen Alexanders, NP Primary Cardiologist: Avanell Leigh MD Bennye Bravo, [place]  HPI:  Charles Bryant is a 68 y.o.  mildly overweight married Caucasian male father of 2, grandfather of 4 grandchildren referred by Aileen Alexanders, NP, his primary care provider, for cardiovascular valuation because of known vascular disease.  He is accompanied by his wife [name] today.  I last saw him in the office 01/15/2022.  He relocated from [place]  to [facility] in May 2020. 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 His wife took a job as Catering manager of the Insurance claims handler at [employer]. His risk factors include discontinue tobacco use in 2020 having smoked 100 pack years, treated hypertension and hyperlipidemia. There is no family history of heart disease. Never had an attack or stroke. He did have a right carotid endarterectomy for asymptomatic carotid disease in [place]  at [facility] in 2008.  He saw Katlyn  West NP in the office 06/30/2023 who set him up for outpatient cardioversion 07/13/2023 by Dr. Emmette Harms.  This was successful but soon reverted back to A-fib.  He was placed on Eliquis  and subsequent on Xarelto  both of which she was intolerant to because of rash.  He has seen Dr. Lawana Pray and Dr. Daneil Dunker in the office to discuss A-fib ablation and Watchman device both of which are being scheduled.  I am referring him to the Coumadin clinic to transition him to oral anticoagulation with Coumadin because of his intolerance to DOAC.  He otherwise is asymptomatic and denies chest pain or shortness of breath.   Current Meds  Medication Sig   amLODipine  (NORVASC ) 10 MG tablet Take 1 tablet (10 mg total) by mouth daily.   atorvastatin  (LIPITOR) 40 MG tablet Take 1 tablet (40 mg total) by mouth daily.   clobetasol  cream (TEMOVATE ) 0.05 % Apply 1 Application topically 2 (two) times daily.   diphenhydrAMINE   (BENADRYL ) 25 mg capsule Take 1 capsule (25 mg total) by mouth every 8 (eight) hours as needed for itching or allergies.   famotidine  (PEPCID ) 20 MG tablet Take 1 tablet (20 mg total) by mouth 2 (two) times daily.   fluticasone -salmeterol (WIXELA INHUB) 500-50 MCG/ACT AEPB USE 1 INHALATION BY MOUTH  TWICE DAILY (Patient taking differently: Inhale 1 puff into the lungs in the morning and at bedtime. USE 1 INHALATION BY MOUTH  TWICE DAILY)   furosemide  (LASIX ) 20 MG tablet Take 20 mg by mouth daily as needed.   hydrochlorothiazide  (HYDRODIURIL ) 12.5 MG tablet TAKE 1 TABLET BY MOUTH DAILY   ipratropium-albuterol  (DUONEB) 0.5-2.5 (3) MG/3ML SOLN Take 3 mLs by nebulization every 6 (six) hours as needed.   lisinopril  (ZESTRIL ) 20 MG tablet TAKE 1 TABLET BY MOUTH DAILY   metoprolol  succinate (TOPROL -XL) 50 MG 24 hr tablet TAKE 1 TABLET BY MOUTH DAILY  WITH OR IMMEDIATELY FOLLOWING A  MEAL   omeprazole  (PRILOSEC) 40 MG capsule Take 1 capsule (40 mg total) by mouth daily.   predniSONE  (DELTASONE ) 10 MG tablet Take 1 tablet (10 mg total) by mouth daily with breakfast. 30mg  for 7 days, 20mg  for 7 days, and 10mg  for 7 days   rivaroxaban  (XARELTO ) 20 MG TABS tablet Take 1 tablet (20 mg total) by mouth daily with supper.   tadalafil  (CIALIS ) 20 MG tablet Take 1 tablet (20 mg total) by mouth daily as needed for erectile dysfunction.   VENTOLIN   HFA 108 (90 Base) MCG/ACT inhaler USE 2 INHALATIONS BY MOUTH EVERY 6 HOURS AS NEEDED FOR WHEEZING  OR SHORTNESS OF BREATH (Patient taking differently: Inhale 2 puffs into the lungs every 6 (six) hours as needed for wheezing or shortness of breath.)     Allergies  Allergen Reactions   Eliquis  [Apixaban ] Hives   Pradaxa  [Dabigatran  Etexilate Mesylate] Hives   Xarelto  [Rivaroxaban ] Hives    Social History   Socioeconomic History   Marital status: Married    Spouse name: Not on file   Number of children: Not on file   Years of education: Not on file   Highest  education level: Professional school degree (e.g., MD, DDS, DVM, JD)  Occupational History   Not on file  Tobacco Use   Smoking status: Former    Current packs/day: 0.00    Average packs/day: 2.0 packs/day for 29.6 years (59.1 ttl pk-yrs)    Types: Cigarettes    Start date: 05/10/1969    Quit date: 10/28/1998    Years since quitting: 25.0   Smokeless tobacco: Never   Tobacco comments:    Former smoker 07/20/23  Vaping Use   Vaping status: Never Used  Substance and Sexual Activity   Alcohol use: Yes    Alcohol/week: 3.0 standard drinks of alcohol    Types: 3 Cans of beer per week    Comment: 2 beers depending on week 07/20/23   Drug use: Never   Sexual activity: Yes  Other Topics Concern   Not on file  Social History Narrative   Not on file   Social Drivers of Health   Financial Resource Strain: Low Risk  (05/31/2023)   Overall Financial Resource Strain (CARDIA)    Difficulty of Paying Living Expenses: Not hard at all  Food Insecurity: No Food Insecurity (08/09/2023)   Hunger Vital Sign    Worried About Running Out of Food in the Last Year: Never true    Ran Out of Food in the Last Year: Never true  Transportation Needs: No Transportation Needs (08/09/2023)   PRAPARE - Administrator, Civil Service (Medical): No    Lack of Transportation (Non-Medical): No  Physical Activity: Unknown (05/31/2023)   Exercise Vital Sign    Days of Exercise per Week: 0 days    Minutes of Exercise per Session: Not on file  Recent Concern: Physical Activity - Inactive (05/31/2023)   Exercise Vital Sign    Days of Exercise per Week: 0 days    Minutes of Exercise per Session: 0 min  Stress: No Stress Concern Present (05/31/2023)   [employer] of Occupational Health - Occupational Stress Questionnaire    Feeling of Stress : Not at all  Social Connections: Moderately Integrated (08/09/2023)   Social Connection and Isolation Panel    Frequency of Communication with Friends and Family:  Twice a week    Frequency of Social Gatherings with Friends and Family: Once a week    Attends Religious Services: Never    Database administrator or Organizations: Yes    Attends Banker Meetings: 1 to 4 times per year    Marital Status: Married  Catering manager Violence: Not At Risk (08/09/2023)   Humiliation, Afraid, Rape, and Kick questionnaire    Fear of Current or Ex-Partner: No    Emotionally Abused: No    Physically Abused: No    Sexually Abused: No     Review of Systems: General: negative for chills, fever, night sweats  or weight changes.  Cardiovascular: negative for chest pain, dyspnea on exertion, edema, orthopnea, palpitations, paroxysmal nocturnal dyspnea or shortness of breath Dermatological: negative for rash Respiratory: negative for cough or wheezing Urologic: negative for hematuria Abdominal: negative for nausea, vomiting, diarrhea, bright red blood per rectum, melena, or hematemesis Neurologic: negative for visual changes, syncope, or dizziness All other systems reviewed and are otherwise negative except as noted above.    Blood pressure (!) 140/90, pulse 98, height 5' 10 (1.778 m), weight 211 lb 12.8 oz (96.1 kg), SpO2 97%.  General appearance: alert and no distress Neck: no adenopathy, no carotid bruit, no JVD, supple, symmetrical, trachea midline, and thyroid not enlarged, symmetric, no tenderness/mass/nodules Lungs: clear to auscultation bilaterally Heart: regular rate and rhythm, S1, S2 normal, no murmur, click, rub or gallop Extremities: extremities normal, atraumatic, no cyanosis or edema Pulses: 2+ and symmetric Skin: Skin color, texture, turgor normal. No rashes or lesions Neurologic: Grossly normal  EKG not performed today      ASSESSMENT AND PLAN:   Hyperlipidemia History of hyperlipidemia on statin therapy with lipid profile performed 02/08/2023 revealing a total cholesterol 148, LDL 67 HDL 69.  Essential hypertension History of  essential hypertension with blood pressure measured today at 140/90.  He is on amlodipine , hydrochlorothiazide , lisinopril  and metoprolol .  Carotid artery stenosis History of carotid artery disease status post right carotid endarterectomy performed at [facility] 2008 in [place] ..  His most recent carotid Dopplers performed 02/01/2022 showed a widely patent endarterectomy site.  Tobacco abuse Discontinue tobacco abuse and 2000.  Atrial fibrillation, chronic (HCC) History of A-fib demonstrated by Katlyn West NP 07/08/2023.  He had successful DC cardioversion by Dr. Emmette Harms 07/13/2023 which did not last long.  Unfortunately, he is intolerant to Eliquis  and Xarelto  breaking out with skin rash.  He did see Dr. Lawana Pray and Dr. Daneil Dunker who felt that he was a good candidate for A-fib ablation and Watchman device.  Referring him to the Coumadin clinic to try and transition from Xarelto  to Coumadin oral anticoagulation.     Avanell Leigh MD FACP,FACC,FAHA, [facility] 10/24/2023 10:45 AM

## 2023-10-24 NOTE — Patient Instructions (Signed)
 Medication Instructions:  Your physician recommends that you continue on your current medications as directed. Please refer to the Current Medication list given to you today.  *If you need a refill on your cardiac medications before your next appointment, please call your pharmacy*  Follow-Up: At [facility], you and your health needs are our priority.  As part of our continuing mission to provide you with exceptional heart care, our providers are all part of one team.  This team includes your primary Cardiologist (physician) and Advanced Practice Providers or APPs (Physician Assistants and Nurse Practitioners) who all work together to provide you with the care you need, when you need it.  Your next appointment:   6 month(s)  Provider:   Katlyn West, NP         Then, Lauro Portal, MD will plan to see you again in 12 month(s).    We recommend signing up for the patient portal called "MyChart".  Sign up information is provided on this After Visit Summary.  MyChart is used to connect with patients for Virtual Visits (Telemedicine).  Patients are able to view lab/test results, encounter notes, upcoming appointments, etc.  Non-urgent messages can be sent to your provider as well.   To learn more about what you can do with MyChart, go to [email or web address].

## 2023-10-24 NOTE — Telephone Encounter (Signed)
 Pt seen in the office today for follow up and to further discuss options for anti-coagulation therapy.

## 2023-10-24 NOTE — Assessment & Plan Note (Signed)
 History of essential hypertension with blood pressure measured today at 140/90.  He is on amlodipine , hydrochlorothiazide , lisinopril  and metoprolol .

## 2023-10-25 ENCOUNTER — Encounter

## 2023-10-27 ENCOUNTER — Encounter: Payer: Self-pay | Admitting: Nurse Practitioner

## 2023-10-27 ENCOUNTER — Ambulatory Visit (INDEPENDENT_AMBULATORY_CARE_PROVIDER_SITE_OTHER): Admitting: Nurse Practitioner

## 2023-10-27 ENCOUNTER — Other Ambulatory Visit

## 2023-10-27 VITALS — BP 135/84 | HR 75 | Ht 70.0 in | Wt 213.0 lb

## 2023-10-27 DIAGNOSIS — R21 Rash and other nonspecific skin eruption: Secondary | ICD-10-CM

## 2023-10-27 MED ORDER — PREDNISONE 10 MG PO TABS: 10.0000 mg | ORAL_TABLET | Freq: Every day | ORAL | 0 refills | Status: DC

## 2023-10-27 NOTE — Assessment & Plan Note (Signed)
 Secondary to anticoagulants.  Will be stopping Xerelto and switching to Coumadin.  Will treat with prednisone  60mg  x 2 days, 50mg  x 2 days, 40mg  x 2days, 30mg  x 2 days, and continue with 20mg  x 2 weeks.  Follow up on Monday.  Would benefit from referral to [facility]. Will discuss at visit.

## 2023-10-27 NOTE — Progress Notes (Signed)
 BP 135/84   Pulse 75   Ht 5' 10 (1.778 m)   Wt 213 lb (96.6 kg)   BMI 30.56 kg/m    Subjective:    Patient ID: Charles Bryant, male    DOB: 09-17-1955, 68 y.o.   MRN: [id number]  HPI: Charles Bryant is a 68 y.o. male  Chief Complaint  Patient presents with   Edema    Left arm swelling   Patient presents to clinic with complaints of tighten in his throat, difficulty swallowing, swelling in left arm, leg and a rash.  He has been on Eliquis , Pradaxa  and Xerelto but continues to have allergic reactions to medications.  He has been switched to coumadin and will establish care with the coumadin clinic next week.  He is leaving for a trip out of the country next week.     Relevant past medical, surgical, family and social history reviewed and updated as indicated. Interim medical history since our last visit reviewed. Allergies and medications reviewed and updated.  Review of Systems  Skin:        Swelling of left arm, leg and rash all over skin    Per HPI unless specifically indicated above     Objective:    BP 135/84   Pulse 75   Ht 5' 10 (1.778 m)   Wt 213 lb (96.6 kg)   BMI 30.56 kg/m   Wt Readings from Last 3 Encounters:  10/27/23 213 lb (96.6 kg)  10/24/23 211 lb 12.8 oz (96.1 kg)  10/13/23 205 lb (93 kg)    Physical Exam Vitals and nursing note reviewed.  Constitutional:      General: He is not in acute distress.    Appearance: Normal appearance. He is not ill-appearing, toxic-appearing or diaphoretic.  HENT:     Head: Normocephalic.     Right Ear: External ear normal.     Left Ear: External ear normal.     Nose: Nose normal. No congestion or rhinorrhea.     Mouth/Throat:     Mouth: Mucous membranes are moist.   Eyes:     General:        Right eye: No discharge.        Left eye: No discharge.     Extraocular Movements: Extraocular movements intact.     Conjunctiva/sclera: Conjunctivae normal.     Pupils: Pupils are equal, round, and reactive to light.     Cardiovascular:     Rate and Rhythm: Normal rate and regular rhythm.     Heart sounds: No murmur heard. Pulmonary:     Effort: Pulmonary effort is normal. No respiratory distress.     Breath sounds: Normal breath sounds. No wheezing, rhonchi or rales.  Abdominal:     General: Abdomen is flat. Bowel sounds are normal.   Musculoskeletal:     Cervical back: Normal range of motion and neck supple.   Skin:    General: Skin is warm and dry.     Capillary Refill: Capillary refill takes less than 2 seconds.     Findings: Bruising, erythema and rash present.   Neurological:     General: No focal deficit present.     Mental Status: He is alert and oriented to person, place, and time.   Psychiatric:        Mood and Affect: Mood normal.        Behavior: Behavior normal.        Thought Content: Thought content normal.  Judgment: Judgment normal.     Results for orders placed or performed in visit on 08/24/23  Surgical pathology   Collection Time: 08/24/23 10:49 AM  Result Value Ref Range   SURGICAL PATHOLOGY      SURGICAL PATHOLOGY CASE: [id number] PATIENT: Charles Bryant Surgical Pathology Report     Clinical History: history of presumed drug reaction 3 weeks ago, recurs and worsens when off steroids (cm)     FINAL MICROSCOPIC DIAGNOSIS:  A. 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  Within the dermis there is a dense superficial and mid perivascular chronic inflammatory cell infiltrate with rare scattered eosinophils.  The surface hyperparakeratosis suggest prior spongiosis.  The differential diagnosis would include a drug reaction or possible contact hypersensitivity reaction/dermatitis. Clinical correlation is recommended.   GROSS DESCRIPTION:  Received fresh is a 0.5 x  0.4 x 0.3 cm fragment of tan, wrinkled skin, without a grossly distinct lesion.  The specimen is entirely submit ted in 1 block (bisected). (KW, 08/25/2023)  Final Diagnosis performed by Judithann Novas, MD.   Electronically signed 08/26/2023 Technical and / or Professional components performed at [facility]. [facility], [address], [place], [place] [address].  Immunohistochemistry Technical component (if applicable) was performed at [facility]. [address], [address], [place], [place] [address].   IMMUNOHISTOCHEMISTRY DISCLAIMER (if applicable): Some of these immunohistochemical stains may have been developed and the performance characteristics determine by [facility]. Some may not have been cleared or approved by the U.S. Food and Drug Administration. The FDA has determined that such clearance or approval is not necessary. This test is used for clinical purposes. It should not be regarded as investigational or for research. This laboratory is certified under the Clinical Laboratory Improvement Amendments of 1988 (CLIA-88) as qualifi ed to perform high complexity clinical laboratory testing.  The controls stained appropriately.   IHC stains are performed on formalin fixed, paraffin embedded tissue using a 3,3diaminobenzidine (DAB) chromogen and Leica Bond Autostainer System. The staining intensity of the nucleus is score manually and is reported as the percentage of tumor cell nuclei demonstrating specific nuclear staining. The specimens are fixed in 10% Neutral Formalin for at least 6 hours and up to 72hrs. These tests are validated on decalcified tissue. Results should be interpreted with caution given the possibility of false negative results on decalcified specimens. Antibody Clones are as follows ER-clone [id number], PR-clone 16, Ki67- clone MM1. Some of these immunohistochemical stains may have been developed and the  performance characteristics determined by [facility].       Assessment & Plan:   Problem List Items Addressed This Visit       Musculoskeletal and Integument   Rash - Primary   Secondary to anticoagulants.  Will be stopping Xerelto and switching to Coumadin.  Will treat with prednisone  60mg  x 2 days, 50mg  x 2 days, 40mg  x 2days, 30mg  x 2 days, and continue with 20mg  x 2 weeks.  Follow up on Monday.  Would benefit from referral to [facility]. Will discuss at visit.        Follow up plan: Return for Monday at 1120.

## 2023-10-31 ENCOUNTER — Encounter: Payer: Self-pay | Admitting: Nurse Practitioner

## 2023-10-31 ENCOUNTER — Ambulatory Visit (INDEPENDENT_AMBULATORY_CARE_PROVIDER_SITE_OTHER): Admitting: Nurse Practitioner

## 2023-10-31 VITALS — BP 112/74 | HR 83 | Ht 70.0 in | Wt 213.0 lb

## 2023-10-31 DIAGNOSIS — R21 Rash and other nonspecific skin eruption: Secondary | ICD-10-CM

## 2023-10-31 NOTE — Progress Notes (Signed)
 BP 112/74   Pulse 83   Ht 5' 10 (1.778 m)   Wt 213 lb (96.6 kg)   BMI 30.56 kg/m    Subjective:    Patient ID: Charles Bryant, male    DOB: 1956-03-16, 68 y.o.   MRN: [id number]  HPI: Charles Bryant is a 68 y.o. male  Chief Complaint  Patient presents with   Rash    Follow up   Patient presents to clinic to follow up on rash secondary to Xerelto.  He has tried and failed Eliquis , Pradaxa  and Xerelto but continues to have allergic reactions to medications.  He has been switched to coumadin and will establish care with the coumadin on Wednesday.  He has now stopped the Xerelto.  Symptoms have significantly improved since last week.  The swelling in his joints is gone, no more difficulty swallowing and hives have significantly decreased.   He is leaving for a trip out of the country at the end of the week.     Relevant past medical, surgical, family and social history reviewed and updated as indicated. Interim medical history since our last visit reviewed. Allergies and medications reviewed and updated.  Review of Systems  Skin:  Positive for rash.    Per HPI unless specifically indicated above     Objective:    BP 112/74   Pulse 83   Ht 5' 10 (1.778 m)   Wt 213 lb (96.6 kg)   BMI 30.56 kg/m   Wt Readings from Last 3 Encounters:  10/31/23 213 lb (96.6 kg)  10/27/23 213 lb (96.6 kg)  10/24/23 211 lb 12.8 oz (96.1 kg)    Physical Exam Vitals and nursing note reviewed.  Constitutional:      General: He is not in acute distress.    Appearance: Normal appearance. He is not ill-appearing, toxic-appearing or diaphoretic.  HENT:     Head: Normocephalic.     Right Ear: External ear normal.     Left Ear: External ear normal.     Nose: Nose normal. No congestion or rhinorrhea.     Mouth/Throat:     Mouth: Mucous membranes are moist.   Eyes:     General:        Right eye: No discharge.        Left eye: No discharge.     Extraocular Movements: Extraocular movements intact.      Conjunctiva/sclera: Conjunctivae normal.     Pupils: Pupils are equal, round, and reactive to light.    Cardiovascular:     Rate and Rhythm: Normal rate and regular rhythm.     Heart sounds: No murmur heard. Pulmonary:     Effort: Pulmonary effort is normal. No respiratory distress.     Breath sounds: Normal breath sounds. No wheezing, rhonchi or rales.  Abdominal:     General: Abdomen is flat. Bowel sounds are normal.   Musculoskeletal:     Cervical back: Normal range of motion and neck supple.   Skin:    General: Skin is warm and dry.     Capillary Refill: Capillary refill takes less than 2 seconds.     Findings: Rash present.   Neurological:     General: No focal deficit present.     Mental Status: He is alert and oriented to person, place, and time.   Psychiatric:        Mood and Affect: Mood normal.        Behavior: Behavior normal.  Thought Content: Thought content normal.        Judgment: Judgment normal.     Results for orders placed or performed in visit on 08/24/23  Surgical pathology   Collection Time: 08/24/23 10:49 AM  Result Value Ref Range   SURGICAL PATHOLOGY      SURGICAL PATHOLOGY CASE: [id number] PATIENT: Eli Vandunk Surgical Pathology Report     Clinical History: history of presumed drug reaction 3 weeks ago, recurs and worsens when off steroids (cm)     FINAL MICROSCOPIC DIAGNOSIS:  A. 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  Within the dermis there is a dense superficial and mid perivascular chronic inflammatory cell infiltrate with rare scattered eosinophils.  The surface hyperparakeratosis suggest prior spongiosis.  The differential diagnosis would include a drug reaction or possible contact hypersensitivity  reaction/dermatitis. Clinical correlation is recommended.   GROSS DESCRIPTION:  Received fresh is a 0.5 x 0.4 x 0.3 cm fragment of tan, wrinkled skin, without a grossly distinct lesion.  The specimen is entirely submit ted in 1 block (bisected). (KW, 08/25/2023)  Final Diagnosis performed by Prentice Pitcher, MD.   Electronically signed 08/26/2023 Technical and / or Professional components performed at [facility]. [facility], [address] [place], [place] [address].  Immunohistochemistry Technical component (if applicable) was performed at [facility]. [address], [address], [place], [place] [address].   IMMUNOHISTOCHEMISTRY DISCLAIMER (if applicable): Some of these immunohistochemical stains may have been developed and the performance characteristics determine by [facility]. Some may not have been cleared or approved by the U.S. Food and Drug Administration. The FDA has determined that such clearance or approval is not necessary. This test is used for clinical purposes. It should not be regarded as investigational or for research. This laboratory is certified under the Clinical Laboratory Improvement Amendments of 1988 (CLIA-88) as qualifi ed to perform high complexity clinical laboratory testing.  The controls stained appropriately.   IHC stains are performed on formalin fixed, paraffin embedded tissue using a 3,3diaminobenzidine (DAB) chromogen and Leica Bond Autostainer System. The staining intensity of the nucleus is score manually and is reported as the percentage of tumor cell nuclei demonstrating specific nuclear staining. The specimens are fixed in 10% Neutral Formalin for at least 6 hours and up to 72hrs. These tests are validated on decalcified tissue. Results should be interpreted with caution given the possibility of false negative results on decalcified specimens. Antibody Clones are as follows ER-clone [id number], PR-clone 16,  Ki67- clone MM1. Some of these immunohistochemical stains may have been developed and the performance characteristics determined by [facility].       Assessment & Plan:   Problem List Items Addressed This Visit       Musculoskeletal and Integument   Rash - Primary   Improved from last week.  Continue with Coumadin.  Continue with prednisone  taper.  Follow up in 2 weeks once he has returned from his trip.           Follow up plan: Return for Follow up skin on Wednesday July 9- 11am.

## 2023-10-31 NOTE — Assessment & Plan Note (Signed)
 Improved from last week.  Continue with Coumadin.  Continue with prednisone  taper.  Follow up in 2 weeks once he has returned from his trip.

## 2023-11-01 ENCOUNTER — Other Ambulatory Visit: Payer: Self-pay

## 2023-11-01 DIAGNOSIS — I482 Chronic atrial fibrillation, unspecified: Secondary | ICD-10-CM

## 2023-11-02 ENCOUNTER — Ambulatory Visit: Attending: Internal Medicine

## 2023-11-02 DIAGNOSIS — I4891 Unspecified atrial fibrillation: Secondary | ICD-10-CM | POA: Insufficient documentation

## 2023-11-02 DIAGNOSIS — Z7901 Long term (current) use of anticoagulants: Secondary | ICD-10-CM | POA: Diagnosis not present

## 2023-11-02 LAB — POCT INR: INR: 3.7 — AB (ref 2.0–3.0)

## 2023-11-02 NOTE — Patient Instructions (Signed)
 Hold today only then Decrease to 1 tablet Daily except 0.5 tablet every Wednesday.  INR in 2 weeks.  [phone]  A full discussion of the nature of anticoagulants has been carried out.  A benefit risk analysis has been presented to the patient, so that they understand the justification for choosing anticoagulation at this time. The need for frequent and regular monitoring, precise dosage adjustment and compliance is stressed.  Side effects of potential bleeding are discussed.  The patient should avoid any OTC items containing aspirin or ibuprofen, and should avoid great swings in general diet.  Avoid alcohol consumption.  Call if any signs of abnormal bleeding.

## 2023-11-03 ENCOUNTER — Ambulatory Visit: Admitting: Nurse Practitioner

## 2023-11-15 ENCOUNTER — Telehealth: Payer: Self-pay | Admitting: *Deleted

## 2023-11-15 NOTE — Telephone Encounter (Signed)
-----   Message from [facility] April G sent at 11/15/2023  2:25 PM EDT ----- Regarding: Afib Ablation 8/8 Pt is scheduled for Afib Ablation with Dr. Kennyth on 8/8... He was recently switched from Pradaxa  to Warfarin and will need 3 therapeutic weekly INR's   Thanks, April

## 2023-11-15 NOTE — Telephone Encounter (Signed)
 Placed a note on Anticoagulation appointment for tomorrow regarding pending ablation on 12/16/23 and needing weekly INR's.

## 2023-11-16 ENCOUNTER — Telehealth: Payer: Self-pay | Admitting: Cardiovascular Disease

## 2023-11-16 ENCOUNTER — Ambulatory Visit: Attending: Cardiovascular Disease

## 2023-11-16 ENCOUNTER — Other Ambulatory Visit
Admission: RE | Admit: 2023-11-16 | Discharge: 2023-11-16 | Disposition: A | Discharge status: Home / Self Care | Attending: Cardiovascular Disease | Admitting: Cardiovascular Disease

## 2023-11-16 DIAGNOSIS — Z7901 Long term (current) use of anticoagulants: Secondary | ICD-10-CM

## 2023-11-16 DIAGNOSIS — I482 Chronic atrial fibrillation, unspecified: Secondary | ICD-10-CM | POA: Insufficient documentation

## 2023-11-16 DIAGNOSIS — I4891 Unspecified atrial fibrillation: Secondary | ICD-10-CM

## 2023-11-16 LAB — PROTIME-INR
INR: >10 (ref 0.8–1.2)
Prothrombin Time: 89.7 s — ABNORMAL HIGH (ref 11.4–15.2)

## 2023-11-16 LAB — POCT INR: INR: 8 — AB (ref 2.0–3.0)

## 2023-11-16 NOTE — Telephone Encounter (Signed)
Critical Lab results.

## 2023-11-16 NOTE — Telephone Encounter (Signed)
 STAT phone call received  CRITICAL LAB: PT is 89.7, INR > 10, [employer], [place], Charity fundraiser in [place] lab notified

## 2023-11-16 NOTE — Patient Instructions (Addendum)
 Description   Pt sent for STAT INR at hospital.  Pt has not taken today's dosage of Warfarin, will hold Warfarin until lab results received and we have called pt with dosing instructions.  Advised pt to go to ED with any bleeding issues. STAT lab >10.0 called Chris Pavero, pharmacist to discuss dosing recommendations and recheck date.  Pt is not currently having any bleeding issues. Pt is pending ablation.  Called pt 4:55pm advised to hold Warfarin until STAT lab recheck here in [facility] lab. Pt will come in Monday 11/21/23 in the am for labwork.  Order put into computer.  Pt advised to increase green leafy vegetable intake over next few days.  Go to the ED with any signs of bleeding. Pt verbalized understanding.  Coumadin  Clinic [phone]

## 2023-11-17 ENCOUNTER — Ambulatory Visit (HOSPITAL_COMMUNITY): Admit: 2023-11-17 | Admitting: Cardiology

## 2023-11-17 ENCOUNTER — Encounter (HOSPITAL_COMMUNITY): Payer: Self-pay

## 2023-11-17 ENCOUNTER — Other Ambulatory Visit: Payer: Self-pay | Admitting: Nurse Practitioner

## 2023-11-17 LAB — BASIC METABOLIC PANEL WITH GFR
BUN/Creatinine Ratio: 12 (ref 10–24)
BUN: 10 mg/dL (ref 8–27)
CO2: 25 mmol/L (ref 20–29)
Calcium: 9 mg/dL (ref 8.6–10.2)
Chloride: 95 mmol/L — ABNORMAL LOW (ref 96–106)
Creatinine, Ser: 0.81 mg/dL (ref 0.76–1.27)
Glucose: 110 mg/dL — ABNORMAL HIGH (ref 70–99)
Potassium: 3.6 mmol/L (ref 3.5–5.2)
Sodium: 137 mmol/L (ref 134–144)
eGFR: 95 mL/min/1.73 (ref >59)

## 2023-11-17 LAB — CBC
Hematocrit: 41 % (ref 37.5–51.0)
Hemoglobin: 13.8 g/dL (ref 13.0–17.7)
MCH: 31.3 pg (ref 26.6–33.0)
MCHC: 33.7 g/dL (ref 31.5–35.7)
MCV: 93 fL (ref 79–97)
Platelets: 164 x10E3/uL (ref 150–450)
RBC: 4.41 x10E6/uL (ref 4.14–5.80)
RDW: 13.4 % (ref 11.6–15.4)
WBC: 9.4 x10E3/uL (ref 3.4–10.8)

## 2023-11-17 SURGERY — ATRIAL FIBRILLATION ABLATION
Anesthesia: General

## 2023-11-18 NOTE — Telephone Encounter (Signed)
 Disregard   No note needed   Rx refilled

## 2023-11-21 ENCOUNTER — Other Ambulatory Visit: Payer: Self-pay

## 2023-11-21 ENCOUNTER — Ambulatory Visit (INDEPENDENT_AMBULATORY_CARE_PROVIDER_SITE_OTHER)

## 2023-11-21 ENCOUNTER — Other Ambulatory Visit: Payer: Self-pay | Admitting: Cardiovascular Disease

## 2023-11-21 ENCOUNTER — Ambulatory Visit: Payer: Self-pay | Admitting: Cardiovascular Disease

## 2023-11-21 DIAGNOSIS — I482 Chronic atrial fibrillation, unspecified: Secondary | ICD-10-CM

## 2023-11-21 DIAGNOSIS — Z7901 Long term (current) use of anticoagulants: Secondary | ICD-10-CM

## 2023-11-21 DIAGNOSIS — I4891 Unspecified atrial fibrillation: Secondary | ICD-10-CM

## 2023-11-21 LAB — PROTIME-INR
INR: 1.8 — ABNORMAL HIGH (ref 0.9–1.2)
Prothrombin Time: 19.3 s — ABNORMAL HIGH (ref 9.1–12.0)

## 2023-11-21 NOTE — Progress Notes (Signed)
Please see anticoagulation encounter.

## 2023-11-21 NOTE — Patient Instructions (Signed)
 Description   Spoke with pt and pt's wife over the phone.  PENDING AFIB ABLATION; Take 1.5 tablets today only then  START taking 1 tablet daily EXCEPT 1/2 tablet on Mondays, Wednesdays and Fridays.  Recheck INR in 1 week Remain consistent with greens each week.  Coumadin  Clinic [phone]

## 2023-11-23 ENCOUNTER — Ambulatory Visit: Payer: Self-pay

## 2023-11-23 LAB — PROTIME-INR

## 2023-11-25 ENCOUNTER — Ambulatory Visit (HOSPITAL_COMMUNITY)
Admission: RE | Admit: 2023-11-25 | Discharge: 2023-11-25 | Disposition: A | Discharge status: Home / Self Care | Source: Ambulatory Visit | Attending: Cardiology | Admitting: Cardiology

## 2023-11-25 DIAGNOSIS — I77819 Aortic ectasia, unspecified site: Secondary | ICD-10-CM | POA: Diagnosis not present

## 2023-11-25 DIAGNOSIS — I4819 Other persistent atrial fibrillation: Secondary | ICD-10-CM | POA: Insufficient documentation

## 2023-11-25 MED ORDER — IOHEXOL 350 MG/ML SOLN
100.0000 mL | Freq: Once | INTRAVENOUS | Status: AC | PRN
  Administered 2023-11-25: 100 mL via INTRAVENOUS

## 2023-11-30 ENCOUNTER — Ambulatory Visit: Attending: Cardiovascular Disease

## 2023-11-30 DIAGNOSIS — Z7901 Long term (current) use of anticoagulants: Secondary | ICD-10-CM

## 2023-11-30 DIAGNOSIS — I482 Chronic atrial fibrillation, unspecified: Secondary | ICD-10-CM

## 2023-11-30 LAB — POCT INR: INR: 4.8 — AB (ref 2.0–3.0)

## 2023-11-30 NOTE — Patient Instructions (Signed)
 Hold today, Thursday and Friday then continue taking 1 tablet daily EXCEPT 1/2 tablet on Mondays, Wednesdays and Fridays. Going on Cruise 7/24-8/5 Recheck INR in 2 weeks  Ablation 8/8 Remain consistent with greens each week.  Coumadin  Clinic [phone]

## 2023-12-05 ENCOUNTER — Other Ambulatory Visit: Payer: Self-pay

## 2023-12-05 ENCOUNTER — Telehealth: Payer: Self-pay

## 2023-12-05 NOTE — Telephone Encounter (Signed)
 Kwinton Scovell: Chicken wing anatomy Max 28/ AVG 24/ Depth 17.2 Likely use a 31mm device Inf/Mid TSP RAO 10 CAU 11

## 2023-12-06 MED ORDER — FLUTICASONE-SALMETEROL 500-50 MCG/ACT IN AEPB: INHALATION_SPRAY | RESPIRATORY_TRACT | 3 refills | Status: DC

## 2023-12-07 ENCOUNTER — Other Ambulatory Visit: Payer: Self-pay | Admitting: Nurse Practitioner

## 2023-12-08 ENCOUNTER — Telehealth (HOSPITAL_COMMUNITY): Payer: Self-pay

## 2023-12-08 NOTE — Telephone Encounter (Signed)
 Attempted to reach patient to discuss upcoming procedure, no answer. Left VM for patient to return call.

## 2023-12-08 NOTE — Telephone Encounter (Signed)
 Rx 07/14/23 #90 1RF- too soon Requested Prescriptions  Pending Prescriptions Disp Refills   amLODipine  (NORVASC ) 10 MG tablet [Pharmacy Med Name: amLODIPine  Besylate 10 MG Oral Tablet] 80 tablet 3    Sig: TAKE 1 TABLET BY MOUTH DAILY     Cardiovascular: Calcium  Channel Blockers 2 Passed - 12/08/2023  2:49 PM      Passed - Last BP in normal range    BP Readings from Last 1 Encounters:  11/25/23 120/78         Passed - Last Heart Rate in normal range    Pulse Readings from Last 1 Encounters:  11/25/23 90         Passed - Valid encounter within last 6 months    Recent Outpatient Visits           1 month ago Rash   [place] [facility] Melvin Pao, NP   1 month ago Rash   [place] [facility] Melvin Pao, NP   3 months ago Hives   [place] [facility] Melvin Pao, NP   3 months ago Rash   [place] [facility] Herold Hadassah SQUIBB, MD   3 months ago Hives   [place] [facility] Melvin Pao, NP

## 2023-12-08 NOTE — Telephone Encounter (Signed)
 Dr. Kennyth made aware of INR levels and requested to loop in the coumadin  clinic for assistance. He advised to let patient know that if above 3.5 then we will have to reschedule.  Advised Dr. Kennyth, I was not able to reach patient and seen a note stating he is on a cruise until 8/5. Coumadin  clinic has him holding med 3 days then continue taking 1 tablet daily EXCEPT 1/2 tablet on Mondays, Wednesdays and Fridays and to remain consistent with greens. Made Lavanda Search, RN and [name] Kanner, RN aware.

## 2023-12-14 ENCOUNTER — Encounter: Payer: Self-pay | Admitting: Cardiovascular Disease

## 2023-12-14 ENCOUNTER — Telehealth: Payer: Self-pay | Admitting: Cardiovascular Disease

## 2023-12-14 ENCOUNTER — Telehealth: Payer: Self-pay | Admitting: Cardiology

## 2023-12-14 ENCOUNTER — Ambulatory Visit

## 2023-12-14 ENCOUNTER — Other Ambulatory Visit: Payer: Self-pay

## 2023-12-14 MED ORDER — OMEPRAZOLE 40 MG PO CPDR: 40.0000 mg | DELAYED_RELEASE_CAPSULE | Freq: Every day | ORAL | 1 refills | Status: DC

## 2023-12-14 NOTE — Telephone Encounter (Signed)
 I spoke to patient's wife who informed me that the patient has the same hive reaction with the Warfarin that he experienced with Eliquis  and Xarelto .  They will f/u with Dr Court and have an appointment with PCP 8/7.  I told her that we will continue follow up.  He had been off Warfarin for a few days due to elevated INR and hives resolved.  Once restarted, hives returned.

## 2023-12-14 NOTE — Telephone Encounter (Signed)
 Wife called to r/s the patient ablation. Please advise

## 2023-12-14 NOTE — Telephone Encounter (Signed)
 See telephone encounter from 8/6.  Charles Bryant   12/14/23  2:30 PM Note   Wife spoke to [name] in Coumadin  clinic regarding husband having a reaction to the Coumadin  (see previous phone note). [name] advised that they speak with Dr Ranee nurse. Please call     Josie RN

## 2023-12-14 NOTE — Telephone Encounter (Signed)
 Spoke to patient's wife.She stated husband is having a bad rash and hives on ankles and forearms due to taking Coumadin .Stated he already saw Dr.Parker.Afib ablation was scheduled,but got rescheduled to 10/1 due to Dr.Parker being sick.Stated she wanted to ask Dr.Berry if he would prescribe a steroid or change Coumadin  to Lovenox .I will send message to [facility] for advice.

## 2023-12-14 NOTE — Telephone Encounter (Signed)
 Spoke with wife per DPR. Wife reports that patient has a upper respiratory infection. Wife reports that patient has congestion, intermittent cough and fatigue. Reports low grade temp of 100 yesterday but none today. Patient had to miss coumadin  clinic today due to not feeling well. Wife reports patient has an appointment with PCP tomorrow for the respiratory infection and could have INR checked at that time if needed. Wife would like to know if the patient should still have the ablation as that is scheduled for 12/16/23.

## 2023-12-14 NOTE — Telephone Encounter (Signed)
 Spoke with the patient's wife and advised that patient will need to reschedule his ablation due to respiratory infection. He has been moved to 10/1.

## 2023-12-14 NOTE — Telephone Encounter (Signed)
  Wife spoke to Charles Bryant in Coumadin  clinic regarding husband having a reaction to the Coumadin  (see previous phone note). Charles Bryant advised that they speak with Dr Ranee nurse. Please call

## 2023-12-14 NOTE — Telephone Encounter (Signed)
 Wife would like to speak to Charles Bryant because pt had to r/s appt today because he is sick

## 2023-12-15 ENCOUNTER — Telehealth: Payer: Self-pay

## 2023-12-15 ENCOUNTER — Encounter: Payer: Self-pay | Admitting: Nurse Practitioner

## 2023-12-15 ENCOUNTER — Ambulatory Visit: Admitting: Nurse Practitioner

## 2023-12-15 VITALS — BP 137/84 | HR 123 | Temp 97.7°F | Ht 70.0 in | Wt 210.8 lb

## 2023-12-15 DIAGNOSIS — I482 Chronic atrial fibrillation, unspecified: Secondary | ICD-10-CM | POA: Diagnosis not present

## 2023-12-15 DIAGNOSIS — Z7901 Long term (current) use of anticoagulants: Secondary | ICD-10-CM

## 2023-12-15 DIAGNOSIS — D6869 Other thrombophilia: Secondary | ICD-10-CM | POA: Diagnosis not present

## 2023-12-15 DIAGNOSIS — E782 Mixed hyperlipidemia: Secondary | ICD-10-CM

## 2023-12-15 DIAGNOSIS — T7840XA Allergy, unspecified, initial encounter: Secondary | ICD-10-CM

## 2023-12-15 DIAGNOSIS — I4819 Other persistent atrial fibrillation: Secondary | ICD-10-CM

## 2023-12-15 LAB — COAGUCHEK XS/INR WAIVED
INR: 3.6 — ABNORMAL HIGH (ref 0.9–1.1)
Prothrombin Time: 43.7 s

## 2023-12-15 MED ORDER — WARFARIN SODIUM 5 MG PO TABS: 2.5000 mg | ORAL_TABLET | ORAL | Status: DC

## 2023-12-15 MED ORDER — IPRATROPIUM-ALBUTEROL 0.5-2.5 (3) MG/3ML IN SOLN
3.0000 mL | Freq: Four times a day (QID) | RESPIRATORY_TRACT | 1 refills | Status: AC | PRN
Start: 2023-12-15 — End: ?

## 2023-12-15 MED ORDER — PREDNISONE 10 MG PO TABS: ORAL_TABLET | ORAL | 0 refills | Status: DC

## 2023-12-15 MED ORDER — PREDNISONE 10 MG PO TABS: 10.0000 mg | ORAL_TABLET | Freq: Every day | ORAL | 0 refills | Status: DC

## 2023-12-15 MED ORDER — AMOXICILLIN-POT CLAVULANATE 875-125 MG PO TABS: 1.0000 | ORAL_TABLET | Freq: Two times a day (BID) | ORAL | 0 refills | Status: DC

## 2023-12-15 NOTE — Telephone Encounter (Signed)
-----   Message from [facility] April G sent at 12/15/2023  1:28 PM EDT ----- Regarding: Ablation/INR Just making sure you all are aware of pt's Afib Ablation getting rescheduled out to 10/1...  Thanks, April

## 2023-12-15 NOTE — Progress Notes (Signed)
   12/15/2023  Patient ID: Charles Bryant Manner, male   DOB: 10-Feb-1956, 68 y.o.   MRN: [id number]  Clinic routed request from PCP to assist with Lovenox  dosing.  Patient is diagnosed with a-fib and cannot tolerate DOACs or warfarin due to skin irritation/hives, so provider would like to switch from warfarin to Lovenox .  Based on eGFR of 95 and weight of 96kg, I recommend switching warfarin to Lovenox  150mg /kg daily.  I do recommend an INR before switching to determine if therapy should be witheld for a day or two.  If INR 5mg , stop warfarin and start Lovenox .  If INR 2.5-4, stop warfarin and skip 1 day before starting Lovenox  150mg  daily.  If INR >4, consult PharmD.  Channing DELENA Mealing, PharmD, DPLA

## 2023-12-15 NOTE — Assessment & Plan Note (Signed)
 Chronic.  Currently on Coumadin  and having allergic reaction.  INR in office was 3.6 today. Changed Coumadin  dosing to 5mg  M, W, F, and 2.5mg  Earlean Everts, Sat, Sunday.  Reached out to Dr. Court and RN to discuss changing from Coumadin  to Lovenox .  At time of visit, no response.  Will reach out to our Pharmacist to help with dosing.

## 2023-12-15 NOTE — Telephone Encounter (Signed)
 This encounter was created in error - please disregard.

## 2023-12-15 NOTE — Telephone Encounter (Signed)
 Ablation rescheduled to 02/08/24. Note placed on upcoming coumadin  clinic appt on 12/21/23 to make coumadin  clinic nurse aware.

## 2023-12-15 NOTE — Telephone Encounter (Signed)
 Multiple encounters opened. Please see mychart message for more information.

## 2023-12-15 NOTE — Progress Notes (Signed)
 BP 137/84   Pulse (!) 123   Temp 97.7 F (36.5 C) (Oral)   Ht 5' 10 (1.778 m)   Wt 210 lb 12.8 oz (95.6 kg)   SpO2 96%   BMI 30.25 kg/m    Subjective:    Patient ID: Charles Bryant, male    DOB: 11-Dec-1955, 68 y.o.   MRN: [id number]  HPI: Charles Bryant is a 68 y.o. male  Chief Complaint  Patient presents with   Diabetes   Hyperlipidemia   Hypertension   URI   INR CHECK   Rash   GERD  He is taking Omeprazole  40 MG daily  GERD control status: controlled Satisfied with current treatment? yes Heartburn frequency: None Medication side effects: no  Medication compliance: better Dysphagia: no Odynophagia:  no Hematemesis: no Blood in stool: no EGD: Cologuard negative 2022   HYPERTENSION / HYPERLIPIDEMIA Taking Amlodipine  10 MG, Lisinopril  20 MG, Hydrochlorathiazide 12.5MG  and metoprolol  50MG . He is also taking Atorvastatin  40MG .   Satisfied with current treatment? Yes Duration of hypertension: years BP monitoring frequency:no BP range: Not checking BP medication side effects: no Past BP meds:  amlodipine , quinapril , HCTZ, Metoprolol  Duration of hyperlipidemia: years Cholesterol medication side effects: no Cholesterol supplements: none Past cholesterol medications: atorvastain (lipitor) Medication compliance: excellent compliance Aspirin: yes Recent stressors: no Recurrent headaches: no Visual changes: no Palpitations: no Dyspnea: no Chest pain: no Lower extremity edema: no Dizzy/lightheaded: no  Diet: Admits to daily intake of fruits, vegetables, protein, and carbohydrates, states it could be better.  Water: 18 ounces daily  Physical activity: Not as active as he used to be   COPD/ASTHMA Using Fluticasone -salmeterol inhaler BID and has Albuterol  rescue as needed COPD status: controlled, using albuterol   Satisfied with current treatment?: yes Oxygen use: no Dyspnea frequency: No Cough frequency: No Rescue inhaler frequency: rarely, seasonal Limitation  of activity: no Productive cough: No Pneumovax: Up to Date Influenza: Up to Date  Patient states he was recently on a cruise and caught an upper respiratory virus.  Oxygen has been dropping to 88%.  Wife has been giving duo neb which does bring his Oxygen back up. Symptoms have been ongoing since Sunday.  Had to cancel ablation due to upper respiratory.   Patient has been off prednisone  since July 8 due to hives from DOAC.  He was switched to coumadin .  Hives came back on 7/14 then came off coumadin  for 5 days due to elevated INR. Hives resolved.  Now he is back on the Coumadin  and symptoms have worsened again.  Itching, hives, redness all over body.   Relevant past medical, surgical, family and social history reviewed and updated as indicated. Interim medical history since our last visit reviewed. Allergies and medications reviewed and updated.  Review of Systems  HENT:  Positive for congestion.   Eyes:  Negative for visual disturbance.  Respiratory:  Positive for cough and shortness of breath.   Cardiovascular:  Negative for chest pain and leg swelling.  Skin:  Positive for rash.  Neurological:  Negative for light-headedness and headaches.    Per HPI unless specifically indicated above     Objective:    BP 137/84   Pulse (!) 123   Temp 97.7 F (36.5 C) (Oral)   Ht 5' 10 (1.778 m)   Wt 210 lb 12.8 oz (95.6 kg)   SpO2 96%   BMI 30.25 kg/m   Wt Readings from Last 3 Encounters:  12/15/23 210 lb 12.8 oz (95.6 kg)  10/31/23 213 lb (96.6 kg)  10/27/23 213 lb (96.6 kg)    Physical Exam Vitals and nursing note reviewed.  Constitutional:      General: He is not in acute distress.    Appearance: Normal appearance. He is not ill-appearing, toxic-appearing or diaphoretic.  HENT:     Head: Normocephalic.     Right Ear: External ear normal.     Left Ear: External ear normal.     Nose: Congestion and rhinorrhea present.     Mouth/Throat:     Mouth: Mucous membranes are moist.      Pharynx: Posterior oropharyngeal erythema present. No oropharyngeal exudate.  Eyes:     General:        Right eye: No discharge.        Left eye: No discharge.     Extraocular Movements: Extraocular movements intact.     Conjunctiva/sclera: Conjunctivae normal.     Pupils: Pupils are equal, round, and reactive to light.  Cardiovascular:     Rate and Rhythm: Normal rate and regular rhythm.     Heart sounds: No murmur heard. Pulmonary:     Effort: Pulmonary effort is normal. No respiratory distress.     Breath sounds: Normal breath sounds. No wheezing, rhonchi or rales.  Abdominal:     General: Abdomen is flat. Bowel sounds are normal.  Musculoskeletal:     Cervical back: Normal range of motion and neck supple.  Skin:    General: Skin is warm and dry.     Capillary Refill: Capillary refill takes less than 2 seconds.     Findings: Rash present.     Comments: Red, raised itching areas all over body  Neurological:     General: No focal deficit present.     Mental Status: He is alert and oriented to person, place, and time.  Psychiatric:        Mood and Affect: Mood normal.        Behavior: Behavior normal.        Thought Content: Thought content normal.        Judgment: Judgment normal.     Results for orders placed or performed in visit on 11/30/23  POCT INR   Collection Time: 11/30/23  9:23 AM  Result Value Ref Range   INR 4.8 (A) 2.0 - 3.0   POC INR        Assessment & Plan:   Problem List Items Addressed This Visit       Cardiovascular and Mediastinum   A-fib (HCC) - Primary   Relevant Medications   warfarin (COUMADIN ) 5 MG tablet   Other Relevant Orders   Comp Met (CMET)   CoaguChek XS/INR Waived (STAT)   Hypercoagulable state due to persistent atrial fibrillation (HCC)   Chronic.  Currently on Coumadin  and having allergic reaction.  INR in office was 3.6 today. Changed Coumadin  dosing to 5mg  M, W, F, and 2.5mg  Tues, Thurs, Sat, Sunday.  Reached out to Dr.  Court and RN to discuss changing from Coumadin  to Lovenox .  At time of visit, no response.  Will reach out to our Pharmacist to help with dosing.        Relevant Medications   warfarin (COUMADIN ) 5 MG tablet   Atrial fibrillation, chronic (HCC)   Chronic. Not well controlled.  HR 123 in office but has been using albuterol .  Continue with Metoprolol  and anticoagulation.       Relevant Medications   warfarin (COUMADIN ) 5 MG tablet  Other   Hyperlipidemia   Relevant Medications   warfarin (COUMADIN ) 5 MG tablet   Other Relevant Orders   Lipid Profile   Long term (current) use of anticoagulants   Chronic.  Currently on Coumadin  and having allergic reaction.  INR in office was 3.6 today. Changed Coumadin  dosing to 5mg  M, W, F, and 2.5mg  Tues, Thurs, Sat, Sunday.  Reached out to Dr. Court and RN to discuss changing from Coumadin  to Lovenox .  At time of visit, no response.  Will reach out to our Pharmacist to help with dosing.        Relevant Orders   CBC w/Diff   Other Visit Diagnoses       Allergic reaction, initial encounter       Prednisone  taper sent. Ongoing issue with anticoagulants (Eliquis , Pradaxa , Xerelto, and Coumadin ). Will switch patient to Lovenox .        Follow up plan: Return for 1pm Coag check with Dr. Vicci on August 15.

## 2023-12-15 NOTE — Telephone Encounter (Signed)
 Spoke with pt regarding reaction to coumadin . Pt has had a reaction to every DOAC that we have tried (Xarelto , Eliquis , Pradaxa ) at this point and is now experiencing hives with coumadin . Pt is now scheduled for a-fib ablation on 10/1 (on wait list for cancellations). Spoke with clinical pharmacist regarding this situation, Robbi Blanch, [facility], she recommends continuing with coumadin  at this point with the help of a maintenance dose of prednisone  along with anti-histamine. Pt was just given a new prescription for prednisone  by his primary care provider. Pt will complete taper per PCP and then continue with maintenance dose until he is able to safely stop coumadin . Pt is also taking zyrtec daily. Will send prescription for maintenance prednisone  10mg  daily. Pt and his wife are on board with this plan. We will only transition pt to lovenox  if pt finds that coumadin  is unbearable to continue. Pt and his wife have no further questions at this time.

## 2023-12-15 NOTE — Assessment & Plan Note (Signed)
 Chronic. Not well controlled.  HR 123 in office but has been using albuterol .  Continue with Metoprolol  and anticoagulation.

## 2023-12-16 ENCOUNTER — Ambulatory Visit: Payer: Self-pay | Admitting: Nurse Practitioner

## 2023-12-16 ENCOUNTER — Other Ambulatory Visit: Payer: Self-pay

## 2023-12-16 ENCOUNTER — Ambulatory Visit: Admitting: Nurse Practitioner

## 2023-12-16 ENCOUNTER — Telehealth: Payer: Self-pay | Admitting: Nurse Practitioner

## 2023-12-16 DIAGNOSIS — I482 Chronic atrial fibrillation, unspecified: Secondary | ICD-10-CM

## 2023-12-16 LAB — COMPREHENSIVE METABOLIC PANEL WITH GFR
ALT: 21 IU/L (ref 0–44)
AST: 32 IU/L (ref 0–40)
Albumin: 4.1 g/dL (ref 3.9–4.9)
Alkaline Phosphatase: 116 IU/L (ref 44–121)
BUN/Creatinine Ratio: 14 (ref 10–24)
BUN: 13 mg/dL (ref 8–27)
Bilirubin Total: 0.6 mg/dL (ref 0.0–1.2)
CO2: 22 mmol/L (ref 20–29)
Calcium: 9.5 mg/dL (ref 8.6–10.2)
Chloride: 94 mmol/L — ABNORMAL LOW (ref 96–106)
Creatinine, Ser: 0.91 mg/dL (ref 0.76–1.27)
Globulin, Total: 2.8 g/dL (ref 1.5–4.5)
Glucose: 109 mg/dL — ABNORMAL HIGH (ref 70–99)
Potassium: 3.7 mmol/L (ref 3.5–5.2)
Sodium: 137 mmol/L (ref 134–144)
Total Protein: 6.9 g/dL (ref 6.0–8.5)
eGFR: 91 mL/min/1.73 (ref >59)

## 2023-12-16 LAB — CBC WITH DIFFERENTIAL/PLATELET
Basophils Absolute: 0 x10E3/uL (ref 0.0–0.2)
Basos: 0 %
EOS (ABSOLUTE): 0.1 x10E3/uL (ref 0.0–0.4)
Eos: 1 %
Hematocrit: 42.5 % (ref 37.5–51.0)
Hemoglobin: 14.5 g/dL (ref 13.0–17.7)
Immature Grans (Abs): 0 x10E3/uL (ref 0.0–0.1)
Immature Granulocytes: 0 %
Lymphocytes Absolute: 1.4 x10E3/uL (ref 0.7–3.1)
Lymphs: 15 %
MCH: 31.1 pg (ref 26.6–33.0)
MCHC: 34.1 g/dL (ref 31.5–35.7)
MCV: 91 fL (ref 79–97)
Monocytes Absolute: 0.7 x10E3/uL (ref 0.1–0.9)
Monocytes: 8 %
Neutrophils Absolute: 6.9 x10E3/uL (ref 1.4–7.0)
Neutrophils: 76 %
Platelets: 269 x10E3/uL (ref 150–450)
RBC: 4.66 x10E6/uL (ref 4.14–5.80)
RDW: 12.6 % (ref 11.6–15.4)
WBC: 9.1 x10E3/uL (ref 3.4–10.8)

## 2023-12-16 LAB — LIPID PANEL
Chol/HDL Ratio: 3.7 ratio (ref 0.0–5.0)
Cholesterol, Total: 168 mg/dL (ref 100–199)
HDL: 45 mg/dL (ref >39)
LDL Chol Calc (NIH): 107 mg/dL — ABNORMAL HIGH (ref 0–99)
Triglycerides: 88 mg/dL (ref 0–149)
VLDL Cholesterol Cal: 16 mg/dL (ref 5–40)

## 2023-12-16 MED ORDER — ENOXAPARIN SODIUM 150 MG/ML IJ SOSY: 150.0000 mg | PREFILLED_SYRINGE | INTRAMUSCULAR | 2 refills | Status: DC

## 2023-12-16 MED ORDER — WARFARIN SODIUM 5 MG PO TABS: ORAL_TABLET | ORAL | 1 refills | Status: DC

## 2023-12-16 NOTE — Telephone Encounter (Signed)
 Per pharmacist recommendations, Lovenox  150mg  Q24H was sent to the pharmacy.  Will instruct patient to skip coumadin  dose today due to INR being above 3 and start Lovenox  tomorrow.

## 2023-12-21 ENCOUNTER — Other Ambulatory Visit: Payer: Self-pay | Admitting: Nurse Practitioner

## 2023-12-21 ENCOUNTER — Encounter

## 2023-12-23 ENCOUNTER — Encounter: Payer: Self-pay | Admitting: Family Medicine

## 2023-12-23 ENCOUNTER — Ambulatory Visit (INDEPENDENT_AMBULATORY_CARE_PROVIDER_SITE_OTHER): Admitting: Family Medicine

## 2023-12-23 VITALS — BP 108/72 | HR 105 | Temp 97.5°F | Ht 70.0 in | Wt 211.4 lb

## 2023-12-23 DIAGNOSIS — I482 Chronic atrial fibrillation, unspecified: Secondary | ICD-10-CM

## 2023-12-23 LAB — COAGUCHEK XS/INR WAIVED
INR: 4.1 — ABNORMAL HIGH (ref 0.9–1.1)
Prothrombin Time: 49.4 s

## 2023-12-23 NOTE — Assessment & Plan Note (Signed)
 Supratherapeutic INR at 4.1- up from last week. Will cut his coumadin  to 2.5mg  daily and recheck any day after Tuesday next week. Call with any concerns.

## 2023-12-23 NOTE — Progress Notes (Signed)
 BP 108/72   Pulse (!) 105   Temp (!) 97.5 F (36.4 C) (Oral)   Ht 5' 10 (1.778 m)   Wt 211 lb 6.4 oz (95.9 kg)   SpO2 95%   BMI 30.33 kg/m    Subjective:    Patient ID: Charles Bryant, male    DOB: October 29, 1955, 68 y.o.   MRN: [id number]  CC: Coumadin  management  HPI: This patient is a 68 y.o. male who presents for coumadin  management. The expected duration of coumadin  treatment is lifelong The reason for anticoagulation is  A. Fib.  Present Coumadin  dose: 5mg  M, W, F, and 2.5mg  Tues, Thurs, Sat, Sunday.  Goal: 2.0-3.0  Excessive bruising: no Nose bleeding: no Rectal bleeding: no Prolonged menstrual cycles: N/A Eating diet with consistent amounts of foods containing Vitamin K:yes Any recent antibiotic use? no  Relevant past medical, surgical, family and social history reviewed and updated as indicated. Interim medical history since our last visit reviewed. Allergies and medications reviewed and updated.  Review of Systems  Constitutional: Negative.   Respiratory: Negative.    Cardiovascular: Negative.   Musculoskeletal: Negative.   Neurological: Negative.   Psychiatric/Behavioral: Negative.          Objective:    BP 108/72   Pulse (!) 105   Temp (!) 97.5 F (36.4 C) (Oral)   Ht 5' 10 (1.778 m)   Wt 211 lb 6.4 oz (95.9 kg)   SpO2 95%   BMI 30.33 kg/m   Wt Readings from Last 3 Encounters:  12/23/23 211 lb 6.4 oz (95.9 kg)  12/15/23 210 lb 12.8 oz (95.6 kg)  10/31/23 213 lb (96.6 kg)     Physical Exam Vitals and nursing note reviewed.  Constitutional:      General: He is not in acute distress.    Appearance: Normal appearance. He is not ill-appearing, toxic-appearing or diaphoretic.  HENT:     Head: Normocephalic and atraumatic.     Right Ear: External ear normal.     Left Ear: External ear normal.     Nose: Nose normal.     Mouth/Throat:     Mouth: Mucous membranes are moist.     Pharynx: Oropharynx is clear.  Eyes:     General: No scleral icterus.        Right eye: No discharge.        Left eye: No discharge.     Extraocular Movements: Extraocular movements intact.     Conjunctiva/sclera: Conjunctivae normal.     Pupils: Pupils are equal, round, and reactive to light.  Cardiovascular:     Rate and Rhythm: Normal rate and regular rhythm.     Pulses: Normal pulses.     Heart sounds: Normal heart sounds. No murmur heard.    No friction rub. No gallop.  Pulmonary:     Effort: Pulmonary effort is normal. No respiratory distress.     Breath sounds: Normal breath sounds. No stridor. No wheezing, rhonchi or rales.  Chest:     Chest wall: No tenderness.  Musculoskeletal:        General: Normal range of motion.     Cervical back: Normal range of motion and neck supple.  Skin:    General: Skin is warm and dry.     Capillary Refill: Capillary refill takes less than 2 seconds.     Coloration: Skin is not jaundiced or pale.     Findings: No bruising, erythema, lesion or rash.  Neurological:  General: No focal deficit present.     Mental Status: He is alert and oriented to person, place, and time. Mental status is at baseline.  Psychiatric:        Mood and Affect: Mood normal.        Behavior: Behavior normal.        Thought Content: Thought content normal.        Judgment: Judgment normal.     Last CBC:  Lab Results  Component Value Date   WBC 9.1 12/15/2023   HGB 14.5 12/15/2023   HCT 42.5 12/15/2023   MCV 91 12/15/2023   PLT 269 12/15/2023    Results for orders placed or performed in visit on 12/23/23  CoaguChek XS/INR Waived   Collection Time: 12/23/23  1:23 PM  Result Value Ref Range   INR 4.1 (H) 0.9 - 1.1   Prothrombin Time 49.4 sec       Assessment:   Problem List Items Addressed This Visit       Cardiovascular and Mediastinum   Atrial fibrillation, chronic (HCC) - Primary   Supratherapeutic INR at 4.1- up from last week. Will cut his coumadin  to 2.5mg  daily and recheck any day after Tuesday next week.  Call with any concerns.       Relevant Orders   CoaguChek XS/INR Waived (Completed)

## 2023-12-23 NOTE — Telephone Encounter (Signed)
 Too soon for refill. LRF 07/14/23 for 90 and 1 RF.  Requested Prescriptions  Pending Prescriptions Disp Refills   amLODipine  (NORVASC ) 10 MG tablet [Pharmacy Med Name: amLODIPine  Besylate 10 MG Oral Tablet] 80 tablet 3    Sig: TAKE 1 TABLET BY MOUTH DAILY     Cardiovascular: Calcium  Channel Blockers 2 Passed - 12/23/2023  4:26 PM      Passed - Last BP in normal range    BP Readings from Last 1 Encounters:  12/23/23 108/72         Passed - Last Heart Rate in normal range    Pulse Readings from Last 1 Encounters:  12/23/23 (!) 105         Passed - Valid encounter within last 6 months    Recent Outpatient Visits           Today Atrial fibrillation, chronic (HCC)   [place] [facility] [place] [facility], Megan P, DO   1 week ago Chronic atrial fibrillation [facility])   [place] [facility] Melvin Pao, NP   1 month ago Rash   [place] [facility] Melvin Pao, NP   1 month ago Rash   [place] [facility] Melvin Pao, NP   3 months ago Hives   [place] [facility] Melvin Pao, NP

## 2023-12-26 ENCOUNTER — Other Ambulatory Visit: Payer: Self-pay | Admitting: Cardiovascular Disease

## 2023-12-26 ENCOUNTER — Other Ambulatory Visit: Payer: Self-pay | Admitting: Nurse Practitioner

## 2023-12-28 NOTE — Telephone Encounter (Signed)
 Requested by interface surescripts.  Requested Prescriptions  Pending Prescriptions Disp Refills   hydrochlorothiazide  (HYDRODIURIL ) 12.5 MG tablet [Pharmacy Med Name: hydroCHLOROthiazide  12.5 MG Oral Tablet] 100 tablet 1    Sig: TAKE 1 TABLET BY MOUTH DAILY     Cardiovascular: Diuretics - Thiazide Passed - 12/28/2023  2:19 PM      Passed - Cr in normal range and within 180 days    Creatinine, Ser  Date Value Ref Range Status  12/15/2023 0.91 0.76 - 1.27 mg/dL Final         Passed - K in normal range and within 180 days    Potassium  Date Value Ref Range Status  12/15/2023 3.7 3.5 - 5.2 mmol/L Final         Passed - Na in normal range and within 180 days    Sodium  Date Value Ref Range Status  12/15/2023 137 134 - 144 mmol/L Final         Passed - Last BP in normal range    BP Readings from Last 1 Encounters:  12/23/23 108/72         Passed - Valid encounter within last 6 months    Recent Outpatient Visits           5 days ago Atrial fibrillation, chronic (HCC)   [place] [facility] [place], Megan P, DO   1 week ago Chronic atrial fibrillation [facility])   [place] [facility] Melvin Pao, NP   1 month ago Rash   [place] [facility] Melvin Pao, NP   2 months ago Rash   [place] [facility] Melvin Pao, NP   3 months ago Hives   [place] [facility] Melvin Pao, NP

## 2023-12-29 ENCOUNTER — Encounter: Payer: Self-pay | Admitting: Nurse Practitioner

## 2023-12-29 ENCOUNTER — Ambulatory Visit (INDEPENDENT_AMBULATORY_CARE_PROVIDER_SITE_OTHER): Admitting: Nurse Practitioner

## 2023-12-29 ENCOUNTER — Ambulatory Visit: Payer: Self-pay | Admitting: Nurse Practitioner

## 2023-12-29 VITALS — BP 117/78 | HR 90 | Temp 98.0°F | Resp 16 | Ht 70.0 in | Wt 213.8 lb

## 2023-12-29 DIAGNOSIS — D6869 Other thrombophilia: Secondary | ICD-10-CM | POA: Diagnosis not present

## 2023-12-29 DIAGNOSIS — Z1211 Encounter for screening for malignant neoplasm of colon: Secondary | ICD-10-CM

## 2023-12-29 DIAGNOSIS — I4819 Other persistent atrial fibrillation: Secondary | ICD-10-CM

## 2023-12-29 LAB — COAGUCHEK XS/INR WAIVED
INR: 1.7 — ABNORMAL HIGH (ref 0.9–1.1)
Prothrombin Time: 19.8 s

## 2023-12-29 NOTE — Progress Notes (Signed)
 BP 117/78 (BP Location: Left Arm, Patient Position: Sitting, Cuff Size: Normal)   Pulse 90   Temp 98 F (36.7 C) (Oral)   Resp 16   Ht 5' 10 (1.778 m)   Wt 213 lb 12.8 oz (97 kg)   SpO2 96%   BMI 30.68 kg/m    Subjective:    Patient ID: Charles Bryant, male    DOB: 1955/10/22, 68 y.o.   MRN: [id number]  CC: Coumadin  management  HPI: This patient is a 68 y.o. male who presents for coumadin  management. The expected duration of coumadin  treatment is lifelong The reason for anticoagulation is  A. Fib.  Present Coumadin  dose: 2.5mg  daily Goal: 2.0-3.0  Excessive bruising: no Nose bleeding: no Rectal bleeding: no Prolonged menstrual cycles: N/A Eating diet with consistent amounts of foods containing Vitamin K:yes Any recent antibiotic use? no  Relevant past medical, surgical, family and social history reviewed and updated as indicated. Interim medical history since our last visit reviewed. Allergies and medications reviewed and updated.  Review of Systems  Constitutional: Negative.   Respiratory: Negative.    Cardiovascular: Negative.   Musculoskeletal: Negative.   Neurological: Negative.   Psychiatric/Behavioral: Negative.          Objective:    BP 117/78 (BP Location: Left Arm, Patient Position: Sitting, Cuff Size: Normal)   Pulse 90   Temp 98 F (36.7 C) (Oral)   Resp 16   Ht 5' 10 (1.778 m)   Wt 213 lb 12.8 oz (97 kg)   SpO2 96%   BMI 30.68 kg/m   Wt Readings from Last 3 Encounters:  12/29/23 213 lb 12.8 oz (97 kg)  12/23/23 211 lb 6.4 oz (95.9 kg)  12/15/23 210 lb 12.8 oz (95.6 kg)     Physical Exam Vitals and nursing note reviewed.  Constitutional:      General: He is not in acute distress.    Appearance: Normal appearance. He is not ill-appearing, toxic-appearing or diaphoretic.  HENT:     Head: Normocephalic and atraumatic.     Right Ear: External ear normal.     Left Ear: External ear normal.     Nose: Nose normal.     Mouth/Throat:      Mouth: Mucous membranes are moist.     Pharynx: Oropharynx is clear.  Eyes:     General: No scleral icterus.       Right eye: No discharge.        Left eye: No discharge.     Extraocular Movements: Extraocular movements intact.     Conjunctiva/sclera: Conjunctivae normal.     Pupils: Pupils are equal, round, and reactive to light.  Cardiovascular:     Rate and Rhythm: Normal rate and regular rhythm.     Pulses: Normal pulses.     Heart sounds: Normal heart sounds. No murmur heard.    No friction rub. No gallop.  Pulmonary:     Effort: Pulmonary effort is normal. No respiratory distress.     Breath sounds: Normal breath sounds. No stridor. No wheezing, rhonchi or rales.  Chest:     Chest wall: No tenderness.  Musculoskeletal:        General: Normal range of motion.     Cervical back: Normal range of motion and neck supple.  Skin:    General: Skin is warm and dry.     Capillary Refill: Capillary refill takes less than 2 seconds.     Coloration: Skin is not jaundiced  or pale.     Findings: No bruising, erythema, lesion or rash.  Neurological:     General: No focal deficit present.     Mental Status: He is alert and oriented to person, place, and time. Mental status is at baseline.  Psychiatric:        Mood and Affect: Mood normal.        Behavior: Behavior normal.        Thought Content: Thought content normal.        Judgment: Judgment normal.     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  Currently on Coumadin  and having allergic reaction.  INR in office was 1.7 today. Continue with current dose.  Decrease amount of green veggies in diet.  He has been eating a lot of them.  Follow up in 1 weeks.         Relevant Orders   CoaguChek XS/INR Waived (STAT)   Other Visit Diagnoses       Screening for colon cancer       Relevant Orders   Cologuard

## 2023-12-29 NOTE — Assessment & Plan Note (Signed)
 Chronic.  Currently on Coumadin  and having allergic reaction.  INR in office was 1.7 today. Continue with current dose.  Decrease amount of green veggies in diet.  He has been eating a lot of them.  Follow up in 1 weeks.

## 2024-01-05 ENCOUNTER — Encounter: Payer: Self-pay | Admitting: Nurse Practitioner

## 2024-01-05 ENCOUNTER — Ambulatory Visit (INDEPENDENT_AMBULATORY_CARE_PROVIDER_SITE_OTHER): Admitting: Nurse Practitioner

## 2024-01-05 VITALS — BP 118/72 | HR 77 | Temp 98.3°F | Ht 70.0 in | Wt 216.8 lb

## 2024-01-05 DIAGNOSIS — D6869 Other thrombophilia: Secondary | ICD-10-CM | POA: Diagnosis not present

## 2024-01-05 DIAGNOSIS — I4819 Other persistent atrial fibrillation: Secondary | ICD-10-CM

## 2024-01-05 LAB — COAGUCHEK XS/INR WAIVED
INR: 2 — ABNORMAL HIGH (ref 0.9–1.1)
Prothrombin Time: 24.1 s

## 2024-01-05 NOTE — Progress Notes (Signed)
 BP 118/72   Pulse 77   Temp 98.3 F (36.8 C) (Oral)   Ht 5' 10 (1.778 m)   Wt 216 lb 12.8 oz (98.3 kg)   SpO2 97%   BMI 31.11 kg/m    Subjective:    Patient ID: Charles Bryant, male    DOB: [birth year], 68 y.o.   MRN: [id number]  CC: Coumadin  management  HPI: This patient is a 68 y.o. male who presents for coumadin  management. The expected duration of coumadin  treatment is lifelong The reason for anticoagulation is  A. Fib.  Present Coumadin  dose: 2.5mg  daily INR in office was 2.0 today Goal: 2.0-3.0  Excessive bruising: no Nose bleeding: no Rectal bleeding: no Prolonged menstrual cycles: N/A Eating diet with consistent amounts of foods containing Vitamin K:yes Any recent antibiotic use? no  Relevant past medical, surgical, family and social history reviewed and updated as indicated. Interim medical history since our last visit reviewed. Allergies and medications reviewed and updated.  Review of Systems  Constitutional: Negative.   Respiratory: Negative.    Cardiovascular: Negative.   Musculoskeletal: Negative.   Neurological: Negative.   Psychiatric/Behavioral: Negative.          Objective:    BP 118/72   Pulse 77   Temp 98.3 F (36.8 C) (Oral)   Ht 5' 10 (1.778 m)   Wt 216 lb 12.8 oz (98.3 kg)   SpO2 97%   BMI 31.11 kg/m   Wt Readings from Last 3 Encounters:  01/05/24 216 lb 12.8 oz (98.3 kg)  12/29/23 213 lb 12.8 oz (97 kg)  12/23/23 211 lb 6.4 oz (95.9 kg)     Physical Exam Vitals and nursing note reviewed.  Constitutional:      General: He is not in acute distress.    Appearance: Normal appearance. He is not ill-appearing, toxic-appearing or diaphoretic.  HENT:     Head: Normocephalic and atraumatic.     Right Ear: External ear normal.     Left Ear: External ear normal.     Nose: Nose normal.     Mouth/Throat:     Mouth: Mucous membranes are moist.     Pharynx: Oropharynx is clear.  Eyes:     General: No scleral icterus.       Right eye:  No discharge.        Left eye: No discharge.     Extraocular Movements: Extraocular movements intact.     Conjunctiva/sclera: Conjunctivae normal.     Pupils: Pupils are equal, round, and reactive to light.  Cardiovascular:     Rate and Rhythm: Normal rate and regular rhythm.     Pulses: Normal pulses.     Heart sounds: Normal heart sounds. No murmur heard.    No friction rub. No gallop.  Pulmonary:     Effort: Pulmonary effort is normal. No respiratory distress.     Breath sounds: Normal breath sounds. No stridor. No wheezing, rhonchi or rales.  Chest:     Chest wall: No tenderness.  Musculoskeletal:        General: Normal range of motion.     Cervical back: Normal range of motion and neck supple.  Skin:    General: Skin is warm and dry.     Capillary Refill: Capillary refill takes less than 2 seconds.     Coloration: Skin is not jaundiced or pale.     Findings: No bruising, erythema, lesion or rash.  Neurological:     General: No focal  deficit present.     Mental Status: He is alert and oriented to person, place, and time. Mental status is at baseline.  Psychiatric:        Mood and Affect: Mood normal.        Behavior: Behavior normal.        Thought Content: Thought content normal.        Judgment: Judgment normal.     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  Currently on Coumadin  and having allergic reaction.  INR in office was 2.0 today. Continue with  current dose.  Decrease amount of green veggies in diet.  He has been eating a lot of them.  Follow up in 3 weeks.         Relevant Orders   CoaguChek XS/INR Waived (STAT)

## 2024-01-05 NOTE — Assessment & Plan Note (Signed)
 Chronic.  Currently on Coumadin  and having allergic reaction.  INR in office was 2.0 today. Continue with current dose.  Decrease amount of green veggies in diet.  He has been eating a lot of them.  Follow up in 3 weeks.

## 2024-01-06 ENCOUNTER — Ambulatory Visit: Payer: Self-pay | Admitting: Nurse Practitioner

## 2024-01-10 ENCOUNTER — Other Ambulatory Visit: Payer: Self-pay | Admitting: Emergency Medicine

## 2024-01-10 ENCOUNTER — Encounter: Payer: Self-pay | Admitting: Nurse Practitioner

## 2024-01-10 DIAGNOSIS — I480 Paroxysmal atrial fibrillation: Secondary | ICD-10-CM

## 2024-01-11 ENCOUNTER — Telehealth (HOSPITAL_COMMUNITY): Payer: Self-pay

## 2024-01-11 LAB — CBC
Hematocrit: 40.8 % (ref 37.5–51.0)
Hemoglobin: 13.4 g/dL (ref 13.0–17.7)
MCH: 30.7 pg (ref 26.6–33.0)
MCHC: 32.8 g/dL (ref 31.5–35.7)
MCV: 93 fL (ref 79–97)
Platelets: 203 x10E3/uL (ref 150–450)
RBC: 4.37 x10E6/uL (ref 4.14–5.80)
RDW: 12.8 % (ref 11.6–15.4)
WBC: 8 x10E3/uL (ref 3.4–10.8)

## 2024-01-11 LAB — BASIC METABOLIC PANEL WITH GFR
BUN/Creatinine Ratio: 14 (ref 10–24)
BUN: 11 mg/dL (ref 8–27)
CO2: 24 mmol/L (ref 20–29)
Calcium: 9.7 mg/dL (ref 8.6–10.2)
Chloride: 101 mmol/L (ref 96–106)
Creatinine, Ser: 0.8 mg/dL (ref 0.76–1.27)
Glucose: 129 mg/dL — ABNORMAL HIGH (ref 70–99)
Potassium: 4.8 mmol/L (ref 3.5–5.2)
Sodium: 141 mmol/L (ref 134–144)
eGFR: 95 mL/min/1.73 (ref >59)

## 2024-01-11 NOTE — Telephone Encounter (Signed)
 Spoke with patient to complete pre-procedure call.     Health status review:  Any new medical conditions, recent signs of acute illness or been started on antibiotics? No Any recent hospitalizations or surgeries? No Any new medications started since pre-op visit? No  Follow all medication instructions prior to procedure or the procedure may be rescheduled:    Continue taking Coumadin  (Warfarin) daily as directed without missing any doses before procedure. Essential chronic medications:  Yes; Prednisone  should be continued, unless told otherwise. On the morning of your procedure take only Prednisone  with a small sip of water. Hold all other medications, including Coumadin  (Warfarin).  Nothing to eat or drink after midnight prior to your procedure.  Pre-procedure testing scheduled: CT completed on July 18 and lab work completed on September 2.  Confirmed patient is scheduled for Atrial Fibrillation Ablation on Wednesday, September 1 with Dr. Sidra Kitty. Instructed patient to arrive at the Main Entrance A at [facility]: [address] [place], [place] [address] and check in at Admitting at 6:30 AM.  Advised of plan to go home the same day and will only stay overnight if medically necessary. You MUST have a responsible adult to drive you home and MUST be with you the first 24 hours after you arrive home or your procedure could be cancelled.  Informed patient a nurse will call a day before the procedure to confirm arrival time and ensure instructions are followed.  Patient verbalized understanding to information provided and is agreeable to proceed with procedure.   Advised patient to contact RN Navigator at [phone], to inform of any new medications started after call or concerns prior to procedure.

## 2024-01-12 ENCOUNTER — Encounter: Payer: Self-pay | Admitting: Nurse Practitioner

## 2024-01-12 ENCOUNTER — Other Ambulatory Visit: Payer: Self-pay

## 2024-01-12 ENCOUNTER — Ambulatory Visit: Admitting: Nurse Practitioner

## 2024-01-12 ENCOUNTER — Ambulatory Visit (INDEPENDENT_AMBULATORY_CARE_PROVIDER_SITE_OTHER): Admitting: Nurse Practitioner

## 2024-01-12 VITALS — BP 116/67 | HR 87 | Temp 98.5°F | Ht 70.0 in | Wt 214.8 lb

## 2024-01-12 DIAGNOSIS — I482 Chronic atrial fibrillation, unspecified: Secondary | ICD-10-CM

## 2024-01-12 LAB — COAGUCHEK XS/INR WAIVED
INR: 2.1 — ABNORMAL HIGH (ref 0.9–1.1)
Prothrombin Time: 25.2 s

## 2024-01-12 MED ORDER — FLUTICASONE-SALMETEROL 500-50 MCG/ACT IN AEPB: INHALATION_SPRAY | RESPIRATORY_TRACT | 3 refills | Status: AC

## 2024-01-12 NOTE — Progress Notes (Signed)
 BP 116/67   Pulse 87   Temp 98.5 F (36.9 C) (Oral)   Ht 5' 10 (1.778 m)   Wt 214 lb 12.8 oz (97.4 kg)   SpO2 95%   BMI 30.82 kg/m    Subjective:    Patient ID: Charles Bryant, male    DOB: [birth year], 68 y.o.   MRN: [id number]  CC: Coumadin  management  HPI: This patient is a 68 y.o. male who presents for coumadin  management. The expected duration of coumadin  treatment is lifelong The reason for anticoagulation is  A. Fib.  Present Coumadin  dose: 2.5mg  daily INR in office was 2.1 today.  Patient needs 3 in range INRs prior to Ablation on 10/1. Goal: 2.0-3.0  Excessive bruising: no Nose bleeding: no Rectal bleeding: no Prolonged menstrual cycles: N/A Eating diet with consistent amounts of foods containing Vitamin K:yes Any recent antibiotic use? no  Relevant past medical, surgical, family and social history reviewed and updated as indicated. Interim medical history since our last visit reviewed. Allergies and medications reviewed and updated.  Review of Systems  Constitutional: Negative.   Respiratory: Negative.    Cardiovascular: Negative.   Musculoskeletal: Negative.   Neurological: Negative.   Psychiatric/Behavioral: Negative.          Objective:    BP 116/67   Pulse 87   Temp 98.5 F (36.9 C) (Oral)   Ht 5' 10 (1.778 m)   Wt 214 lb 12.8 oz (97.4 kg)   SpO2 95%   BMI 30.82 kg/m   Wt Readings from Last 3 Encounters:  01/12/24 214 lb 12.8 oz (97.4 kg)  01/05/24 216 lb 12.8 oz (98.3 kg)  12/29/23 213 lb 12.8 oz (97 kg)     Physical Exam Vitals and nursing note reviewed.  Constitutional:      General: He is not in acute distress.    Appearance: Normal appearance. He is not ill-appearing, toxic-appearing or diaphoretic.  HENT:     Head: Normocephalic and atraumatic.     Right Ear: External ear normal.     Left Ear: External ear normal.     Nose: Nose normal.     Mouth/Throat:     Mouth: Mucous membranes are moist.     Pharynx: Oropharynx is clear.   Eyes:     General: No scleral icterus.       Right eye: No discharge.        Left eye: No discharge.     Extraocular Movements: Extraocular movements intact.     Conjunctiva/sclera: Conjunctivae normal.     Pupils: Pupils are equal, round, and reactive to light.  Cardiovascular:     Rate and Rhythm: Normal rate and regular rhythm.     Pulses: Normal pulses.     Heart sounds: Normal heart sounds. No murmur heard.    No friction rub. No gallop.  Pulmonary:     Effort: Pulmonary effort is normal. No respiratory distress.     Breath sounds: Normal breath sounds. No stridor. No wheezing, rhonchi or rales.  Chest:     Chest wall: No tenderness.  Musculoskeletal:        General: Normal range of motion.     Cervical back: Normal range of motion and neck supple.  Skin:    General: Skin is warm and dry.     Capillary Refill: Capillary refill takes less than 2 seconds.     Coloration: Skin is not jaundiced or pale.     Findings: No bruising, erythema,  lesion or rash.  Neurological:     General: No focal deficit present.     Mental Status: He is alert and oriented to person, place, and time. Mental status is at baseline.  Psychiatric:        Mood and Affect: Mood normal.        Behavior: Behavior normal.        Thought Content: Thought content normal.        Judgment: Judgment normal.     Last CBC:  Lab Results  Component Value Date   WBC 8.0 01/10/2024   HGB 13.4 01/10/2024   HCT 40.8 01/10/2024   MCV 93 01/10/2024   PLT 203 01/10/2024    Results for orders placed or performed in visit on 01/10/24  CBC   Collection Time: 01/10/24  8:13 AM  Result Value Ref Range   WBC 8.0 3.4 - 10.8 x10E3/uL   RBC 4.37 4.14 - 5.80 x10E6/uL   Hemoglobin 13.4 13.0 - 17.7 g/dL   Hematocrit 59.1 62.4 - 51.0 %   MCV 93 79 - 97 fL   MCH 30.7 26.6 - 33.0 pg   MCHC 32.8 31.5 - 35.7 g/dL   RDW 87.1 88.3 - 84.5 %   Platelets 203 150 - 450 x10E3/uL  Basic metabolic panel with GFR   Collection  Time: 01/10/24  8:13 AM  Result Value Ref Range   Glucose 129 (H) 70 - 99 mg/dL   BUN 11 8 - 27 mg/dL   Creatinine, Ser 9.19 0.76 - 1.27 mg/dL   eGFR 95 >40 fO/fpw/8.26   BUN/Creatinine Ratio 14 10 - 24   Sodium 141 134 - 144 mmol/L   Potassium 4.8 3.5 - 5.2 mmol/L   Chloride 101 96 - 106 mmol/L   CO2 24 20 - 29 mmol/L   Calcium  9.7 8.6 - 10.2 mg/dL       Assessment:   Problem List Items Addressed This Visit       Cardiovascular and Mediastinum   Atrial fibrillation, chronic (HCC) - Primary   INR at 2.1- up from last week. Will continue with current dose of Coumadin  5mg  daily.  Follow up in 2 weeks.  This is patient's 2nd INR within range.       Relevant Orders   CoaguChek XS/INR Waived (STAT)

## 2024-01-12 NOTE — Telephone Encounter (Signed)
 Requesting RX be sent to mail order.

## 2024-01-12 NOTE — Assessment & Plan Note (Signed)
 INR at 2.1- up from last week. Will continue with current dose of Coumadin  5mg  daily.  Follow up in 2 weeks.  This is patient's 2nd INR within range.

## 2024-01-13 ENCOUNTER — Ambulatory Visit: Payer: Self-pay | Admitting: Nurse Practitioner

## 2024-01-15 ENCOUNTER — Ambulatory Visit: Payer: Self-pay | Admitting: Cardiology

## 2024-01-26 ENCOUNTER — Ambulatory Visit (INDEPENDENT_AMBULATORY_CARE_PROVIDER_SITE_OTHER): Admitting: Nurse Practitioner

## 2024-01-26 ENCOUNTER — Ambulatory Visit: Payer: Self-pay | Admitting: Nurse Practitioner

## 2024-01-26 ENCOUNTER — Encounter: Payer: Self-pay | Admitting: Nurse Practitioner

## 2024-01-26 VITALS — BP 117/81 | HR 109 | Temp 98.4°F | Ht 70.0 in | Wt 220.8 lb

## 2024-01-26 DIAGNOSIS — I482 Chronic atrial fibrillation, unspecified: Secondary | ICD-10-CM

## 2024-01-26 DIAGNOSIS — D6869 Other thrombophilia: Secondary | ICD-10-CM

## 2024-01-26 LAB — COAGUCHEK XS/INR WAIVED
INR: 2.1 — ABNORMAL HIGH (ref 0.9–1.1)
Prothrombin Time: 24.7 s

## 2024-01-26 NOTE — Assessment & Plan Note (Signed)
 Chronic.  Currently on Coumadin  and having allergic reaction.  INR in office was 2.1 today. Continue with current dose.  Decrease amount of green veggies in diet.  He has been eating a lot of them.  Follow up in 3 weeks.   He has now had the 3 in range INRs required by Cardiology prior to his Ablation on October 8.

## 2024-01-26 NOTE — Progress Notes (Signed)
 BP 117/81   Pulse (!) 109   Temp 98.4 F (36.9 C) (Oral)   Ht 5' 10 (1.778 m)   Wt 220 lb 12.8 oz (100.2 kg)   SpO2 97%   BMI 31.68 kg/m    Subjective:    Patient ID: Charles Bryant, male    DOB: [birth year], 68 y.o.   MRN: [id number]  CC: Coumadin  management  HPI: This patient is a 68 y.o. male who presents for coumadin  management. The expected duration of coumadin  treatment is lifelong The reason for anticoagulation is  A. Fib.  Present Coumadin  dose: 2.5mg  daily INR in office was 2.1 today.  This is patient's 3rd in range INR prior to Ablation on 10/1. Goal: 2.0-3.0  Excessive bruising: no Nose bleeding: no Rectal bleeding: no Prolonged menstrual cycles: N/A Eating diet with consistent amounts of foods containing Vitamin K:yes Any recent antibiotic use? no  Relevant past medical, surgical, family and social history reviewed and updated as indicated. Interim medical history since our last visit reviewed. Allergies and medications reviewed and updated.  Review of Systems  Constitutional: Negative.   Respiratory: Negative.    Cardiovascular: Negative.   Musculoskeletal: Negative.   Neurological: Negative.   Psychiatric/Behavioral: Negative.          Objective:    BP 117/81   Pulse (!) 109   Temp 98.4 F (36.9 C) (Oral)   Ht 5' 10 (1.778 m)   Wt 220 lb 12.8 oz (100.2 kg)   SpO2 97%   BMI 31.68 kg/m   Wt Readings from Last 3 Encounters:  01/26/24 220 lb 12.8 oz (100.2 kg)  01/12/24 214 lb 12.8 oz (97.4 kg)  01/05/24 216 lb 12.8 oz (98.3 kg)     Physical Exam Vitals and nursing note reviewed.  Constitutional:      General: He is not in acute distress.    Appearance: Normal appearance. He is not ill-appearing, toxic-appearing or diaphoretic.  HENT:     Head: Normocephalic and atraumatic.     Right Ear: External ear normal.     Left Ear: External ear normal.     Nose: Nose normal.     Mouth/Throat:     Mouth: Mucous membranes are moist.     Pharynx:  Oropharynx is clear.  Eyes:     General: No scleral icterus.       Right eye: No discharge.        Left eye: No discharge.     Extraocular Movements: Extraocular movements intact.     Conjunctiva/sclera: Conjunctivae normal.     Pupils: Pupils are equal, round, and reactive to light.  Cardiovascular:     Rate and Rhythm: Normal rate and regular rhythm.     Pulses: Normal pulses.     Heart sounds: Normal heart sounds. No murmur heard.    No friction rub. No gallop.  Pulmonary:     Effort: Pulmonary effort is normal. No respiratory distress.     Breath sounds: Normal breath sounds. No stridor. No wheezing, rhonchi or rales.  Chest:     Chest wall: No tenderness.  Musculoskeletal:        General: Normal range of motion.     Cervical back: Normal range of motion and neck supple.  Skin:    General: Skin is warm and dry.     Capillary Refill: Capillary refill takes less than 2 seconds.     Coloration: Skin is not jaundiced or pale.     Findings:  No bruising, erythema, lesion or rash.  Neurological:     General: No focal deficit present.     Mental Status: He is alert and oriented to person, place, and time. Mental status is at baseline.  Psychiatric:        Mood and Affect: Mood normal.        Behavior: Behavior normal.        Thought Content: Thought content normal.        Judgment: Judgment normal.     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  Currently on Coumadin  and  having allergic reaction.  INR in office was 2.1 today. Continue with current dose.  Decrease amount of green veggies in diet.  He has been eating a lot of them.  Follow up in 3 weeks.   He has now had the 3 in range INRs required by Cardiology prior to his Ablation on October 8.       Atrial fibrillation, chronic (HCC) - Primary   Relevant Orders   CoaguChek XS/INR Waived (STAT)

## 2024-02-07 NOTE — Pre-Procedure Instructions (Signed)
 Instructed patient on the following items: Arrival time 0830 Nothing to eat or drink after midnight No meds AM of procedure Responsible person to drive you home and stay with you for 24 hrs  Have you missed any doses of anti-coagulant Coumadin - takes once a day, hasn't missed any doses.

## 2024-02-08 ENCOUNTER — Ambulatory Visit (HOSPITAL_COMMUNITY)
Admission: RE | Admit: 2024-02-08 | Discharge: 2024-02-08 | Disposition: A | Discharge status: Home / Self Care | Attending: Cardiology | Admitting: Cardiology

## 2024-02-08 ENCOUNTER — Encounter (HOSPITAL_COMMUNITY)
Admission: RE | Disposition: A | Discharge status: Home / Self Care | Payer: Self-pay | Source: Home / Self Care | Attending: Cardiology

## 2024-02-08 ENCOUNTER — Ambulatory Visit (HOSPITAL_COMMUNITY)

## 2024-02-08 ENCOUNTER — Other Ambulatory Visit: Payer: Self-pay

## 2024-02-08 ENCOUNTER — Ambulatory Visit (HOSPITAL_BASED_OUTPATIENT_CLINIC_OR_DEPARTMENT_OTHER)

## 2024-02-08 DIAGNOSIS — I5030 Unspecified diastolic (congestive) heart failure: Secondary | ICD-10-CM | POA: Insufficient documentation

## 2024-02-08 DIAGNOSIS — Z79899 Other long term (current) drug therapy: Secondary | ICD-10-CM | POA: Insufficient documentation

## 2024-02-08 DIAGNOSIS — J449 Chronic obstructive pulmonary disease, unspecified: Secondary | ICD-10-CM

## 2024-02-08 DIAGNOSIS — I2510 Atherosclerotic heart disease of native coronary artery without angina pectoris: Secondary | ICD-10-CM | POA: Insufficient documentation

## 2024-02-08 DIAGNOSIS — D6869 Other thrombophilia: Secondary | ICD-10-CM | POA: Insufficient documentation

## 2024-02-08 DIAGNOSIS — Z7901 Long term (current) use of anticoagulants: Secondary | ICD-10-CM | POA: Diagnosis not present

## 2024-02-08 DIAGNOSIS — I4819 Other persistent atrial fibrillation: Secondary | ICD-10-CM | POA: Diagnosis present

## 2024-02-08 DIAGNOSIS — Z720 Tobacco use: Secondary | ICD-10-CM | POA: Insufficient documentation

## 2024-02-08 DIAGNOSIS — Z87891 Personal history of nicotine dependence: Secondary | ICD-10-CM

## 2024-02-08 DIAGNOSIS — I10 Essential (primary) hypertension: Secondary | ICD-10-CM

## 2024-02-08 DIAGNOSIS — I110 Hypertensive heart disease with heart failure: Secondary | ICD-10-CM | POA: Insufficient documentation

## 2024-02-08 DIAGNOSIS — E785 Hyperlipidemia, unspecified: Secondary | ICD-10-CM | POA: Diagnosis not present

## 2024-02-08 HISTORY — PX: ATRIAL FIBRILLATION ABLATION: EP1191

## 2024-02-08 LAB — PROTIME-INR
INR: 2 — ABNORMAL HIGH (ref 0.8–1.2)
Prothrombin Time: 24.1 s — ABNORMAL HIGH (ref 11.4–15.2)

## 2024-02-08 SURGERY — ATRIAL FIBRILLATION ABLATION
Anesthesia: General

## 2024-02-08 MED ORDER — ACETAMINOPHEN 325 MG PO TABS: 650.0000 mg | ORAL_TABLET | ORAL | Status: DC | PRN

## 2024-02-08 MED ORDER — SODIUM CHLORIDE 0.9% FLUSH: 3.0000 mL | INTRAVENOUS | Status: DC | PRN

## 2024-02-08 MED ORDER — SODIUM CHLORIDE 0.9 % IV SOLN: 250.0000 mL | INTRAVENOUS | Status: DC | PRN

## 2024-02-08 MED ORDER — SODIUM CHLORIDE 0.9% FLUSH: 3.0000 mL | Freq: Two times a day (BID) | INTRAVENOUS | Status: DC

## 2024-02-08 MED ORDER — FENTANYL CITRATE (PF) 100 MCG/2ML IJ SOLN
INTRAMUSCULAR | Status: AC
  Filled 2024-02-08: qty 2

## 2024-02-08 MED ORDER — LIDOCAINE 2% (20 MG/ML) 5 ML SYRINGE
INTRAMUSCULAR | Status: DC | PRN
  Administered 2024-02-08: 80 mg via INTRAVENOUS

## 2024-02-08 MED ORDER — FENTANYL CITRATE (PF) 250 MCG/5ML IJ SOLN
INTRAMUSCULAR | Status: DC | PRN
  Administered 2024-02-08: 100 ug via INTRAVENOUS

## 2024-02-08 MED ORDER — ONDANSETRON HCL 4 MG/2ML IJ SOLN
INTRAMUSCULAR | Status: DC | PRN
Start: 2024-02-08 — End: 2024-02-08
  Administered 2024-02-08: 4 mg via INTRAVENOUS

## 2024-02-08 MED ORDER — PROTAMINE SULFATE 10 MG/ML IV SOLN
INTRAVENOUS | Status: DC | PRN
Start: 2024-02-08 — End: 2024-02-08
  Administered 2024-02-08: 35 mg via INTRAVENOUS

## 2024-02-08 MED ORDER — ONDANSETRON HCL 4 MG/2ML IJ SOLN: 4.0000 mg | Freq: Four times a day (QID) | INTRAMUSCULAR | Status: DC | PRN

## 2024-02-08 MED ORDER — PHENYLEPHRINE HCL-NACL 20-0.9 MG/250ML-% IV SOLN
INTRAVENOUS | Status: DC | PRN
  Administered 2024-02-08: 20 ug/min via INTRAVENOUS

## 2024-02-08 MED ORDER — ATROPINE SULFATE 1 MG/ML IV SOLN
INTRAVENOUS | Status: DC | PRN
  Administered 2024-02-08: 1 mg via INTRAVENOUS

## 2024-02-08 MED ORDER — FENTANYL CITRATE (PF) 100 MCG/2ML IJ SOLN: 25.0000 ug | INTRAMUSCULAR | Status: DC | PRN

## 2024-02-08 MED ORDER — METOPROLOL SUCCINATE ER 50 MG PO TB24
50.0000 mg | ORAL_TABLET | Freq: Once | ORAL | Status: AC
  Administered 2024-02-08: 50 mg via ORAL
  Filled 2024-02-08: qty 1

## 2024-02-08 MED ORDER — ROCURONIUM BROMIDE 10 MG/ML (PF) SYRINGE
PREFILLED_SYRINGE | INTRAVENOUS | Status: DC | PRN
  Administered 2024-02-08: 80 mg via INTRAVENOUS
  Administered 2024-02-08: 10 mg via INTRAVENOUS

## 2024-02-08 MED ORDER — SODIUM CHLORIDE 0.9 % IV SOLN: INTRAVENOUS | Status: DC

## 2024-02-08 MED ORDER — PROPOFOL 10 MG/ML IV BOLUS
INTRAVENOUS | Status: DC | PRN
  Administered 2024-02-08: 200 mg via INTRAVENOUS

## 2024-02-08 MED ORDER — HEPARIN (PORCINE) IN NACL 1000-0.9 UT/500ML-% IV SOLN
INTRAVENOUS | Status: DC | PRN
  Administered 2024-02-08 (×3): 500 mL

## 2024-02-08 MED ORDER — SUGAMMADEX SODIUM 200 MG/2ML IV SOLN
INTRAVENOUS | Status: DC | PRN
  Administered 2024-02-08: 300 mg via INTRAVENOUS

## 2024-02-08 MED ORDER — HEPARIN SODIUM (PORCINE) 1000 UNIT/ML IJ SOLN
INTRAMUSCULAR | Status: DC | PRN
Start: 2024-02-08 — End: 2024-02-08
  Administered 2024-02-08: 16000 [IU] via INTRAVENOUS

## 2024-02-08 SURGICAL SUPPLY — 21 items
BAG SNAP BAND KOVER 36X36 (MISCELLANEOUS) IMPLANT
CABLE FARASTAR GEN2 SNGL USE (CABLE) IMPLANT
CATH BI DIR 7FR CS F-J 12 PIN (CATHETERS) IMPLANT
CATH FARAWAVE 2.0 31 (CATHETERS) IMPLANT
CATH GE 8FR SOUNDSTAR (CATHETERS) IMPLANT
CATH OCTARAY 2.0 F 3-3-3-3-3 (CATHETERS) IMPLANT
CLOSURE PERCLOSE PROSTYLE (Vascular Products) IMPLANT
COVER SWIFTLINK CONNECTOR (BAG) ×1 IMPLANT
DEVICE CLOSURE MYNXGRIP 6/7F (Vascular Products) IMPLANT
DILATOR VESSEL 38 20CM 16FR (INTRODUCER) IMPLANT
GUIDEWIRE INQWIRE 1.5J.035X260 (WIRE) IMPLANT
KIT VERSACROSS CNCT FARADRIVE (KITS) IMPLANT
MAT PREVALON FULL STRYKER (MISCELLANEOUS) IMPLANT
PACK EP LF (CUSTOM PROCEDURE TRAY) ×1 IMPLANT
PAD DEFIB RADIO PHYSIO CONN (PAD) ×1 IMPLANT
PATCH CARTO3 (PAD) IMPLANT
SHEATH FARADRIVE STEERABLE (SHEATH) IMPLANT
SHEATH PINNACLE 5F 10CM (SHEATH) IMPLANT
SHEATH PINNACLE 8F 10CM (SHEATH) IMPLANT
SHEATH PINNACLE VASC 9FR (SHEATH) IMPLANT
SHEATH PROBE COVER 6X72 (BAG) IMPLANT

## 2024-02-08 NOTE — Anesthesia Procedure Notes (Addendum)
 Procedure Name: Intubation Date/Time: 02/08/2024 11:03 AM  Performed by: Hedy Jarred, CRNAPre-anesthesia Checklist: Patient identified, Emergency Drugs available, Suction available and Patient being monitored Patient Re-evaluated:Patient Re-evaluated prior to induction Oxygen Delivery Method: Circle System Utilized Preoxygenation: Pre-oxygenation with 100% oxygen Induction Type: IV induction Ventilation: Oral airway inserted - appropriate to patient size and Two handed mask ventilation required Laryngoscope Size: Mac and 4 Grade View: Grade II Tube type: Oral Tube size: 7.5 mm Number of attempts: 1 Airway Equipment and Method: Stylet and Oral airway Placement Confirmation: ETT inserted through vocal cords under direct vision, positive ETCO2 and breath sounds checked- equal and bilateral Secured at: 23 cm Tube secured with: Tape Dental Injury: Teeth and Oropharynx as per pre-operative assessment

## 2024-02-08 NOTE — Progress Notes (Signed)
 Resting - eye closed; resp even, unlabored, responds appropriately to verbal stimuli; bilateral groins intact- no hematoma or ecchymosis to either left groin or right groin. Present at patient's bedside.

## 2024-02-08 NOTE — H&P (Signed)
 Electrophysiology Note:   Date:  02/08/24  ID:  Charles Bryant, DOB 1955-09-18, MRN [id number]   Primary Cardiologist: Dorn Lesches, MD Electrophysiologist: Fonda Kitty, MD       History of Present Illness:   Charles Bryant is a 68 y.o. male with h/o carotid artery disease post endarterectomy in 2008, coronary disease, tobacco abuse, hypertension, hyperlipidemia, COPD, and persistent atrial fibrillation who is being seen today for evaluation for Watchman and ablation.    Discussed the use of AI scribe software for clinical note transcription with the patient, who gave verbal consent to proceed.   History of Present Illness He has a history of atrial fibrillation, initially detected by his iWatch and later confirmed with a twelve-lead ECG. He has been in atrial fibrillation for approximately six months, with the first episode occurring in January. He self-converted from afib once. He has experienced significant adverse reactions to blood thinners. An allergic reaction to Eliquis  resulted in an autoimmune response with thirty percent of his body covered in hives. He is currently on Pradaxa  but suspects it may also be causing a mild reaction. Due to these reactions and concerns about bleeding risks, he is interested in the Watchman procedure to avoid long-term use of blood thinners. He reports some swelling in his legs and has Lasix  available for use if needed. This has developed since he went into AF.    Interval: Patient presents today for planned ablation. Reports feeling relatively well. No new or acute complaints.   Review of systems complete and found to be negative unless listed in HPI.    EP Information / Studies Reviewed:     EKG is not ordered today. EKG from 08/08/23 reviewed which showed AF.       Echo 06/27/23:   1. Left ventricular ejection fraction, by estimation, is 60 to 65%. The  left ventricle has normal function. The left ventricle has no regional  wall motion  abnormalities. Left ventricular diastolic function could not  be evaluated.   2. Right ventricular systolic function is normal. The right ventricular  size is normal.   3. The mitral valve is normal in structure. No evidence of mitral valve  regurgitation. No evidence of mitral stenosis.   4. The aortic valve is normal in structure. Aortic valve regurgitation is  not visualized. No aortic stenosis is present.   5. The inferior vena cava is normal in size with greater than 50%  respiratory variability, suggesting right atrial pressure of 3 mmHg.      Risk Assessment/Calculations:     CHA2DS2-VASc Score = 4   This indicates a 4.8% annual risk of stroke. The patient's score is based upon: CHF History: 1 HTN History: 1 Diabetes History: 0 Stroke History: 0 Vascular Disease History: 1 Age Score: 1 Gender Score: 0               Physical Exam:    Today's Vitals   02/08/24 0909 02/08/24 0910  BP: (!) 150/105 (!) 160/87  Pulse: 86   Resp: 18   Temp: 98.3 F (36.8 C)   TempSrc: Oral   SpO2: 96%   Weight: 99.8 kg   Height: 5' 10 (1.778 m)   PainSc: 0-No pain    Body mass index is 31.57 kg/m.  GEN: Well nourished, well developed in no acute distress NECK: No JVD; No carotid bruits CARDIAC: Normal rate, irregular rhythm RESPIRATORY:  Clear to auscultation without rales, wheezing or rhonchi  ABDOMEN: Soft, non-tender, non-distended  EXTREMITIES:  Trace edema; No deformity    ASSESSMENT AND PLAN:   I have seen Charles Bryant in the office today who is being considered for a Watchman left atrial appendage closure device. I believe they will benefit from this procedure given their history of atrial fibrillation, CHA2DS2-VASc score of 3 and unadjusted ischemic stroke rate of 3.2% per year. Unfortunately, the patient is not felt to be a long term anticoagulation candidate secondary to adverse allergic reactions to multiple oral anti-coagulants and bleeding. The patient's chart has  been reviewed and I feel that they would be a candidate for short term oral anticoagulation after Watchman implant.    It is my belief that after undergoing a LAA closure procedure, Charles Bryant will not need long term anticoagulation which eliminates anticoagulation side effects and major bleeding risk.    Procedural risks for the Watchman implant have been reviewed with the patient including a 0.5% risk of stroke, <1% risk of perforation and <1% risk of device embolization. Other risks include bleeding, vascular damage, tamponade, worsening renal function, and death. The patient understands these risk and wishes to proceed.       The published clinical data on the safety and effectiveness of WATCHMAN include but are not limited to the following: - Holmes DR, Jess BEARD, Sick P et al. for the PROTECT AF Investigators. Percutaneous closure of the left atrial appendage versus warfarin therapy for prevention of stroke in patients with atrial fibrillation: a randomised non-inferiority trial. Lancet 2009; 374: 534-42. GLENWOOD Jess BEARD, Doshi SK, Jonita VEAR Satchel D et al. on behalf of the PROTECT AF Investigators. Percutaneous Left Atrial Appendage Closure for Stroke Prophylaxis in Patients With Atrial Fibrillation 2.3-Year Follow-up of the PROTECT AF (Watchman Left Atrial Appendage System for Embolic Protection in Patients With Atrial Fibrillation) Trial. Circulation 2013; 127:720-729. - Alli O, Doshi S,  Kar S, Reddy VY, Sievert H et al. Quality of Life Assessment in the Randomized PROTECT AF (Percutaneous Closure of the Left Atrial Appendage Versus Warfarin Therapy for Prevention of Stroke in Patients With Atrial Fibrillation) Trial of Patients at Risk for Stroke With Nonvalvular Atrial Fibrillation. J Am Coll Cardiol 2013; 61:1790-8. GLENWOOD Satchel DR, Archer RAMAN, Price M, Whisenant B, Sievert H, Doshi S, Huber K, Reddy V. Prospective randomized evaluation of the Watchman left atrial appendage Device in patients with atrial  fibrillation versus long-term warfarin therapy; the PREVAIL trial. Journal of the [employer] of Cardiology, Vol. 4, No. 1, 2014, 1-11. - Kar S, Doshi SK, Sadhu A, Horton R, Osorio J et al. Primary outcome evaluation of a next-generation left atrial appendage closure device: results from the PINNACLE FLX trial. Circulation 2021;143(18)1754-1762.    HAS-BLED score: 3 Hypertension Yes  Abnormal renal and liver function (Dialysis, transplant, Cr >2.26 mg/dL /Cirrhosis or Bilirubin >2x Normal or AST/ALT/AP >3x Normal) No  Stroke No  Bleeding Yes  Labile INR (Unstable/high INR) No  Elderly (>65) Yes  Drugs or alcohol (>= 8 drinks/week, anti-plt or NSAID) No    CHA2DS2-VASc Score = 4  The patient's score is based upon: CHF History: 1 - NYHA class II HTN History: 1 Diabetes History: 0 Stroke History: 0 Vascular Disease History: 1 Age Score: 1 Gender Score: 0         ASSESSMENT AND PLAN: #. Persistent Atrial Fibrillation: Associated with diastolic heart failure. For this reason, we have prioritized a rhythm control strategy. -Discussed treatment options today for AF including antiarrhythmic drug therapy and ablation. Discussed risks, recovery  and likelihood of success with each treatment strategy. Risk, benefits, and alternatives to EP study and ablation for afib were discussed. These risks include but are not limited to stroke, bleeding, vascular damage, tamponade, perforation, damage to the esophagus, lungs, phrenic nerve and other structures, pulmonary vein stenosis, worsening renal function, coronary vasospasm and death.  Discussed potential need for repeat ablation procedures and antiarrhythmic drugs after an initial ablation. The patient understands these risk and wishes to proceed today. -Continue metoprolol  XL 50mg  daily.   #. Secondary Hypercoagulable State  - Did not tolerate Eliquis . Reports side effects with Pradaxa . Now on warfarin. INR therapeutic. We will try to arrange for  Watchman implant to be done in 8 weeks.    #. Diastolic heart failure: Lower extremity edema. Likely precipitated by AF. -Instructed patient to take Lasix  until edema resolves. Then resume as needed.    Follow up with Dr. Kennyth 3 months after ablation.   Signed, Fonda Kennyth, MD

## 2024-02-08 NOTE — Progress Notes (Signed)
 Patient ambulated to the bathroom and was able to void. No bleeding or hematoma noted. Discharge instructions given.

## 2024-02-08 NOTE — Progress Notes (Signed)
 Verbal order from Dr. Kennyth. Patient to take dose of Metoprolol  now and then take Warfarin when he gets home. After patient ambulates and voids, if  there is no bleeding or hematoma to groin sites patient ok to discharge home. Read back and verified.

## 2024-02-08 NOTE — Discharge Instructions (Signed)

## 2024-02-08 NOTE — Anesthesia Preprocedure Evaluation (Addendum)
 Anesthesia Evaluation  Patient identified by MRN, date of birth, ID band Patient awake    Reviewed: Allergy & Precautions, NPO status , Patient's Chart, lab work & pertinent test results  Airway Mallampati: II  TM Distance: >3 FB Neck ROM: Full    Dental no notable dental hx.    Pulmonary asthma , COPD, former smoker   Pulmonary exam normal        Cardiovascular hypertension, Pt. on medications and Pt. on home beta blockers  Rhythm:Irregular Rate:Normal  ECHO 2025:  1. Left ventricular ejection fraction, by estimation, is 60 to 65%. The  left ventricle has normal function. The left ventricle has no regional  wall motion abnormalities. Left ventricular diastolic function could not  be evaluated.   2. Right ventricular systolic function is normal. The right ventricular  size is normal.   3. The mitral valve is normal in structure. No evidence of mitral valve  regurgitation. No evidence of mitral stenosis.   4. The aortic valve is normal in structure. Aortic valve regurgitation is  not visualized. No aortic stenosis is present.   5. The inferior vena cava is normal in size with greater than 50%  respiratory variability, suggesting right atrial pressure of 3 mmHg.      Neuro/Psych negative neurological ROS  negative psych ROS   GI/Hepatic Neg liver ROS,GERD  Medicated,,  Endo/Other  negative endocrine ROS    Renal/GU negative Renal ROS  negative genitourinary   Musculoskeletal negative musculoskeletal ROS (+)    Abdominal Normal abdominal exam  (+)   Peds  Hematology negative hematology ROS (+)   Anesthesia Other Findings   Reproductive/Obstetrics                              Anesthesia Physical Anesthesia Plan  ASA: 3  Anesthesia Plan: General   Post-op Pain Management:    Induction: Intravenous  PONV Risk Score and Plan: 2 and Ondansetron , Dexamethasone and Treatment may vary  due to age or medical condition  Airway Management Planned: Mask and Oral ETT  Additional Equipment: None  Intra-op Plan:   Post-operative Plan: Extubation in OR  Informed Consent: I have reviewed the patients History and Physical, chart, labs and discussed the procedure including the risks, benefits and alternatives for the proposed anesthesia with the patient or authorized representative who has indicated his/her understanding and acceptance.     Dental advisory given  Plan Discussed with: CRNA  Anesthesia Plan Comments:          Anesthesia Quick Evaluation

## 2024-02-08 NOTE — Anesthesia Postprocedure Evaluation (Signed)
 Anesthesia Post Note  Patient: Charles Bryant  Procedure(s) Performed: ATRIAL FIBRILLATION ABLATION     Patient location during evaluation: PACU Anesthesia Type: General Level of consciousness: awake and alert Pain management: pain level controlled Vital Signs Assessment: post-procedure vital signs reviewed and stable Respiratory status: spontaneous breathing, nonlabored ventilation, respiratory function stable and patient connected to nasal cannula oxygen Cardiovascular status: blood pressure returned to baseline and stable Postop Assessment: no apparent nausea or vomiting Anesthetic complications: no   There were no known notable events for this encounter.  Last Vitals:  Vitals:   02/08/24 1311 02/08/24 1315  BP:  (!) 140/88  Pulse: 85 85  Resp: 14 16  Temp:  36.8 C  SpO2: 97% 98%    Last Pain:  Vitals:   02/08/24 1341  TempSrc:   PainSc: 0-No pain                 Cordella P Shepherd Finnan

## 2024-02-08 NOTE — Progress Notes (Signed)
  Bilateral groins level 0; no hematoma, no ecchymosis . Pt offers  not complaints of pain. Neurologically appropriate with movement and responses. Remain at bedside for 30 min.

## 2024-02-08 NOTE — Transfer of Care (Signed)
 Immediate Anesthesia Transfer of Care Note  Patient: Charles Bryant  Procedure(s) Performed: ATRIAL FIBRILLATION ABLATION  Patient Location: Cath Lab  Anesthesia Type:General  Level of Consciousness: awake, alert , and oriented  Airway & Oxygen Therapy: Patient Spontanous Breathing and Patient connected to nasal cannula oxygen  Post-op Assessment: Report given to RN and Post -op Vital signs reviewed and stable  Post vital signs: Reviewed and stable  Last Vitals:  Vitals Value Taken Time  BP    Temp    Pulse 89 02/08/24 12:55  Resp 19 02/08/24 12:55  SpO2 94 % 02/08/24 12:55  Vitals shown include unfiled device data.  Last Pain:  Vitals:   02/08/24 0909  TempSrc: Oral  PainSc: 0-No pain         Complications: There were no known notable events for this encounter.

## 2024-02-09 ENCOUNTER — Other Ambulatory Visit (HOSPITAL_COMMUNITY): Payer: Self-pay | Admitting: Physician Assistant

## 2024-02-09 ENCOUNTER — Encounter (HOSPITAL_COMMUNITY): Payer: Self-pay | Admitting: Cardiology

## 2024-02-09 ENCOUNTER — Telehealth (HOSPITAL_COMMUNITY): Payer: Self-pay

## 2024-02-09 LAB — POCT ACTIVATED CLOTTING TIME: Activated Clotting Time: 331 s

## 2024-02-09 NOTE — Telephone Encounter (Signed)
 Spoke with patient to complete post procedure follow up call.  Patient reports no complications with groin sites.   Instructions reviewed with patient:  Remove large bandage at puncture site after 24 hours. It is normal to have bruising, tenderness, mild swelling, and a pea or marble sized lump/knot at the groin site which can take up to three months to resolve.  Get help right away if you notice sudden swelling at the puncture site.  Check your puncture site every day for signs of infection: fever, redness, swelling, pus drainage, warmth, foul odor or excessive pain. If this occurs, please call [phone], to speak with the RN Navigator. Get help right away if your puncture site is bleeding and the bleeding does not stop after applying firm pressure to the area.  You may continue to have skipped beats/ atrial fibrillation during the first several months after your procedure.  It is very important not to miss any doses of your blood thinner Warfarin.    You will follow up with the APP 4 weeks after your procedure and follow up with the APP 3 months after your procedure.   Patient verbalized understanding to all instructions provided.

## 2024-02-09 NOTE — Telephone Encounter (Signed)
 Pt's pharmacy is requesting a refill on medication furosemide  20 mg tablet. Dr. Court did not prescribe this medication. Would Dr. Court like to refill this medication? Please address

## 2024-02-15 ENCOUNTER — Ambulatory Visit (INDEPENDENT_AMBULATORY_CARE_PROVIDER_SITE_OTHER)

## 2024-02-15 DIAGNOSIS — Z23 Encounter for immunization: Secondary | ICD-10-CM | POA: Diagnosis not present

## 2024-02-15 NOTE — Progress Notes (Signed)
 Patient is in office today for a nurse visit for Covid and Flu Immunizations. Covid Injection was given in the  Right deltoid. Patient tolerated injection well. Flu injection was given in the left deltoid. Patient tolerated injection well.

## 2024-02-19 ENCOUNTER — Other Ambulatory Visit: Payer: Self-pay | Admitting: Nurse Practitioner

## 2024-02-21 NOTE — Telephone Encounter (Signed)
 Requested Prescriptions  Refused Prescriptions Disp Refills   lisinopril  (ZESTRIL ) 20 MG tablet [Pharmacy Med Name: Lisinopril  20 MG Oral Tablet] 100 tablet 2    Sig: TAKE 1 TABLET BY MOUTH DAILY     Cardiovascular:  ACE Inhibitors Failed - 02/21/2024  3:30 PM      Failed - Last BP in normal range    BP Readings from Last 1 Encounters:  02/08/24 (!) 143/88         Passed - Cr in normal range and within 180 days    Creatinine, Ser  Date Value Ref Range Status  01/10/2024 0.80 0.76 - 1.27 mg/dL Final         Passed - K in normal range and within 180 days    Potassium  Date Value Ref Range Status  01/10/2024 4.8 3.5 - 5.2 mmol/L Final         Passed - Patient is not pregnant      Passed - Valid encounter within last 6 months    Recent Outpatient Visits           3 weeks ago Atrial fibrillation, chronic (HCC)   [place] [facility] Melvin Pao, NP   1 month ago Atrial fibrillation, chronic (HCC)   [place] [facility] Melvin Pao, NP   1 month ago Hypercoagulable state due to persistent atrial fibrillation [facility])   [place] [facility] Melvin Pao, NP   1 month ago Hypercoagulable state due to persistent atrial fibrillation [facility])   [place] [facility] Melvin Pao, NP   2 months ago Atrial fibrillation, chronic [facility])   [place] Vicci Duwaine SQUIBB, DO       Future Appointments             In 2 weeks Riddle, Suzann, NP  [facility] at [place]   In 2 months Riddle, Suzann, NP [facility]

## 2024-02-22 ENCOUNTER — Other Ambulatory Visit: Payer: Self-pay | Admitting: Nurse Practitioner

## 2024-02-24 NOTE — Telephone Encounter (Signed)
 Requested Prescriptions  Pending Prescriptions Disp Refills   amLODipine  (NORVASC ) 10 MG tablet [Pharmacy Med Name: amLODIPine  Besylate 10 MG Oral Tablet] 90 tablet 1    Sig: TAKE 1 TABLET BY MOUTH DAILY     Cardiovascular: Calcium  Channel Blockers 2 Failed - 02/24/2024 12:10 PM      Failed - Last BP in normal range    BP Readings from Last 1 Encounters:  02/08/24 (!) 143/88         Passed - Last Heart Rate in normal range    Pulse Readings from Last 1 Encounters:  02/08/24 80         Passed - Valid encounter within last 6 months    Recent Outpatient Visits           4 weeks ago Atrial fibrillation, chronic (HCC)   [facility] Melvin Pao, NP   1 month ago Atrial fibrillation, chronic [facility])   [place] [facility] Melvin Pao, NP   1 month ago Hypercoagulable state due to persistent atrial fibrillation [facility])   [place] [facility] Melvin Pao, NP   1 month ago Hypercoagulable state due to persistent atrial fibrillation [facility])   [place] [facility] Melvin Pao, NP   2 months ago Atrial fibrillation, chronic [facility])   [place] [facility] Vicci Duwaine SQUIBB, DO       Future Appointments             In 1 week Riddle, Suzann, NP [place] [facility] [place]   In 2 months Riddle, Suzann, NP [facility]

## 2024-03-05 ENCOUNTER — Other Ambulatory Visit: Payer: Self-pay

## 2024-03-05 ENCOUNTER — Telehealth: Payer: Self-pay

## 2024-03-05 DIAGNOSIS — I480 Paroxysmal atrial fibrillation: Secondary | ICD-10-CM

## 2024-03-05 NOTE — Telephone Encounter (Signed)
 Spoke with patient. Offered 11/6 Watchman first case with arrival of 0530. Patient agreeable. Reviewed will get instructions, EKG, and lab work at 03/08/2024 appt with Suzann Riddle, NP.  Called Karen Holdsworth's office as she manages patient's coumadin . Requested he have INR drawn at appt with her Thursday, 10/30 and as well as on Tuesday 11/4.

## 2024-03-05 NOTE — Telephone Encounter (Signed)
 SABRA

## 2024-03-05 NOTE — Telephone Encounter (Deleted)
 Kennyth Chew, MD  Josue Lamarr LABOR, RN  Thanks for reaching out. I would prefer the 11/6 date for Mr. Raybourn, if possible.   Josh

## 2024-03-05 NOTE — Telephone Encounter (Signed)
 No problem.

## 2024-03-05 NOTE — Telephone Encounter (Addendum)
-----   Message from Fonda Kitty sent at 02/20/2024 11:02 AM EDT ----- Regarding: RE: Watchman  Thanks for reaching out. I would prefer the 11/6 date for Mr. Cortright, if possible.   Josh

## 2024-03-05 NOTE — Telephone Encounter (Signed)
 PCP aware.   Copied from CRM [id number]. Topic: Clinical - Request for Lab/Test Order >> Mar 05, 2024  9:27 AM Zy'onna H wrote: Reason for CRM:  RN - Danielle from [place] called in on behalf of this patient to place in a Lab/Test Request for this upcoming appointments:  Requesting:  An INR drawn on his appointment this upcoming Thursday 03/08/2024 with Darice Petty, NP and  Next Tuesday 03/13/2024 with Suzann, Riddle, NP prior to his procedure - Watchman scheduled 03/15/2024  IF you have any questions regarding this order please call Edsel CHRISTELLA Greek, RN [phone]

## 2024-03-07 ENCOUNTER — Encounter: Payer: Self-pay | Admitting: Cardiovascular Disease

## 2024-03-07 ENCOUNTER — Ambulatory Visit: Attending: Cardiology | Admitting: Cardiology

## 2024-03-07 VITALS — BP 130/82 | HR 77 | Ht 70.0 in | Wt 226.0 lb

## 2024-03-07 DIAGNOSIS — D6869 Other thrombophilia: Secondary | ICD-10-CM | POA: Diagnosis not present

## 2024-03-07 DIAGNOSIS — I4819 Other persistent atrial fibrillation: Secondary | ICD-10-CM

## 2024-03-07 NOTE — Progress Notes (Signed)
 Electrophysiology Clinic Note    Date:  03/07/2024  Patient ID:  Charles Bryant, Charles Bryant 1955-10-28, MRN [id number] PCP:  Melvin Pao, NP  Cardiologist:  Dorn Lesches, MD  Electrophysiologist:  Fonda Kitty, MD    Discussed the use of AI scribe software for clinical note transcription with the patient, who gave verbal consent to proceed.   Patient Profile    Chief Complaint: AF ablation follow-up  History of Present Illness: Charles Bryant is a 68 y.o. male with PMH notable for persis AFib, carotid artery disease s/p endarterectomy (2008), CAD, HTN, HLD, COPD, tobacco use; seen today for Fonda Kitty, MD for routine electrophysiology follow-up s/p Ablation and pre-op evaluation prior to LAAO.   He is s/p AF ablation w isolation of pulm veins on 10/1 by Dr. Kitty.   He is planned for LAAO w Watchman on 11/6.   On follow-up today, he has not had any AF symptoms since ablation. Historically is asymptomatic, was diagnosed by watch. Watch, too, has not identified any AFib episodes since ablation. He continues to take warfarin along with prednisone  daily. He had similar hives with warfarin as he did with xarelto  and eliquis .  He has no complaints regarding his groin access sites, but has not eval'd them recently.   He takes lasix  as needed based on lower extremity edema. He plans to take lasix  when he gets home.   He denies chest pain, chest pressure, palpitations, SOB. He has had increased appetite recently.     Arrhythmia/Device History No specialty comments available.    ROS:  Please see the history of present illness. All other systems are reviewed and otherwise negative.    Physical Exam    VS:  BP 130/82 (BP Location: Left Arm, Patient Position: Sitting, Cuff Size: Large)   Pulse 77   Ht 5' 10 (1.778 m)   Wt 226 lb (102.5 kg)   SpO2 96%   BMI 32.43 kg/m  BMI: Body mass index is 32.43 kg/m.           Wt Readings from Last 3 Encounters:  03/07/24 226 lb  (102.5 kg)  02/08/24 220 lb (99.8 kg)  01/26/24 220 lb 12.8 oz (100.2 kg)     GEN- The patient is well appearing, alert and oriented x 3 today.   Lungs- Clear to ausculation bilaterally, normal work of breathing.  Heart- Irregularly irregular rate and rhythm, no murmurs, rubs or gallops Extremities- 1-2+ R>L peripheral edema, warm, dry   Studies Reviewed   Previous EP, cardiology notes.    EKG is ordered. Personal review of EKG from today shows:    EKG Interpretation Date/Time:  Wednesday March 07 2024 14:04:05 EDT Ventricular Rate:  77 PR Interval:  186 QRS Duration:  84 QT Interval:  354 QTC Calculation: 400 R Axis:   20  Text Interpretation: Sinus rhythm with with frequent Premature supraventricular complexes Confirmed by Jehiel Koepp [phone]) on 03/07/2024 3:12:50 PM    Cardiac CT, 11/25/2023 1. There is normal pulmonary vein drainage into the left atrium with ostial measurements above.  2. There is no thrombus in the left atrial appendage.  3. The esophagus runs in close proximity to the ostium of the right lower pulmonary vein.  4. No PFO/ASD.  5. Normal coronary origin. Right dominance.  6. CAC score of 452 Agatston units which is 71st percentile for age-, race-, and sex-matched controls. This suggests intermediate risk for future cardiac events.  7.  Mildly dilated aortic  root, 43 mm.  TTE, 06/27/2023  1. Left ventricular ejection fraction, by estimation, is 60 to 65%. The left ventricle has normal function. The left ventricle has no regional wall motion abnormalities. Left ventricular diastolic function could not be evaluated.   2. Right ventricular systolic function is normal. The right ventricular size is normal.   3. The mitral valve is normal in structure. No evidence of mitral valve regurgitation. No evidence of mitral stenosis.   4. The aortic valve is normal in structure. Aortic valve regurgitation is not visualized. No aortic stenosis is present.   5. The  inferior vena cava is normal in size with greater than 50% respiratory variability, suggesting right atrial pressure of 3 mmHg.     Assessment and Plan     #) persis AFib #) PAC S/p AF ablation 02/08/2024 by Dr. Kennyth No recurrence of Afib by watch, but having frequent PACs on today's EKG Will update Dr. Kennyth Continue 50mg  toprol  daily   #) Hypercoag d/t persis afib CHA2DS2-VASc Score = at least 4 [CHF History: 1, HTN History: 1, Diabetes History: 0, Stroke History: 0, Vascular Disease History: 1, Age Score: 1, Gender Score: 0].  Therefore, the patient's annual risk of stroke is 4.8 %.    Stroke ppx - warfarin,  appropriately dosed Previously intolerant to eliquis  and xarelto  with hives, hives currently managed with daily prednisone  Planned for LAAO with Watchman Update BMP, CBC today Pre-procedure letter provided, reviewed with patient and wife  #) lower extremity edema Managed with PRN lasix  Increased edema today on exam, recommended patient to take lasix  when he returns home      Current medicines are reviewed at length with the patient today.   The patient does not have concerns regarding his medicines.  The following changes were made today:  none  Labs/ tests ordered today include:  Orders Placed This Encounter  Procedures   Basic metabolic panel with GFR   CBC   Magnesium   EKG 12-Lead     Disposition: Follow up with Dr. Kennyth or EP APP as usual post procedure   Signed, Chantal Needle, NP  03/07/24  3:15 PM  Electrophysiology [facility]

## 2024-03-07 NOTE — Patient Instructions (Signed)
 Medication Instructions:  Your physician recommends that you continue on your current medications as directed. Please refer to the Current Medication list given to you today.   *If you need a refill on your cardiac medications before your next appointment, please call your pharmacy*  Lab Work: - CBC, BMP, Mag today If you have labs (blood work) drawn today and your tests are completely normal, you will receive your results only by: MyChart Message (if you have MyChart) OR A paper copy in the mail If you have any lab test that is abnormal or we need to change your treatment, we will call you to review the results.  Testing/Procedures: No test ordered today   Follow-Up: At [facility], you and your health needs are our priority.  As part of our continuing mission to provide you with exceptional heart care, our providers are all part of one team.  This team includes your primary Cardiologist (physician) and Advanced Practice Providers or APPs (Physician Assistants and Nurse Practitioners) who all work together to provide you with the care you need, when you need it.  Your next appointment:   After watchmann procedure

## 2024-03-08 ENCOUNTER — Other Ambulatory Visit: Payer: Self-pay

## 2024-03-08 ENCOUNTER — Ambulatory Visit: Payer: Self-pay | Admitting: Nurse Practitioner

## 2024-03-08 ENCOUNTER — Ambulatory Visit: Payer: Self-pay | Admitting: Cardiology

## 2024-03-08 ENCOUNTER — Encounter: Payer: Self-pay | Admitting: Nurse Practitioner

## 2024-03-08 ENCOUNTER — Ambulatory Visit (INDEPENDENT_AMBULATORY_CARE_PROVIDER_SITE_OTHER): Admitting: Nurse Practitioner

## 2024-03-08 VITALS — BP 125/62 | HR 69 | Temp 99.6°F | Ht 70.0 in | Wt 223.4 lb

## 2024-03-08 DIAGNOSIS — I10 Essential (primary) hypertension: Secondary | ICD-10-CM

## 2024-03-08 DIAGNOSIS — I482 Chronic atrial fibrillation, unspecified: Secondary | ICD-10-CM | POA: Diagnosis not present

## 2024-03-08 DIAGNOSIS — J449 Chronic obstructive pulmonary disease, unspecified: Secondary | ICD-10-CM | POA: Diagnosis not present

## 2024-03-08 LAB — COAGUCHEK XS/INR WAIVED
INR: 2.2 — ABNORMAL HIGH (ref 0.9–1.1)
Prothrombin Time: 26.5 s

## 2024-03-08 LAB — BASIC METABOLIC PANEL WITH GFR
BUN/Creatinine Ratio: 14 (ref 10–24)
BUN: 13 mg/dL (ref 8–27)
CO2: 25 mmol/L (ref 20–29)
Calcium: 9.7 mg/dL (ref 8.6–10.2)
Chloride: 99 mmol/L (ref 96–106)
Creatinine, Ser: 0.91 mg/dL (ref 0.76–1.27)
Glucose: 127 mg/dL — ABNORMAL HIGH (ref 70–99)
Potassium: 4.6 mmol/L (ref 3.5–5.2)
Sodium: 141 mmol/L (ref 134–144)
eGFR: 91 mL/min/1.73 (ref >59)

## 2024-03-08 LAB — CBC
Hematocrit: 40.8 % (ref 37.5–51.0)
Hemoglobin: 13.4 g/dL (ref 13.0–17.7)
MCH: 29.9 pg (ref 26.6–33.0)
MCHC: 32.8 g/dL (ref 31.5–35.7)
MCV: 91 fL (ref 79–97)
Platelets: 180 x10E3/uL (ref 150–450)
RBC: 4.48 x10E6/uL (ref 4.14–5.80)
RDW: 14.9 % (ref 11.6–15.4)
WBC: 9 x10E3/uL (ref 3.4–10.8)

## 2024-03-08 LAB — MAGNESIUM: Magnesium: 2.1 mg/dL (ref 1.6–2.3)

## 2024-03-08 NOTE — Assessment & Plan Note (Signed)
 Chronic.  Controlled.  Continue with current medication regimen of Amlodipine  and HCTZ.  On Metoprolol  for PACs.  BP was low when on Quinapril .  Will continue to hold.  Reviewed labs from Cardiology.  Reviewed recent Cardiology notes.  Return to clinic in 6 months for reevaluation.  Call sooner if concerns arise.

## 2024-03-08 NOTE — Telephone Encounter (Signed)
 After further review, made new plan for patient. He will have pre-procedure visit with Suzann on 05/17/2024. At that visit, he will STOP COUMADIN  and START DAPT in preparation for LAAO on 06/14/2024. The patient has been notified of plan.

## 2024-03-08 NOTE — Assessment & Plan Note (Signed)
Chronic.  Controlled.  Continue with current medication regimen wixela and PRN albuterol.  Has 30 pack history of smoking.   Labs ordered today.  Return to clinic in 6 months for reevaluation.  Call sooner if concerns arise.

## 2024-03-08 NOTE — Telephone Encounter (Signed)
 Per Dr. Kennyth, patient cannot be on prednisone  for LAAO.  Patient will continue prednisone  and coumadin  until he is seen by Suzann Riddle 05/17/2024. At that time, he will be switched to DAPT. LAAO rescheduled to 06/14/2024. Notified patient and wife of these changes.   Will send a message to Darice Petty as patient is to see her today. He will not need additional INR check from Cardiology. Coumadin  management will continue by Darice Petty.

## 2024-03-08 NOTE — Assessment & Plan Note (Signed)
 Chronic.  Rate controlled at visit today.  Has been on Coumadin  and Prednisone  due to adverse reaction to other DOAC. Will need to come off prednisone  before the watchman.  Will wean prednisone  and change to Plavix in January leading up to [facility] in February.  INR today was 2.2.  Continue with current regimen.  Follow up in 1 month.

## 2024-03-08 NOTE — Progress Notes (Signed)
 BP 125/62   Pulse 69   Temp 99.6 F (37.6 C) (Oral)   Ht 5' 10 (1.778 m)   Wt 223 lb 6.4 oz (101.3 kg)   SpO2 98%   BMI 32.05 kg/m    Subjective:    Patient ID: Charles Bryant, male    DOB: 12-14-1955, 68 y.o.   MRN: [id number]  HPI: Charles Bryant is a 68 y.o. male  Chief Complaint  Patient presents with   Hypertension   GERD  He is taking Omeprazole  40 MG daily  GERD control status: controlled Satisfied with current treatment? yes Heartburn frequency: None Medication side effects: no  Medication compliance: better Dysphagia: no Odynophagia:  no Hematemesis: no Blood in stool: no EGD: Cologuard negative 2022   HYPERTENSION / HYPERLIPIDEMIA Taking Amlodipine  10 MG, Hydrochlorathiazide 12.5MG  and metoprolol  50MG . He is also taking Atorvastatin  40MG . Had an ablation on 02/08/24.  He is planning to have a watchman in February.  Will need to wean down prednisone  and start Plavix in January.   Satisfied with current treatment? Yes Duration of hypertension: years BP monitoring frequency:no BP range: 130/60-80 BP medication side effects: no Past BP meds:  amlodipine , HCTZ, Metoprolol  Duration of hyperlipidemia: years Cholesterol medication side effects: no Cholesterol supplements: none Past cholesterol medications: atorvastain (lipitor) Medication compliance: excellent compliance Aspirin: yes Recent stressors: no Recurrent headaches: no Visual changes: no Palpitations: no Dyspnea: no Chest pain: no Lower extremity edema: no Dizzy/lightheaded: no  Diet: Admits to daily intake of fruits, vegetables, protein, and carbohydrates, states it could be better.  Water: 18 ounces daily  Physical activity: Not as active as he used to be   COPD/ASTHMA Using Fluticasone -salmeterol inhaler BID and has Albuterol  rescue as needed COPD status: controlled, using albuterol   Satisfied with current treatment?: yes Oxygen use: no Dyspnea frequency: No Cough frequency: No Rescue  inhaler frequency: rarely, seasonal Limitation of activity: no Productive cough: No Pneumovax: Up to Date Influenza: Up to Date   Relevant past medical, surgical, family and social history reviewed and updated as indicated. Interim medical history since our last visit reviewed. Allergies and medications reviewed and updated.  Review of Systems  Eyes:  Negative for visual disturbance.  Respiratory:  Negative for shortness of breath.   Cardiovascular:  Negative for chest pain and leg swelling.  Gastrointestinal:  Positive for abdominal distention.  Neurological:  Negative for light-headedness and headaches.    Per HPI unless specifically indicated above     Objective:    BP 125/62   Pulse 69   Temp 99.6 F (37.6 C) (Oral)   Ht 5' 10 (1.778 m)   Wt 223 lb 6.4 oz (101.3 kg)   SpO2 98%   BMI 32.05 kg/m   Wt Readings from Last 3 Encounters:  03/08/24 223 lb 6.4 oz (101.3 kg)  03/07/24 226 lb (102.5 kg)  02/08/24 220 lb (99.8 kg)    Physical Exam Vitals and nursing note reviewed.  Constitutional:      General: He is not in acute distress.    Appearance: Normal appearance. He is not ill-appearing, toxic-appearing or diaphoretic.  HENT:     Head: Normocephalic.     Right Ear: External ear normal.     Left Ear: External ear normal.     Nose: Nose normal. No congestion or rhinorrhea.     Mouth/Throat:     Mouth: Mucous membranes are moist.  Eyes:     General:        Right  eye: No discharge.        Left eye: No discharge.     Extraocular Movements: Extraocular movements intact.     Conjunctiva/sclera: Conjunctivae normal.     Pupils: Pupils are equal, round, and reactive to light.  Cardiovascular:     Rate and Rhythm: Normal rate and regular rhythm.     Heart sounds: No murmur heard. Pulmonary:     Effort: Pulmonary effort is normal. No respiratory distress.     Breath sounds: Normal breath sounds. No wheezing, rhonchi or rales.  Abdominal:     General: Abdomen is  flat. Bowel sounds are normal.  Musculoskeletal:     Cervical back: Normal range of motion and neck supple.  Skin:    General: Skin is warm and dry.     Capillary Refill: Capillary refill takes less than 2 seconds.  Neurological:     General: No focal deficit present.     Mental Status: He is alert and oriented to person, place, and time.  Psychiatric:        Mood and Affect: Mood normal.        Behavior: Behavior normal.        Thought Content: Thought content normal.        Judgment: Judgment normal.     Results for orders placed or performed in visit on 03/08/24  CoaguChek XS/INR Waived (STAT)   Collection Time: 03/08/24  1:17 PM  Result Value Ref Range   INR 2.2 (H) 0.9 - 1.1   Prothrombin Time 26.5 sec      Assessment & Plan:   Problem List Items Addressed This Visit       Cardiovascular and Mediastinum   Essential hypertension   Chronic.  Controlled.  Continue with current medication regimen of Amlodipine  and HCTZ.  On Metoprolol  for PACs.  BP was low when on Quinapril .  Will continue to hold.  Reviewed labs from Cardiology.  Reviewed recent Cardiology notes.  Return to clinic in 6 months for reevaluation.  Call sooner if concerns arise.        Atrial fibrillation, chronic (HCC) - Primary   Chronic.  Rate controlled at visit today.  Has been on Coumadin  and Prednisone  due to adverse reaction to other DOAC. Will need to come off prednisone  before the watchman.  Will wean prednisone  and change to Plavix in January leading up to [facility] in February.  INR today was 2.2.  Continue with current regimen.  Follow up in 1 month.       Relevant Orders   CoaguChek XS/INR Waived (STAT) (Completed)     Respiratory   COPD (chronic obstructive pulmonary disease) (HCC)   Chronic.  Controlled.  Continue with current medication regimen wixela and PRN albuterol .  Has 30 pack history of smoking.   Labs ordered today.  Return to clinic in 6 months for reevaluation.  Call sooner if  concerns arise.          Follow up plan: Return in about 1 month (around 04/08/2024) for Coag check.

## 2024-03-09 NOTE — Telephone Encounter (Signed)
 Pt of Dr. Court. Does Dr. Court want to refill this Non-Cardiac RX? Please advise.

## 2024-03-13 ENCOUNTER — Other Ambulatory Visit: Payer: Self-pay | Admitting: Cardiovascular Disease

## 2024-03-13 MED ORDER — PREDNISONE 10 MG PO TABS: 10.0000 mg | ORAL_TABLET | Freq: Every day | ORAL | 0 refills | Status: DC

## 2024-03-20 ENCOUNTER — Telehealth: Payer: Self-pay | Admitting: Nurse Practitioner

## 2024-03-20 NOTE — Telephone Encounter (Signed)
 [facility] Request RX refill for  omeprazole  (PRILOSEC) 40 MG capsule 40 mg, Daily

## 2024-03-20 NOTE — Telephone Encounter (Signed)
 Medication is not due for refill. Sent in in August.

## 2024-03-22 LAB — FECAL OCCULT BLOOD, IMMUNOCHEMICAL: IFOBT: NEGATIVE

## 2024-03-23 ENCOUNTER — Other Ambulatory Visit: Payer: Self-pay | Admitting: Nurse Practitioner

## 2024-03-26 MED ORDER — OMEPRAZOLE 40 MG PO CPDR: 40.0000 mg | DELAYED_RELEASE_CAPSULE | Freq: Every day | ORAL | 1 refills | Status: AC

## 2024-03-26 NOTE — Telephone Encounter (Signed)
 Looks like you sent is already. Did you still need me to put in a refill request?

## 2024-03-27 ENCOUNTER — Other Ambulatory Visit: Payer: Self-pay | Admitting: Cardiovascular Disease

## 2024-03-29 ENCOUNTER — Other Ambulatory Visit: Payer: Self-pay | Admitting: Cardiovascular Disease

## 2024-03-29 MED ORDER — PREDNISONE 10 MG PO TABS: 10.0000 mg | ORAL_TABLET | Freq: Every day | ORAL | 1 refills | Status: DC

## 2024-03-29 NOTE — Telephone Encounter (Signed)
 Pt of Dr. Court. Does Dr. Court want to refill Prednisone ? Please advise.

## 2024-04-09 ENCOUNTER — Ambulatory Visit (INDEPENDENT_AMBULATORY_CARE_PROVIDER_SITE_OTHER): Admitting: Nurse Practitioner

## 2024-04-09 ENCOUNTER — Encounter: Payer: Self-pay | Admitting: Nurse Practitioner

## 2024-04-09 VITALS — BP 131/69 | HR 90 | Temp 97.6°F | Ht 70.0 in | Wt 230.0 lb

## 2024-04-09 DIAGNOSIS — I4819 Other persistent atrial fibrillation: Secondary | ICD-10-CM | POA: Diagnosis not present

## 2024-04-09 DIAGNOSIS — D6869 Other thrombophilia: Secondary | ICD-10-CM

## 2024-04-09 NOTE — Assessment & Plan Note (Signed)
 Chronic.  Currently on Coumadin  and having allergic reaction.  INR in office was 2.7 today. Continue with current dose.  Decrease amount of green veggies in diet.  He has been eating a lot of them.  Follow up in 1 month.  Will transition to Plavix and ASA in January to prep for Watchman.

## 2024-04-09 NOTE — Progress Notes (Signed)
 BP 131/69 (BP Location: Right Arm, Patient Position: Sitting)   Pulse 90   Temp 97.6 F (36.4 C) (Oral)   Ht 5' 10 (1.778 m)   Wt 230 lb (104.3 kg)   SpO2 98%   BMI 33.00 kg/m    Subjective:    Patient ID: Charles Bryant, male    DOB: [birth year], 68 y.o.   MRN: [id number]  CC: Coumadin  management  HPI: This patient is a 68 y.o. male who presents for coumadin  management. The expected duration of coumadin  treatment is lifelong The reason for anticoagulation is  A. Fib.  Present Coumadin  dose: 2.5mg  daily INR in office was 2.7 today.  He will change to Plavix and ASA in January.  He will need to be off the Prednisone  to do the watchman.  Goal: 2.0-3.0  Excessive bruising: no Nose bleeding: no Rectal bleeding: no Prolonged menstrual cycles: N/A Eating diet with consistent amounts of foods containing Vitamin K:yes Any recent antibiotic use? no  Relevant past medical, surgical, family and social history reviewed and updated as indicated. Interim medical history since our last visit reviewed. Allergies and medications reviewed and updated.  Review of Systems  Constitutional: Negative.   Respiratory: Negative.    Cardiovascular: Negative.   Musculoskeletal: Negative.   Neurological: Negative.   Psychiatric/Behavioral: Negative.          Objective:    BP 131/69 (BP Location: Right Arm, Patient Position: Sitting)   Pulse 90   Temp 97.6 F (36.4 C) (Oral)   Ht 5' 10 (1.778 m)   Wt 230 lb (104.3 kg)   SpO2 98%   BMI 33.00 kg/m   Wt Readings from Last 3 Encounters:  04/09/24 230 lb (104.3 kg)  03/08/24 223 lb 6.4 oz (101.3 kg)  03/07/24 226 lb (102.5 kg)     Physical Exam Vitals and nursing note reviewed.  Constitutional:      General: He is not in acute distress.    Appearance: Normal appearance. He is not ill-appearing, toxic-appearing or diaphoretic.  HENT:     Head: Normocephalic and atraumatic.     Right Ear: External ear normal.     Left Ear: External ear  normal.     Nose: Nose normal.     Mouth/Throat:     Mouth: Mucous membranes are moist.     Pharynx: Oropharynx is clear.  Eyes:     General: No scleral icterus.       Right eye: No discharge.        Left eye: No discharge.     Extraocular Movements: Extraocular movements intact.     Conjunctiva/sclera: Conjunctivae normal.     Pupils: Pupils are equal, round, and reactive to light.  Cardiovascular:     Rate and Rhythm: Normal rate and regular rhythm.     Pulses: Normal pulses.     Heart sounds: Normal heart sounds. No murmur heard.    No friction rub. No gallop.  Pulmonary:     Effort: Pulmonary effort is normal. No respiratory distress.     Breath sounds: Normal breath sounds. No stridor. No wheezing, rhonchi or rales.  Chest:     Chest wall: No tenderness.  Musculoskeletal:        General: Normal range of motion.     Cervical back: Normal range of motion and neck supple.  Skin:    General: Skin is warm and dry.     Capillary Refill: Capillary refill takes less than 2 seconds.  Coloration: Skin is not jaundiced or pale.     Findings: No bruising, erythema, lesion or rash.  Neurological:     General: No focal deficit present.     Mental Status: He is alert and oriented to person, place, and time. Mental status is at baseline.  Psychiatric:        Mood and Affect: Mood normal.        Behavior: Behavior normal.        Thought Content: Thought content normal.        Judgment: Judgment normal.     Last CBC:  Lab Results  Component Value Date   WBC 9.0 03/07/2024   HGB 13.4 03/07/2024   HCT 40.8 03/07/2024   MCV 91 03/07/2024   PLT 180 03/07/2024    Results for orders placed or performed in visit on 03/08/24  CoaguChek XS/INR Waived (STAT)   Collection Time: 03/08/24  1:17 PM  Result Value Ref Range   INR 2.2 (H) 0.9 - 1.1   Prothrombin Time 26.5 sec       Assessment:   Problem List Items Addressed This Visit       Cardiovascular and Mediastinum    Hypercoagulable state due to persistent atrial fibrillation (HCC) - Primary   Chronic.  Currently on Coumadin  and having allergic reaction.  INR in office was 2.7 today. Continue with current dose.  Decrease amount of green veggies in diet.  He has been eating a lot of them.  Follow up in 1 month.  Will transition to Plavix and ASA in January to prep for Watchman.        Relevant Orders   CoaguChek XS/INR Waived (STAT)

## 2024-04-10 ENCOUNTER — Ambulatory Visit: Payer: Self-pay | Admitting: Nurse Practitioner

## 2024-04-10 ENCOUNTER — Encounter: Payer: Self-pay | Admitting: Nurse Practitioner

## 2024-04-10 LAB — COAGUCHEK XS/INR WAIVED
INR: 2.7 — ABNORMAL HIGH (ref 0.9–1.1)
Prothrombin Time: 31.9 s

## 2024-04-28 ENCOUNTER — Other Ambulatory Visit: Payer: Self-pay | Admitting: Nurse Practitioner

## 2024-05-08 ENCOUNTER — Ambulatory Visit: Admitting: Cardiology

## 2024-05-13 ENCOUNTER — Other Ambulatory Visit: Payer: Self-pay | Admitting: Nurse Practitioner

## 2024-05-14 ENCOUNTER — Encounter: Payer: Self-pay | Admitting: Nurse Practitioner

## 2024-05-14 ENCOUNTER — Ambulatory Visit: Admitting: Nurse Practitioner

## 2024-05-14 ENCOUNTER — Ambulatory Visit: Payer: Self-pay | Admitting: Nurse Practitioner

## 2024-05-14 VITALS — BP 125/77 | HR 93 | Temp 97.6°F | Ht 70.0 in | Wt 231.2 lb

## 2024-05-14 DIAGNOSIS — D6869 Other thrombophilia: Secondary | ICD-10-CM

## 2024-05-14 DIAGNOSIS — I4819 Other persistent atrial fibrillation: Secondary | ICD-10-CM | POA: Diagnosis not present

## 2024-05-14 LAB — COAGUCHEK XS/INR WAIVED
INR: 2.4 — ABNORMAL HIGH (ref 0.9–1.1)
Prothrombin Time: 28.3 s

## 2024-05-14 NOTE — Assessment & Plan Note (Signed)
 Chronic.  Currently on Coumadin  and having allergic reaction.  INR in office was 2.4 today. Continue with current dose.  Decrease amount of green veggies in diet.  He has been eating a lot of them.  Follow up in 1 month.  Will transition to Plavix and ASA in January to prep for Watchman.

## 2024-05-14 NOTE — Progress Notes (Signed)
 "  BP 125/77 (BP Location: Right Arm, Patient Position: Sitting, Cuff Size: Large)   Pulse 93   Temp 97.6 F (36.4 C) (Oral)   Ht 5' 10 (1.778 m)   Wt 231 lb 3.2 oz (104.9 kg)   SpO2 96%   BMI 33.17 kg/m    Subjective:    Patient ID: Charles Bryant, male    DOB: 06-Sep-1955, 69 y.o.   MRN: [id number]  CC: Coumadin  management  HPI: This patient is a 69 y.o. male who presents for coumadin  management. The expected duration of coumadin  treatment is lifelong The reason for anticoagulation is  A. Fib.  Present Coumadin  dose: 2.5mg  daily INR in office was 2.4 today.  He will change to Plavix and ASA in January.  His next appt is next week.  He will need to be off the Prednisone  to do the watchman.  Goal: 2.0-3.0  Excessive bruising: no Nose bleeding: no Rectal bleeding: no Prolonged menstrual cycles: N/A Eating diet with consistent amounts of foods containing Vitamin K:yes Any recent antibiotic use? no  Relevant past medical, surgical, family and social history reviewed and updated as indicated. Interim medical history since our last visit reviewed. Allergies and medications reviewed and updated.  Review of Systems  Constitutional: Negative.   Respiratory: Negative.    Cardiovascular: Negative.   Musculoskeletal: Negative.   Neurological: Negative.   Psychiatric/Behavioral: Negative.          Objective:    BP 125/77 (BP Location: Right Arm, Patient Position: Sitting, Cuff Size: Large)   Pulse 93   Temp 97.6 F (36.4 C) (Oral)   Ht 5' 10 (1.778 m)   Wt 231 lb 3.2 oz (104.9 kg)   SpO2 96%   BMI 33.17 kg/m   Wt Readings from Last 3 Encounters:  05/14/24 231 lb 3.2 oz (104.9 kg)  04/09/24 230 lb (104.3 kg)  03/08/24 223 lb 6.4 oz (101.3 kg)     Physical Exam Vitals and nursing note reviewed.  Constitutional:      General: He is not in acute distress.    Appearance: Normal appearance. He is not ill-appearing, toxic-appearing or diaphoretic.  HENT:     Head:  Normocephalic and atraumatic.     Right Ear: External ear normal.     Left Ear: External ear normal.     Nose: Nose normal.     Mouth/Throat:     Mouth: Mucous membranes are moist.     Pharynx: Oropharynx is clear.  Eyes:     General: No scleral icterus.       Right eye: No discharge.        Left eye: No discharge.     Extraocular Movements: Extraocular movements intact.     Conjunctiva/sclera: Conjunctivae normal.     Pupils: Pupils are equal, round, and reactive to light.  Cardiovascular:     Rate and Rhythm: Normal rate and regular rhythm.     Pulses: Normal pulses.     Heart sounds: Normal heart sounds. No murmur heard.    No friction rub. No gallop.  Pulmonary:     Effort: Pulmonary effort is normal. No respiratory distress.     Breath sounds: Normal breath sounds. No stridor. No wheezing, rhonchi or rales.  Chest:     Chest wall: No tenderness.  Musculoskeletal:        General: Normal range of motion.     Cervical back: Normal range of motion and neck supple.  Skin:  General: Skin is warm and dry.     Capillary Refill: Capillary refill takes less than 2 seconds.     Coloration: Skin is not jaundiced or pale.     Findings: No bruising, erythema, lesion or rash.  Neurological:     General: No focal deficit present.     Mental Status: He is alert and oriented to person, place, and time. Mental status is at baseline.  Psychiatric:        Mood and Affect: Mood normal.        Behavior: Behavior normal.        Thought Content: Thought content normal.        Judgment: Judgment normal.     Last CBC:  Lab Results  Component Value Date   WBC 9.0 03/07/2024   HGB 13.4 03/07/2024   HCT 40.8 03/07/2024   MCV 91 03/07/2024   PLT 180 03/07/2024    Results for orders placed or performed in visit on 04/10/24  Fecal occult blood, imunochemical   Collection Time: 03/22/24 12:00 AM  Result Value Ref Range   IFOBT Negative        Assessment:   Problem List Items  Addressed This Visit       Cardiovascular and Mediastinum   Hypercoagulable state due to persistent atrial fibrillation (HCC) - Primary         "

## 2024-05-15 NOTE — Telephone Encounter (Signed)
 Requested Prescriptions  Pending Prescriptions Disp Refills   famotidine  (PEPCID ) 20 MG tablet [Pharmacy Med Name: FAMOTIDINE  20 MG TABLET] 180 tablet 0    Sig: TAKE 1 TABLET BY MOUTH TWICE A DAY     Gastroenterology:  H2 Antagonists Passed - 05/15/2024  9:20 AM      Passed - Valid encounter within last 12 months    Recent Outpatient Visits           Yesterday Hypercoagulable state due to persistent atrial fibrillation [facility])   [place] [facility] Melvin Pao, NP   1 month ago Hypercoagulable state due to persistent atrial fibrillation [facility])   [place] [facility] Melvin Pao, NP   2 months ago Atrial fibrillation, chronic [facility])   [place] [facility] Melvin Pao, NP   3 months ago Atrial fibrillation, chronic [facility])   [place] [facility] Melvin Pao, NP   4 months ago Atrial fibrillation, chronic [facility])   [place] [facility] Melvin Pao, NP       Future Appointments             In 2 days Riddle, Suzann, NP  [facility] at [place]   In 2 months Riddle, Suzann, NP [facility]

## 2024-05-16 NOTE — Progress Notes (Signed)
 "     Electrophysiology Clinic Note    Date:  05/17/2024  Patient ID:  Charles, Bryant 1955-06-12, MRN [id number] PCP:  Melvin Pao, NP  Cardiologist:  Dorn Lesches, MD  Electrophysiologist:  Fonda Kitty, MD  Electrophysiology APP:  Arletha Marschke, NP    Discussed the use of AI scribe software for clinical note transcription with the patient, who gave verbal consent to proceed.   Patient Profile    Chief Complaint: LAAO pre-op  History of Present Illness: Charles Bryant is a 69 y.o. male with PMH notable for persis AFib, carotid artery disease s/p endarterectomy (2008), CAD, HTN, HLD, COPD, tobacco use; seen today for Fonda Kitty, MD for routine electrophysiology follow-up s/p Ablation and pre-op evaluation prior to LAAO.   He is s/p AF ablation w isolation of pulm veins on 10/1 by Dr. Kitty.   He was planned for LAAO w Watchman on 11/6, but this was delayed d/t daily prednisone  use.   On follow-up today he is not aware of any further atrial fibrillation episodes.  He continues to take warfarin nightly with ongoing bruising and skin concerns.  He denies chest pain, chest pressure, palpitations.  He has had some increased lower extremity edema over the past couple weeks requiring daily administration of his as needed Lasix .  His wife is hopeful that prednisone  is that fluid status will improve.     Arrhythmia/Device History No specialty comments available.    ROS:  Please see the history of present illness. All other systems are reviewed and otherwise negative.    Physical Exam    VS:  BP (!) 100/58 (BP Location: Left Arm, Patient Position: Sitting, Cuff Size: Normal)   Pulse 90   Ht 5' 10 (1.778 m)   Wt 233 lb 9.6 oz (106 kg)   SpO2 97%   BMI 33.52 kg/m  BMI: Body mass index is 33.52 kg/m.           Wt Readings from Last 3 Encounters:  05/17/24 233 lb 9.6 oz (106 kg)  05/14/24 231 lb 3.2 oz (104.9 kg)  04/09/24 230 lb (104.3 kg)     GEN- The patient  is well appearing, alert and oriented x 3 today.   Lungs- Clear to ausculation bilaterally, normal work of breathing.  Heart- Regular rate and rhythm, no murmurs, rubs or gallops Extremities- 1-2+ peripheral edema, warm, dry   Studies Reviewed   Previous EP, cardiology notes.    EKG is ordered. Personal review of EKG from today shows:    EKG Interpretation Date/Time:  Thursday May 17 2024 10:26:27 EST Ventricular Rate:  90 PR Interval:  172 QRS Duration:  88 QT Interval:  342 QTC Calculation: 418 R Axis:   44  Text Interpretation: Normal sinus rhythm with sinus arrhythmia Normal ECG Confirmed by Christel Bai [phone]) on 05/17/2024 10:41:19 AM    Cardiac CT, 11/25/2023 1. There is normal pulmonary vein drainage into the left atrium with ostial measurements above.  2. There is no thrombus in the left atrial appendage.  3. The esophagus runs in close proximity to the ostium of the right lower pulmonary vein.  4. No PFO/ASD.  5. Normal coronary origin. Right dominance.  6. CAC score of 452 Agatston units which is 71st percentile for age-, race-, and sex-matched controls. This suggests intermediate risk for future cardiac events.  7.  Mildly dilated aortic root, 43 mm.  TTE, 06/27/2023  1. Left ventricular ejection fraction, by estimation, is 60  to 65%. The left ventricle has normal function. The left ventricle has no regional wall motion abnormalities. Left ventricular diastolic function could not be evaluated.   2. Right ventricular systolic function is normal. The right ventricular size is normal.   3. The mitral valve is normal in structure. No evidence of mitral valve regurgitation. No evidence of mitral stenosis.   4. The aortic valve is normal in structure. Aortic valve regurgitation is not visualized. No aortic stenosis is present.   5. The inferior vena cava is normal in size with greater than 50% respiratory variability, suggesting right atrial pressure of 3 mmHg.      Assessment and Plan     #) persis AFib #) PAC S/p AF ablation 02/08/2024 by Dr. Kennyth No recurrence of Afib, no PACs on today's EKG Continue 50mg  toprol  daily  #) Hypercoag d/t persis afib #) NOAC intolerance #) upcoming LAAO w Watchman  CHA2DS2-VASc Score = at least 4 [CHF History: 1, HTN History: 1, Diabetes History: 0, Stroke History: 0, Vascular Disease History: 1, Age Score: 1, Gender Score: 0].  Therefore, the patient's annual risk of stroke is 4.8 %.    Stroke ppx - warfarin  Previously intolerant to eliquis  and xarelto  with hives, hives currently managed with daily prednisone , which needs to be weaned prior to [facility] procedure Discussed with pharmacy - will reduce prednisone  to 7.5mg  x 1 week, then 5mg  x 1 week, then 2.5mg  x 1 week, then stop Stop warfarin today, and start 81mg  ASA and 75mg  plavix  in preparation for procedure Update BMP, CBC today  #) lower extremity edema Managed with PRN lasix , continue to take daily PRN for lower extremity edema Reassess once off prednisone       Current medicines are reviewed at length with the patient today.   The patient has concerns regarding his medicines.  The following changes were made today:   STOP warfarin START 81mg  aspirin  START 75 plavix  WEAN prednisone  as instructed  Labs/ tests ordered today include:  Orders Placed This Encounter  Procedures   Basic metabolic panel with GFR   CBC   EKG 12-Lead     Disposition: Follow up with Dr. Kennyth or EP APP as usual post procedure   Signed, Chantal Needle, NP  05/17/2024  1:41 PM  Electrophysiology [facility] "

## 2024-05-17 ENCOUNTER — Other Ambulatory Visit: Payer: Self-pay

## 2024-05-17 ENCOUNTER — Encounter: Payer: Self-pay | Admitting: Cardiology

## 2024-05-17 ENCOUNTER — Ambulatory Visit: Attending: Cardiology | Admitting: Cardiology

## 2024-05-17 VITALS — BP 100/58 | HR 90 | Ht 70.0 in | Wt 233.6 lb

## 2024-05-17 DIAGNOSIS — D6869 Other thrombophilia: Secondary | ICD-10-CM

## 2024-05-17 DIAGNOSIS — I4819 Other persistent atrial fibrillation: Secondary | ICD-10-CM

## 2024-05-17 DIAGNOSIS — Z7901 Long term (current) use of anticoagulants: Secondary | ICD-10-CM | POA: Diagnosis not present

## 2024-05-17 MED ORDER — PREDNISONE 5 MG PO TABS
5.0000 mg | ORAL_TABLET | Freq: Every day | ORAL | 1 refills | Status: AC
  Filled 2024-05-17 (×2): qty 30, 30d supply, fill #0

## 2024-05-17 MED ORDER — CLOPIDOGREL BISULFATE 75 MG PO TABS: 75.0000 mg | ORAL_TABLET | Freq: Every day | ORAL | 1 refills | Status: DC

## 2024-05-17 MED ORDER — CLOPIDOGREL BISULFATE 75 MG PO TABS
75.0000 mg | ORAL_TABLET | Freq: Every day | ORAL | 1 refills | Status: AC
  Filled 2024-05-17 (×3): qty 90, 90d supply, fill #0

## 2024-05-17 MED ORDER — ASPIRIN 81 MG PO TBEC: 81.0000 mg | DELAYED_RELEASE_TABLET | Freq: Every day | ORAL | Status: AC

## 2024-05-17 MED ORDER — PREDNISONE 5 MG PO TABS: 5.0000 mg | ORAL_TABLET | Freq: Every day | ORAL | 1 refills | Status: DC

## 2024-05-17 NOTE — Patient Instructions (Signed)
 Medication Instructions:  Your physician recommends the following medication changes.  STOP TAKING: Warfarin  START TAKING: Aspirin  81 MG daily. Plavix  75 MG daily.   DECREASE:  Prednisone  Taper Schedule  January 9-15, 2026: Prednisone  7.5 mg daily  January 16-22, 2026: Prednisone  5 mg daily  January 23-29, 2026: Prednisone  2.5 mg daily  Starting June 08, 2024: Discontinue Prednisone    *If you need a refill on your cardiac medications before your next appointment, please call your pharmacy*  Lab Work: Your provider would like for you to have following labs drawn today CBC and BMP.   If you have labs (blood work) drawn today and your tests are completely normal, you will receive your results only by: MyChart Message (if you have MyChart) OR A paper copy in the mail If you have any lab test that is abnormal or we need to change your treatment, we will call you to review the results.  Testing/Procedures: No test ordered today   Follow-Up: At [facility], you and your health needs are our priority.  As part of our continuing mission to provide you with exceptional heart care, our providers are all part of one team.  This team includes your primary Cardiologist (physician) and Advanced Practice Providers or APPs (Physician Assistants and Nurse Practitioners) who all work together to provide you with the care you need, when you need it.  Your next appointment:    07/27/24 at 10:15 AM  Provider:   Suzann Riddle, NP    We recommend signing up for the patient portal called MyChart.  Sign up information is provided on this After Visit Summary.  MyChart is used to connect with patients for Virtual Visits (Telemedicine).  Patients are able to view lab/test results, encounter notes, upcoming appointments, etc.  Non-urgent messages can be sent to your provider as well.   To learn more about what you can do with MyChart, go to [email or web address].

## 2024-05-18 ENCOUNTER — Ambulatory Visit: Payer: Self-pay | Admitting: Cardiology

## 2024-05-18 LAB — CBC
Hematocrit: 41.4 % (ref 37.5–51.0)
Hemoglobin: 13.5 g/dL (ref 13.0–17.7)
MCH: 30.1 pg (ref 26.6–33.0)
MCHC: 32.6 g/dL (ref 31.5–35.7)
MCV: 92 fL (ref 79–97)
Platelets: 235 x10E3/uL (ref 150–450)
RBC: 4.49 x10E6/uL (ref 4.14–5.80)
RDW: 14.1 % (ref 11.6–15.4)
WBC: 11.8 x10E3/uL — ABNORMAL HIGH (ref 3.4–10.8)

## 2024-05-18 LAB — BASIC METABOLIC PANEL WITH GFR
BUN/Creatinine Ratio: 15 (ref 10–24)
BUN: 13 mg/dL (ref 8–27)
CO2: 26 mmol/L (ref 20–29)
Calcium: 9.6 mg/dL (ref 8.6–10.2)
Chloride: 99 mmol/L (ref 96–106)
Creatinine, Ser: 0.85 mg/dL (ref 0.76–1.27)
Glucose: 141 mg/dL — ABNORMAL HIGH (ref 70–99)
Potassium: 4.2 mmol/L (ref 3.5–5.2)
Sodium: 141 mmol/L (ref 134–144)
eGFR: 93 mL/min/1.73

## 2024-06-07 ENCOUNTER — Telehealth: Payer: Self-pay

## 2024-06-07 NOTE — Telephone Encounter (Signed)
 Left voicemail for patient to return call to review pre-procedural instructions and new arrival time of 0645.

## 2024-06-07 NOTE — Telephone Encounter (Signed)
 Spoke with patient.  Confirmed procedure date of 06/14/24. Confirmed new arrival time of 0645 for new procedure time at 0915. Reviewed pre-procedure instructions with patient. Contrast allergy? No PPM or defibrillator? No The patient understands to call if questions/concerns arise prior to procedure. The patient was grateful for call and agreed with plan.

## 2024-06-14 ENCOUNTER — Inpatient Hospital Stay (HOSPITAL_COMMUNITY): Admitting: Certified Registered Nurse Anesthetist

## 2024-06-14 ENCOUNTER — Encounter (HOSPITAL_COMMUNITY)
Admission: RE | Disposition: A | Discharge status: Home / Self Care | Payer: Self-pay | Source: Home / Self Care | Attending: Cardiology

## 2024-06-14 ENCOUNTER — Inpatient Hospital Stay (HOSPITAL_COMMUNITY)

## 2024-06-14 ENCOUNTER — Other Ambulatory Visit: Payer: Self-pay

## 2024-06-14 ENCOUNTER — Encounter (HOSPITAL_COMMUNITY): Payer: Self-pay | Admitting: Cardiology

## 2024-06-14 ENCOUNTER — Inpatient Hospital Stay (HOSPITAL_COMMUNITY)
Admission: RE | Admit: 2024-06-14 | Discharge: 2024-06-14 | Disposition: A | Discharge status: Home / Self Care | Source: Home / Self Care | Attending: Cardiology | Admitting: Cardiology

## 2024-06-14 DIAGNOSIS — I480 Paroxysmal atrial fibrillation: Secondary | ICD-10-CM

## 2024-06-14 DIAGNOSIS — I4891 Unspecified atrial fibrillation: Principal | ICD-10-CM | POA: Diagnosis present

## 2024-06-14 DIAGNOSIS — Z95818 Presence of other cardiac implants and grafts: Secondary | ICD-10-CM | POA: Insufficient documentation

## 2024-06-14 HISTORY — DX: Headache, unspecified: R51.9

## 2024-06-14 LAB — PROTIME-INR
INR: 0.9 (ref 0.8–1.2)
Prothrombin Time: 12.8 s (ref 11.4–15.2)

## 2024-06-14 LAB — SURGICAL PCR SCREEN
MRSA, PCR: NEGATIVE
Staphylococcus aureus: NEGATIVE

## 2024-06-14 LAB — TYPE AND SCREEN
ABO/RH(D): O POS
Antibody Screen: NEGATIVE

## 2024-06-14 LAB — ECHO TEE

## 2024-06-14 MED ORDER — SODIUM CHLORIDE 0.9 % IV SOLN: INTRAVENOUS | Status: DC

## 2024-06-14 MED ORDER — CEFAZOLIN SODIUM-DEXTROSE 2-4 GM/100ML-% IV SOLN
2.0000 g | INTRAVENOUS | Status: AC
  Administered 2024-06-14: 2 g via INTRAVENOUS
  Filled 2024-06-14: qty 100

## 2024-06-14 MED ORDER — HEPARIN SODIUM (PORCINE) 1000 UNIT/ML IJ SOLN
INTRAMUSCULAR | Status: DC | PRN
  Administered 2024-06-14: 4000 [IU] via INTRAVENOUS
  Administered 2024-06-14: 15000 [IU] via INTRAVENOUS

## 2024-06-14 MED ORDER — OXYCODONE HCL 5 MG PO TABS: 5.0000 mg | ORAL_TABLET | Freq: Once | ORAL | Status: DC | PRN

## 2024-06-14 MED ORDER — CLOPIDOGREL BISULFATE 75 MG PO TABS: 75.0000 mg | ORAL_TABLET | Freq: Once | ORAL | Status: AC

## 2024-06-14 MED ORDER — ASPIRIN 81 MG PO CHEW
CHEWABLE_TABLET | ORAL | Status: AC
  Filled 2024-06-14: qty 1

## 2024-06-14 MED ORDER — HEPARIN (PORCINE) IN NACL 1000-0.9 UT/500ML-% IV SOLN
INTRAVENOUS | Status: DC | PRN
  Administered 2024-06-14 (×2): 500 mL

## 2024-06-14 MED ORDER — PROPOFOL 10 MG/ML IV BOLUS
INTRAVENOUS | Status: DC | PRN
  Administered 2024-06-14: 150 mg via INTRAVENOUS

## 2024-06-14 MED ORDER — FENTANYL CITRATE (PF) 100 MCG/2ML IJ SOLN: 25.0000 ug | INTRAMUSCULAR | Status: DC | PRN

## 2024-06-14 MED ORDER — ROCURONIUM BROMIDE 10 MG/ML (PF) SYRINGE
PREFILLED_SYRINGE | INTRAVENOUS | Status: DC | PRN
  Administered 2024-06-14: 80 mg via INTRAVENOUS

## 2024-06-14 MED ORDER — ONDANSETRON HCL 4 MG/2ML IJ SOLN
INTRAMUSCULAR | Status: DC | PRN
  Administered 2024-06-14: 4 mg via INTRAVENOUS

## 2024-06-14 MED ORDER — PHENYLEPHRINE 80 MCG/ML (10ML) SYRINGE FOR IV PUSH (FOR BLOOD PRESSURE SUPPORT)
PREFILLED_SYRINGE | INTRAVENOUS | Status: DC | PRN
  Administered 2024-06-14 (×2): 80 ug via INTRAVENOUS

## 2024-06-14 MED ORDER — CLOPIDOGREL BISULFATE 75 MG PO TABS
ORAL_TABLET | ORAL | Status: AC
  Administered 2024-06-14: 75 mg via ORAL
  Filled 2024-06-14: qty 1

## 2024-06-14 MED ORDER — ONDANSETRON HCL 4 MG/2ML IJ SOLN: 4.0000 mg | Freq: Four times a day (QID) | INTRAMUSCULAR | Status: DC | PRN

## 2024-06-14 MED ORDER — FENTANYL CITRATE (PF) 100 MCG/2ML IJ SOLN
INTRAMUSCULAR | Status: AC
  Filled 2024-06-14: qty 2

## 2024-06-14 MED ORDER — SODIUM CHLORIDE 0.9% FLUSH: 3.0000 mL | Freq: Two times a day (BID) | INTRAVENOUS | Status: DC

## 2024-06-14 MED ORDER — ONDANSETRON HCL 4 MG/2ML IJ SOLN: 4.0000 mg | Freq: Once | INTRAMUSCULAR | Status: DC | PRN

## 2024-06-14 MED ORDER — PROTAMINE SULFATE 10 MG/ML IV SOLN
INTRAVENOUS | Status: DC | PRN
  Administered 2024-06-14: 10 mg via INTRAVENOUS
  Administered 2024-06-14: 20 mg via INTRAVENOUS

## 2024-06-14 MED ORDER — IODIXANOL 320 MG/ML IV SOLN
INTRAVENOUS | Status: DC | PRN
  Administered 2024-06-14: 10 mL

## 2024-06-14 MED ORDER — SODIUM CHLORIDE 0.9% FLUSH: 3.0000 mL | INTRAVENOUS | Status: DC | PRN

## 2024-06-14 MED ORDER — CHLORHEXIDINE GLUCONATE 4 % EX SOLN: Freq: Once | CUTANEOUS | Status: DC

## 2024-06-14 MED ORDER — OXYCODONE HCL 5 MG/5ML PO SOLN: 5.0000 mg | Freq: Once | ORAL | Status: DC | PRN

## 2024-06-14 MED ORDER — SODIUM CHLORIDE 0.9 % IV SOLN: 250.0000 mL | INTRAVENOUS | Status: DC | PRN

## 2024-06-14 MED ORDER — DEXAMETHASONE SOD PHOSPHATE PF 10 MG/ML IJ SOLN
INTRAMUSCULAR | Status: DC | PRN
  Administered 2024-06-14: 5 mg via INTRAVENOUS

## 2024-06-14 MED ORDER — CHLORHEXIDINE GLUCONATE 0.12 % MT SOLN
OROMUCOSAL | Status: AC
  Administered 2024-06-14: 15 mL
  Filled 2024-06-14: qty 15

## 2024-06-14 MED ORDER — ACETAMINOPHEN 10 MG/ML IV SOLN: 1000.0000 mg | Freq: Once | INTRAVENOUS | Status: DC | PRN

## 2024-06-14 MED ORDER — ACETAMINOPHEN 325 MG PO TABS: 650.0000 mg | ORAL_TABLET | ORAL | Status: DC | PRN

## 2024-06-14 MED ORDER — ASPIRIN 81 MG PO TBEC
81.0000 mg | DELAYED_RELEASE_TABLET | Freq: Every day | ORAL | Status: DC
  Administered 2024-06-14: 81 mg via ORAL

## 2024-06-14 MED ORDER — PHENYLEPHRINE HCL-NACL 20-0.9 MG/250ML-% IV SOLN
INTRAVENOUS | Status: DC | PRN
  Administered 2024-06-14: 55 ug/min via INTRAVENOUS

## 2024-06-14 MED ORDER — LIDOCAINE 2% (20 MG/ML) 5 ML SYRINGE
INTRAMUSCULAR | Status: DC | PRN
  Administered 2024-06-14: 100 mg via INTRAVENOUS

## 2024-06-14 MED ORDER — SUGAMMADEX SODIUM 200 MG/2ML IV SOLN
INTRAVENOUS | Status: DC | PRN
  Administered 2024-06-14: 200 mg via INTRAVENOUS

## 2024-06-14 MED ORDER — FENTANYL CITRATE (PF) 250 MCG/5ML IJ SOLN
INTRAMUSCULAR | Status: DC | PRN
  Administered 2024-06-14: 100 ug via INTRAVENOUS

## 2024-06-14 NOTE — Anesthesia Preprocedure Evaluation (Addendum)
"                                    Anesthesia Evaluation  Patient identified by MRN, date of birth, ID band Patient awake    Reviewed: Allergy & Precautions, NPO status , Patient's Chart, lab work & pertinent test results, reviewed documented beta blocker date and time   History of Anesthesia Complications Negative for: history of anesthetic complications  Airway Mallampati: III  TM Distance: >3 FB   Mouth opening: Limited Mouth Opening  Dental no notable dental hx.    Pulmonary neg shortness of breath, asthma , COPD,  COPD inhaler, neg recent URI, former smoker   breath sounds clear to auscultation       Cardiovascular hypertension, (-) angina (-) CAD and (-) Past MI + dysrhythmias Atrial Fibrillation  Rhythm:Regular Rate:Normal  IMPRESSIONS     1. Left ventricular ejection fraction, by estimation, is 60 to 65%. The  left ventricle has normal function. The left ventricle has no regional  wall motion abnormalities. Left ventricular diastolic function could not  be evaluated.   2. Right ventricular systolic function is normal. The right ventricular  size is normal.   3. The mitral valve is normal in structure. No evidence of mitral valve  regurgitation. No evidence of mitral stenosis.   4. The aortic valve is normal in structure. Aortic valve regurgitation is  not visualized. No aortic stenosis is present.   5. The inferior vena cava is normal in size with greater than 50%  respiratory variability, suggesting right atrial pressure of 3 mmHg.      Neuro/Psych  Headaches, neg Seizures carotid artery disease post endarterectomy    GI/Hepatic ,GERD  ,,(+) neg Cirrhosis        Endo/Other    Renal/GU Renal disease     Musculoskeletal   Abdominal   Peds  Hematology   Anesthesia Other Findings   Reproductive/Obstetrics                              Anesthesia Physical Anesthesia Plan  ASA: 3  Anesthesia Plan: General    Post-op Pain Management:    Induction: Intravenous  PONV Risk Score and Plan: 2 and Ondansetron  and Dexamethasone   Airway Management Planned: Oral ETT and Video Laryngoscope Planned  Additional Equipment:   Intra-op Plan:   Post-operative Plan: Extubation in OR  Informed Consent: I have reviewed the patients History and Physical, chart, labs and discussed the procedure including the risks, benefits and alternatives for the proposed anesthesia with the patient or authorized representative who has indicated his/her understanding and acceptance.     Dental advisory given  Plan Discussed with: CRNA  Anesthesia Plan Comments:          Anesthesia Quick Evaluation  "

## 2024-06-14 NOTE — Discharge Summary (Cosign Needed)
 "   Electrophysiology Discharge Summary   Patient ID: Charles Bryant,  MRN: [id number], DOB/AGE: [birth year] 69 y.o.  Admit date: 06/14/2024 Discharge date: 06/14/2024  Primary Care Physician: Melvin Pao, NP  Primary Cardiologist: Dorn Lesches, MD  Electrophysiologist: Fonda Kitty, MD     Primary Discharge Diagnosis:  Persistent Atrial Fibrillation Secondary hypercoagulable state  Poor candidacy for long term anticoagulation due to allergy to Eliquis  with hives and coumadin   Secondary Discharge Diagnosis:  CAD  Carotid Artery Disease  HTN  HLD  COPD   Procedures This Admission:  Transeptal Puncture Intra-procedural TEE which showed no LAA thrombus Left atrial appendage occlusive device placement on 06/14/24 by Dr. Kitty    This study demonstrated: 1.Successful implantation of a 27mm WATCHMAN left atrial appendage occlusive device.  2.TEE demonstrating no LAA thrombus. 3.No early apparent complications.    Post Implant Anticoagulation Strategy: Continue aspirin  and clopidogrel  for 6 months, then transition to aspirin  alone. Repeat imaging with CT scan in 60 days.       Brief HPI: Charles Bryant is a 69 y.o. male with a history of Persistent Atrial Fibrillation who was referred to Electrophysiology in the outpatient setting     Hospital Course:  The patient was admitted and underwent left atrial appendage occlusive device placement as above.  The patient was monitored in the post procedure setting and has done very well with no concerns. Given this, he/she is being considered for same day discharge later today. Groin site has been stable without evidence of hematoma or bleeding. Wound care and restrictions were reviewed with the patient.   The patient has been scheduled for post procedure follow up with EP APP in approximately 6 weeks. They will restart ASA 81 mg + Plavix  75mg  daily this evening and continue for to complete 6 months of medical therapy (12/12/24). They will  require dental SBE for 6 month post op and should refrain from dental work or cleanings for the first 45 days post implant. SBE to be RXd at follow up.   A repeat CT scan will be performed in approximately 60 days to ensure proper seal of the device.    Physical Exam: Vitals:   06/14/24 1400 06/14/24 1405 06/14/24 1515 06/14/24 1545  BP: 133/75 136/69 134/76 (!) 150/83  Pulse: 94 93 (!) 106 (!) 106  Resp: 17 17 18 17   Temp:      TempSrc:      SpO2: 91% 90% 93% 93%  Weight:      Height:        GEN: Well nourished, well developed in no acute distress NECK: No JVD; No carotid bruits CARDIAC: Regular rate and rhythm, no murmurs, rubs, gallops RESPIRATORY:  Clear to auscultation without rales, wheezing or rhonchi  ABDOMEN: Soft, non-tender, non-distended EXTREMITIES:  No edema; No deformity. Groin site Stable    Tele: ST 100's    Discharge Medications:  Allergies as of 06/14/2024       Reactions   Eliquis  [apixaban ] Hives   Pradaxa  [dabigatran  Etexilate Mesylate] Hives   Xarelto  [rivaroxaban ] Hives        Medication List     TAKE these medications    amLODipine  10 MG tablet Commonly known as: NORVASC  TAKE 1 TABLET BY MOUTH DAILY   aspirin  EC 81 MG tablet Take 1 tablet (81 mg total) by mouth daily. Swallow whole.   atorvastatin  40 MG tablet Commonly known as: LIPITOR TAKE 1 TABLET BY MOUTH DAILY   cetirizine 10 MG tablet  Commonly known as: ZYRTEC Take 20 mg by mouth at bedtime.   clobetasol  cream 0.05 % Commonly known as: TEMOVATE  Apply 1 Application topically 2 (two) times daily. What changed:  when to take this reasons to take this   clopidogrel  75 MG tablet Commonly known as: Plavix  Take 1 tablet (75 mg total) by mouth daily.   CO Q 10 PO Take 400 mg by mouth daily.   famotidine  20 MG tablet Commonly known as: PEPCID  TAKE 1 TABLET BY MOUTH TWICE A DAY   fluticasone -salmeterol 500-50 MCG/ACT Aepb Commonly known as: Wixela Inhub USE 1  INHALATION BY MOUTH  TWICE DAILY   furosemide  20 MG tablet Commonly known as: LASIX  TAKE 1 TABLET (20 MG TOTAL) BY MOUTH DAILY AS NEEDED (SWELLING/WEIGHT GAIN). What changed: See the new instructions.   hydrochlorothiazide  12.5 MG tablet Commonly known as: HYDRODIURIL  TAKE 1 TABLET BY MOUTH DAILY   ipratropium-albuterol  0.5-2.5 (3) MG/3ML Soln Commonly known as: DUONEB Take 3 mLs by nebulization every 6 (six) hours as needed.   metoprolol  succinate 50 MG 24 hr tablet Commonly known as: TOPROL -XL TAKE 1 TABLET BY MOUTH DAILY  WITH OR IMMEDIATELY FOLLOWING A  MEAL   MULTIVITAMIN PO Take 1 tablet by mouth daily.   niacin 500 MG tablet Commonly known as: (VITAMIN B3) Take 500 mg by mouth daily.   omeprazole  40 MG capsule Commonly known as: PRILOSEC Take 1 capsule (40 mg total) by mouth daily.   predniSONE  5 MG tablet Commonly known as: DELTASONE  Take 1 tablet (5 mg total) by mouth daily with breakfast. Per Clinic instructions What changed:  how much to take additional instructions   Ventolin  HFA 108 (90 Base) MCG/ACT inhaler Generic drug: albuterol  USE 2 INHALATIONS BY MOUTH EVERY 6 HOURS AS NEEDED FOR WHEEZING  OR SHORTNESS OF BREATH   VITAMIN B COMPLEX PO Take 1 tablet by mouth daily.   VITAMIN D PO Take 1 tablet by mouth daily.        Disposition:  Home with usual follow up as in AVS  Duration of Discharge Encounter:  APP Time: 28 minutes  Signed, Daphne Barrack, NP-C, AGACNP-BC [facility] - Electrophysiology  06/14/2024, 4:43 PM     Pt was admitted for planned inpatient only procedure.   Inpatient bed request. Due to elevated census, pts recovery time expired while in recovery area, prior to floor bed being ready.  Discussed with MD and staff and felt appropriate to discharge patient from recovery area, given stable vitals, exam, and expiration of bed rest.   Usual follow up in place. Teaching reviewed with patient and placed in AVS.      "

## 2024-06-14 NOTE — H&P (Addendum)
 "  Electrophysiology Note:   Date:  06/14/24  ID:  [name], DOB Nov 25, 1955, MRN [id number]   Primary Cardiologist: Dorn Lesches, MD Electrophysiologist: Fonda Kitty, MD       History of Present Illness:   Charles Bryant is a 69 y.o. male with h/o carotid artery disease post endarterectomy in 2008, coronary disease, tobacco abuse, hypertension, hyperlipidemia, COPD, and persistent atrial fibrillation who is being seen today for evaluation for Watchman and ablation.    Discussed the use of AI scribe software for clinical note transcription with the patient, who gave verbal consent to proceed.   History of Present Illness He has a history of atrial fibrillation, initially detected by his iWatch and later confirmed with a twelve-lead ECG. He has been in atrial fibrillation for approximately six months, with the first episode occurring in January. He self-converted from afib once. He has experienced significant adverse reactions to blood thinners. An allergic reaction to Eliquis  resulted in an autoimmune response with thirty percent of his body covered in hives. He is currently on Pradaxa  but suspects it may also be causing a mild reaction. Due to these reactions and concerns about bleeding risks, he is interested in the Watchman procedure to avoid long-term use of blood thinners. He reports some swelling in his legs and has Lasix  available for use if needed. This has developed since he went into AF.    Interval: Patient presents today for planned Watchman implant. Reports feeling relatively well. No episodes of AF since ablation. No new or acute complaints.   Review of systems complete and found to be negative unless listed in HPI.    EP Information / Studies Reviewed:     EKG is not ordered today. EKG from 08/08/23 reviewed which showed AF.       Echo 06/27/23:   1. Left ventricular ejection fraction, by estimation, is 60 to 65%. The  left ventricle has normal function. The left ventricle  has no regional  wall motion abnormalities. Left ventricular diastolic function could not  be evaluated.   2. Right ventricular systolic function is normal. The right ventricular  size is normal.   3. The mitral valve is normal in structure. No evidence of mitral valve  regurgitation. No evidence of mitral stenosis.   4. The aortic valve is normal in structure. Aortic valve regurgitation is  not visualized. No aortic stenosis is present.   5. The inferior vena cava is normal in size with greater than 50%  respiratory variability, suggesting right atrial pressure of 3 mmHg.      Risk Assessment/Calculations:     CHA2DS2-VASc Score = 4   This indicates a 4.8% annual risk of stroke. The patient's score is based upon: CHF History: 1 HTN History: 1 Diabetes History: 0 Stroke History: 0 Vascular Disease History: 1 Age Score: 1 Gender Score: 0               Physical Exam:    Today's Vitals   06/14/24 0709  BP: (!) 168/90  Pulse: 91  Resp: 20  Temp: 98 F (36.7 C)  TempSrc: Oral  SpO2: 96%  Weight: 98.4 kg  Height: 5' 10 (1.778 m)   Body mass index is 31.14 kg/m.  GEN: Well nourished, well developed in no acute distress NECK: No JVD; No carotid bruits CARDIAC: Normal rate, regular rhythm RESPIRATORY:  Clear to auscultation without rales, wheezing or rhonchi  ABDOMEN: Soft, non-tender, non-distended EXTREMITIES:  1+ edema; No deformity  ASSESSMENT AND PLAN:   I have seen Charles Bryant in the office today who is being considered for a Watchman left atrial appendage closure device. I believe they will benefit from this procedure given their history of atrial fibrillation, CHA2DS2-VASc score of 3 and unadjusted ischemic stroke rate of 3.2% per year. Unfortunately, the patient is not felt to be a long term anticoagulation candidate secondary to adverse allergic reactions to multiple oral anti-coagulants and bleeding. The patient's chart has been reviewed and I feel that  they would be a candidate for short term oral anticoagulation after Watchman implant.    It is my belief that after undergoing a LAA closure procedure, Charles Bryant will not need long term anticoagulation which eliminates anticoagulation side effects and major bleeding risk.    Procedural risks for the Watchman implant have been reviewed with the patient including a 0.5% risk of stroke, <1% risk of perforation and <1% risk of device embolization. Other risks include bleeding, vascular damage, tamponade, worsening renal function, and death. The patient understands these risk and wishes to proceed.       The published clinical data on the safety and effectiveness of WATCHMAN include but are not limited to the following: - Holmes DR, Jess BEARD, Sick P et al. for the PROTECT AF Investigators. Percutaneous closure of the left atrial appendage versus warfarin therapy for prevention of stroke in patients with atrial fibrillation: a randomised non-inferiority trial. Lancet 2009; 374: 534-42. GLENWOOD Jess BEARD, Doshi SK, Jonita VEAR Satchel D et al. on behalf of the PROTECT AF Investigators. Percutaneous Left Atrial Appendage Closure for Stroke Prophylaxis in Patients With Atrial Fibrillation 2.3-Year Follow-up of the PROTECT AF (Watchman Left Atrial Appendage System for Embolic Protection in Patients With Atrial Fibrillation) Trial. Circulation 2013; 127:720-729. - Alli O, Doshi S,  Kar S, Reddy VY, Sievert H et al. Quality of Life Assessment in the Randomized PROTECT AF (Percutaneous Closure of the Left Atrial Appendage Versus Warfarin Therapy for Prevention of Stroke in Patients With Atrial Fibrillation) Trial of Patients at Risk for Stroke With Nonvalvular Atrial Fibrillation. J Am Coll Cardiol 2013; 61:1790-8. GLENWOOD Satchel DR, Archer RAMAN, Price M, Whisenant B, Sievert H, Doshi S, Huber K, Reddy V. Prospective randomized evaluation of the Watchman left atrial appendage Device in patients with atrial fibrillation versus long-term  warfarin therapy; the PREVAIL trial. Journal of the [employer] of Cardiology, Vol. 4, No. 1, 2014, 1-11. - Kar S, Doshi SK, Sadhu A, Horton R, Osorio J et al. Primary outcome evaluation of a next-generation left atrial appendage closure device: results from the PINNACLE FLX trial. Circulation 2021;143(18)1754-1762.    HAS-BLED score: 3 Hypertension Yes  Abnormal renal and liver function (Dialysis, transplant, Cr >2.26 mg/dL /Cirrhosis or Bilirubin >2x Normal or AST/ALT/AP >3x Normal) No  Stroke No  Bleeding Yes  Labile INR (Unstable/high INR) No  Elderly (>65) Yes  Drugs or alcohol (>= 8 drinks/week, anti-plt or NSAID) No    CHA2DS2-VASc Score = 4  The patient's score is based upon: CHF History: 1 - NYHA class II HTN History: 1 Diabetes History: 0 Stroke History: 0 Vascular Disease History: 1 Age Score: 1 Gender Score: 0         ASSESSMENT AND PLAN: #. Persistent Atrial Fibrillation: Associated with diastolic heart failure. For this reason, we have prioritized a rhythm control strategy. Underwent ablation on 02/08/24. -Continue metoprolol  XL 50mg  daily.   #. Secondary Hypercoagulable State  - Did not tolerate Eliquis , Pradaxa  or Xarelto .  Was able to tolerate warfarin somewhat better. Once he was 3 months out from ablation, he was changed to DAPT. Will continue for DAPT for 6 months.   Risks and benefits of Watchman and TEE readdressed today. Patient voiced understanding and elected to proceed.   Signed, Fonda Kitty, MD      "

## 2024-06-14 NOTE — Transfer of Care (Signed)
 Immediate Anesthesia Transfer of Care Note  Patient: Charles Bryant  Procedure(s) Performed: LEFT ATRIAL APPENDAGE OCCLUSION TRANSESOPHAGEAL ECHOCARDIOGRAM  Patient Location: PACU and Short Stay  Anesthesia Type:General  Level of Consciousness: awake, alert , and oriented  Airway & Oxygen Therapy: Patient Spontanous Breathing and Patient connected to face mask oxygen  Post-op Assessment: Report given to RN and Post -op Vital signs reviewed and stable  Post vital signs: Reviewed and stable  Last Vitals:  Vitals Value Taken Time  BP    Temp    Pulse 87 06/14/24 11:49  Resp    SpO2 90 % 06/14/24 11:49  Vitals shown include unfiled device data.  Last Pain:  Vitals:   06/14/24 0727  TempSrc:   PainSc: 0-No pain      Patients Stated Pain Goal: 0 (06/14/24 0727)  Complications: No notable events documented.

## 2024-06-14 NOTE — Anesthesia Procedure Notes (Signed)
 Procedure Name: Intubation Date/Time: 06/14/2024 10:37 AM  Performed by: Mannie Krystal LABOR, CRNAPre-anesthesia Checklist: Patient identified, Emergency Drugs available, Suction available and Patient being monitored Patient Re-evaluated:Patient Re-evaluated prior to induction Oxygen Delivery Method: Circle system utilized Preoxygenation: Pre-oxygenation with 100% oxygen Induction Type: IV induction Ventilation: Mask ventilation without difficulty Laryngoscope Size: Glidescope and 4 Grade View: Grade I Tube type: Oral Tube size: 7.5 mm Number of attempts: 1 Airway Equipment and Method: Stylet and Oral airway Placement Confirmation: ETT inserted through vocal cords under direct vision, positive ETCO2 and breath sounds checked- equal and bilateral Secured at: 22 cm Tube secured with: Tape Dental Injury: Teeth and Oropharynx as per pre-operative assessment

## 2024-06-14 NOTE — Anesthesia Postprocedure Evaluation (Signed)
"   Anesthesia Post Note  Patient: Charles Bryant  Procedure(s) Performed: LEFT ATRIAL APPENDAGE OCCLUSION TRANSESOPHAGEAL ECHOCARDIOGRAM     Patient location during evaluation: PACU Anesthesia Type: General Level of consciousness: awake and alert Pain management: pain level controlled Vital Signs Assessment: post-procedure vital signs reviewed and stable Respiratory status: spontaneous breathing, nonlabored ventilation, respiratory function stable and patient connected to nasal cannula oxygen Cardiovascular status: blood pressure returned to baseline and stable Postop Assessment: no apparent nausea or vomiting Anesthetic complications: no   No notable events documented.  Last Vitals:  Vitals:   06/14/24 1255 06/14/24 1300  BP: 131/75 131/84  Pulse: 91 89  Resp: (!) 25 18  Temp:    SpO2: 94% 91%    Last Pain:  Vitals:   06/14/24 1300  TempSrc:   PainSc: 0-No pain                 Lynwood MARLA Cornea      "

## 2024-06-14 NOTE — Discharge Instructions (Signed)
 " WATCHMAN Procedure, Care After  Procedure MD: Dr. Kennyth Olds Clinical Coordinator: Edsel Greek, RN and Rockie Redman, RN  This sheet gives you information about how to care for yourself after your procedure. Your health care provider may also give you more specific instructions. If you have problems or questions, contact your health care provider.  What can I expect after the procedure? After the procedure, it is common to have: Bruising around your puncture site. Tenderness around your puncture site. Tiredness (fatigue).  Medication instructions It is very important to continue to take your blood thinner as directed by your doctor after the Watchman procedure. Call your procedure doctors office with question or concerns. If you are on Coumadin (warfarin), you will have your INR checked the week after your procedure, with a goal INR of 2.0 - 3.0. Please follow your medication instructions on your discharge summary. Only take the medications listed on your discharge paperwork.  Follow up You will be seen in 6 weeks after your procedure You will have a repeat CT scan or Echocardiogram approximately 8 weeks after your procedure mark to check your device You will follow up the MD/APP who performed your procedure 6 months after your procedure The Watchman Clinical Coordinator will check in with you from time to time, including 1 and 2 years after your procedure.  NO DENTAL CLEANINGS FOR 45 days. After that, you will require antibiotics for dental procedures the first 6 months.   Follow these instructions at home: Puncture site care  Follow instructions from your health care provider about how to take care of your puncture site. Make sure you: If present, leave stitches (sutures), skin glue, or adhesive strips in place.  If a large square bandage is present, this may be removed 24 hours after surgery.  Check your puncture site every day for signs of infection. Check for: Redness,  swelling, or pain. Fluid or blood. If your puncture site starts to bleed, lie down on your back, apply firm pressure to the area, and contact your health care provider. Warmth. Pus or a bad smell. Driving Do not drive yourself home if you received sedation Do not drive for at least 4 days after your procedure or however long your health care provider recommends. (Do not resume driving if you have previously been instructed not to drive for other health reasons.) Do not spend greater than 1 hour at a time in a car for the first 3 days. Stop and take a break with a 5 minute walk at least every hour.  Do not drive or use heavy machinery while taking prescription pain medicine.  Activity Avoid activities that take a lot of effort, including exercise, for at least 7 days after your procedure. For the first 3 days, avoid sitting for longer than one hour at a time.  Avoid alcoholic beverages, signing paperwork, or participating in legal proceedings for 24 hours after receiving sedation Do not lift anything that is heavier than 10 lb (4.5 kg) for one week.  No sexual activity for 1 week.  Return to your normal activities as told by your health care provider. Ask your health care provider what activities are safe for you. General instructions Take over-the-counter and prescription medicines only as told by your health care provider. Do not use any products that contain nicotine or tobacco, such as cigarettes and e-cigarettes. If you need help quitting, ask your health care provider. You may shower after 24 hours, but Do not take baths, swim,  or use a hot tub for 1 week.  Do not drink alcohol for 24 hours after your procedure. Keep all follow-up visits as told by your health care provider. This is important. Dental Work: You will require antibiotics prior to any dental work, including cleanings, for 6 months after your Watchman implantation to help protect you from infection. After 6 months, antibiotics  are no longer required. Contact a health care provider if: You have redness, mild swelling, or pain around your puncture site. You have soreness in your throat or at your puncture site that does not improve after several days You have fluid or blood coming from your puncture site that stops after applying firm pressure to the area. Your puncture site feels warm to the touch. You have pus or a bad smell coming from your puncture site. You have a fever. You have chest pain or discomfort that spreads to your neck, jaw, or arm. You are sweating a lot. You feel nauseous. You have a fast or irregular heartbeat. You have shortness of breath. You are dizzy or light-headed and feel the need to lie down. You have pain or numbness in the arm or leg closest to your puncture site. Get help right away if: Your puncture site suddenly swells. Your puncture site is bleeding and the bleeding does not stop after applying firm pressure to the area. These symptoms may represent a serious problem that is an emergency. Do not wait to see if the symptoms will go away. Get medical help right away. Call your local emergency services (911 in the [place] Do not drive yourself to the hospital. Summary After the procedure, it is normal to have bruising and tenderness at the puncture site in your groin, neck, or forearm. Check your puncture site every day for signs of infection. Get help right away if your puncture site is bleeding and the bleeding does not stop after applying firm pressure to the area. This is a medical emergency.  This information is not intended to replace advice given to you by your health care provider. Make sure you discuss any questions you have with your health care provider.   "

## 2024-06-14 NOTE — Progress Notes (Signed)
" °  Pt was admitted for planned inpatient only procedure.   Inpatient bed request. Due to elevated census, pts recovery time expired while in recovery area, prior to floor bed being ready.  Discussed with MD and staff and felt appropriate to discharge patient from recovery area, given stable vitals, exam, and expiration of bed rest.   Usual follow up in place. Teaching reviewed with patient and placed in AVS.     Daphne Barrack, NP-C, AGACNP-BC [facility] - Electrophysiology  06/14/2024, 4:43 PM  "

## 2024-06-15 ENCOUNTER — Telehealth: Payer: Self-pay

## 2024-06-15 DIAGNOSIS — I4819 Other persistent atrial fibrillation: Secondary | ICD-10-CM

## 2024-06-15 DIAGNOSIS — Z95818 Presence of other cardiac implants and grafts: Secondary | ICD-10-CM

## 2024-06-15 LAB — POCT ACTIVATED CLOTTING TIME: Activated Clotting Time: 261 s

## 2024-06-15 NOTE — Telephone Encounter (Signed)
" °  [facility]   Watch[facility] Team  Contacted the patient regarding discharge from [facility] on 06/14/2024.  The patient understands to follow up with Suzann Riddle on 07/27/2024.  The patient understands discharge instructions? Yes  The patient understands medications and regimen? Yes   The patient reports groin sites are healing well with no pain, swelling or drainage.  Scheduled post procedure imaging on 08/10/2024.  Reiterated to patient to avoid dental visits prior to their upcoming follow-up appointment. Instructed them to call after that visit if they have a dental visit so antibiotics may be called in. Reiterated they will need dental SBE through 6 months post-procedure.  The patient understands to call with any questions or concerns prior to scheduled visit.   The patient was grateful for call and agreed with plan.   "

## 2024-06-19 ENCOUNTER — Ambulatory Visit: Admitting: Nurse Practitioner

## 2024-07-27 ENCOUNTER — Ambulatory Visit: Admitting: Cardiology

## 2024-08-10 ENCOUNTER — Other Ambulatory Visit (HOSPITAL_COMMUNITY)

## 2024-08-13 ENCOUNTER — Ambulatory Visit: Admitting: Nurse Practitioner
